# Patient Record
Sex: Male | Born: 1955
Health system: Southern US, Community
[De-identification: ages and names within clinical notes are randomized; demographics above are authoritative.]

## PROBLEM LIST (undated history)

## (undated) DIAGNOSIS — R0989 Other specified symptoms and signs involving the circulatory and respiratory systems: Secondary | ICD-10-CM

## (undated) DIAGNOSIS — I6529 Occlusion and stenosis of unspecified carotid artery: Secondary | ICD-10-CM

## (undated) DIAGNOSIS — E785 Hyperlipidemia, unspecified: Secondary | ICD-10-CM

## (undated) DIAGNOSIS — I10 Essential (primary) hypertension: Secondary | ICD-10-CM

## (undated) DIAGNOSIS — E119 Type 2 diabetes mellitus without complications: Secondary | ICD-10-CM

## (undated) DIAGNOSIS — E669 Obesity, unspecified: Secondary | ICD-10-CM

## (undated) HISTORY — DX: Occlusion and stenosis of unspecified carotid artery: I65.29

## (undated) HISTORY — DX: Hyperlipidemia, unspecified: E78.5

## (undated) HISTORY — DX: Obesity, unspecified: E66.9

## (undated) HISTORY — DX: Essential (primary) hypertension: I10

## (undated) HISTORY — DX: Other specified symptoms and signs involving the circulatory and respiratory systems: R09.89

## (undated) HISTORY — DX: Type 2 diabetes mellitus without complications: E11.9

---

## 1987-03-09 HISTORY — PX: FRACTURE SURGERY: SHX138

## 1987-03-09 HISTORY — PX: OTHER SURGICAL HISTORY: SHX169

## 1987-03-09 HISTORY — PX: URINARY SURGERY: SHX2626

## 2000-12-13 ENCOUNTER — Other Ambulatory Visit: Admission: RE | Admit: 2000-12-13 | Discharge: 2000-12-13 | Payer: Self-pay | Admitting: Otolaryngology

## 2012-06-01 ENCOUNTER — Encounter: Payer: Self-pay | Admitting: Pharmacist

## 2012-06-01 ENCOUNTER — Ambulatory Visit (INDEPENDENT_AMBULATORY_CARE_PROVIDER_SITE_OTHER): Payer: BC Managed Care – PPO | Admitting: Pharmacist

## 2012-06-01 VITALS — BP 158/72 | HR 75 | Ht 73.0 in | Wt 307.0 lb

## 2012-06-01 DIAGNOSIS — E119 Type 2 diabetes mellitus without complications: Secondary | ICD-10-CM

## 2012-06-01 DIAGNOSIS — E785 Hyperlipidemia, unspecified: Secondary | ICD-10-CM

## 2012-06-01 NOTE — Progress Notes (Signed)
Diabetes Flow Sheet:  Visit 1  Chief Complaint:   Chief Complaint  Patient presents with  . Diabetes     Exam Polyuria:  neg Polydipsia:  neg Polyphagia:  Neg    BMI:  Body mass index is 40.51 kg/(m^2).   General Appearance:  obese Mood/Affect:  normal  Low fat/carbohydrate diet?  No Nicotine Abuse?  No Medication Compliance?  No Exercise?  No Alcohol Abuse?  No  Patient check BG at home twice a day  - didn't bring glucometer but reports BG is 170, 112, 203 recently   Last A1C 7.1% (03/22/12) Last Lipid Panel (03/22/12)       LDL - 111  HDL-48   TG-176   Medication Checklist: ACE Inhibitor/ARB?  Yes Lipid Lowering Agent?  No Aspirin?  Yes Oral Hypoglycemic Agent(s)?  Yes  Assessment: 1.  type 2 Diabetes.   2.  Blood Pressure Control.  Uncontrolled - questionable white coat HTN 3.  Lipid Control.  uncontrolled  Recommendations: 1.  Carbohydrate counting diet.  Patient is counseled extensively on carbohydrate counting, serving sizes, saturated fat intake and meal planning.  Patient is instructed to eat 3 meals a day and 3 small snacks.  Patient will supplement snacks based on physical activity. 2.  30 minutes of physical activity.  Patient is counseled to always carry glucose tablets, lifesavers, hard candies, etc., while exercising in case of hypoglycemic event. 3.  Patient is counseled on pathophysiology of diabetes and the risk of long-term complications.  Fasting blood glucose goals are 80-120mg /dL.  Post-prandial goals are < 180.  A1C goals < 7.0%. 4.  LDL goal of < 100, HDL > 40 and TG < 150 5.  Patient is counseled on proper use of glucometer and lancing device.  Patient is informed to check BG twice daily and how to respond to unsuitable results. 6.  Medication recommendations at this time are as follows:  Restart crestor 10mg  qd (gave samples for 6 weeks) 7.  Discusses both Bydureon and Invokana - will try diet changes for 4 weeks, if BG still elelvated then will add  one of these agents. RTC - 4 weeks - LABS DUE

## 2012-06-29 ENCOUNTER — Ambulatory Visit (INDEPENDENT_AMBULATORY_CARE_PROVIDER_SITE_OTHER): Payer: BC Managed Care – PPO | Admitting: Pharmacist

## 2012-06-29 VITALS — BP 152/80 | HR 75 | Ht 73.0 in | Wt 310.0 lb

## 2012-06-29 DIAGNOSIS — R7989 Other specified abnormal findings of blood chemistry: Secondary | ICD-10-CM

## 2012-06-29 DIAGNOSIS — E291 Testicular hypofunction: Secondary | ICD-10-CM

## 2012-06-29 DIAGNOSIS — E1169 Type 2 diabetes mellitus with other specified complication: Secondary | ICD-10-CM

## 2012-06-29 DIAGNOSIS — E119 Type 2 diabetes mellitus without complications: Secondary | ICD-10-CM

## 2012-06-29 DIAGNOSIS — E785 Hyperlipidemia, unspecified: Secondary | ICD-10-CM

## 2012-06-29 DIAGNOSIS — I10 Essential (primary) hypertension: Secondary | ICD-10-CM

## 2012-06-29 LAB — POCT GLYCOSYLATED HEMOGLOBIN (HGB A1C): Hemoglobin A1C: 7.6

## 2012-06-29 LAB — POCT UA - MICROALBUMIN: Microalbumin Ur, POC: NEGATIVE mg/dL

## 2012-06-29 MED ORDER — CANAGLIFLOZIN 100 MG PO TABS: 100.0000 mg | ORAL_TABLET | Freq: Every day | ORAL | Status: DC

## 2012-06-29 NOTE — Progress Notes (Signed)
Diabetes Follow-Up Visit Chief Complaint:   Chief Complaint  Patient presents with  . Diabetes     Filed Vitals:   06/29/12 0815  BP: 152/80  Pulse: 75    Per patient home BP readings are 130's/80's   HPI: Patient was last seen 06/01/2012 for uncontrolled type 2 DM.  We discussed diet changes.   We also discussed Invokana and Bydureon as possible add on medications if diet changes did not bring BG to goals.   Current Diabetes Medications:  glipizide (generic) 5mg  qam and Janumet 50/1000mg  1 tablet twice a day with meals  Exam Edema:  neg  Polyuria:  neg  Polydipsia:  neg Polyphagia:  neg  BMI:  Body mass index is 40.91 kg/(m^2).   Weight changes:  Increased by 3# General Appearance:  alert, oriented, no acute distress and obese Mood/Affect:  normal   Low fat/carbohydrate diet?  Yes - switched to sweet potatoes, increase in salads - uses thousand island dressing, limiting sweets and sugar intake.  Snacks on apples and peanuts. Nicotine Abuse?  No Medication Compliance?  Yes Exercise?  No Alcohol Abuse?  No  Home BG Monitoring:  Checking 1-2 times a day. Average:  160's High: 203  Low:  112    Lab Results  Component Value Date   HGBA1C 7.6 06/29/2012    No results found for this basename: MICROALBUR,  MALB24HUR    No results found for this basename: CHOL,  HDL,  LDLCALC,  LDLDIRECT,  TRIG,  CHOLHDL      Assessment: 1.  Diabetes.  Uncontrolled - better but still not at BG goals 2.  Blood Pressure.  Elevated in office but normal at home 3.  Lipids.  Lipid panel pending   Recommendations: 1.  Medication recommendations at this time are as follows:  Add invokana 100mg  1 tablet each morning. (recheck BMET in 1 month - if Scr normal will consider increase Invokana to 300mg  daily)   Continue JanuMet 50/1000mg  bid and glipizide 5mg  qam (will try to discontinue in future) 2.  Reviewed HBG goals:  Fasting 80-130 and 1-2 hour post prandial <180.  Patient is instructed to  check BG 2 times per day.    3.  BP goal < 140/80. 4.  LDL goal of < 100, HDL > 40 and TG < 150. 5.  Eye Exam yearly and Dental Exam every 6 months. 6.  Dietary recommendations:  Discussed choosing low fat dressing (vinegarettes) and other low fat foods.  Increase non starchy vegetables 7.  Physical Activity recommendations:  Try to increase - goal 30 -45 minutes daily   8.  Return to clinic in 4-6 wks   Time spent counseling patient:  30 minutes   Henrene Pastor, PharmD, CPP

## 2012-06-30 LAB — NMR LIPOPROFILE WITH LIPIDS
Cholesterol, Total: 113 mg/dL (ref <200)
HDL Particle Number: 29.2 umol/L — ABNORMAL LOW (ref >=30.5)
LDL (calc): 41 mg/dL (ref <100)
LP-IR Score: 49 — ABNORMAL HIGH (ref <=45)
Small LDL Particle Number: 296 nmol/L (ref <=527)

## 2012-06-30 LAB — BASIC METABOLIC PANEL WITH GFR
CO2: 29 mEq/L (ref 19–32)
Calcium: 9.5 mg/dL (ref 8.4–10.5)
Creat: 0.81 mg/dL (ref 0.50–1.35)
GFR, Est African American: >89 mL/min
Sodium: 137 mEq/L (ref 135–145)

## 2012-07-04 ENCOUNTER — Telehealth: Payer: Self-pay | Admitting: Pharmacist

## 2012-07-04 NOTE — Telephone Encounter (Signed)
Tried to call patient with lab result - LM for him to call office for results

## 2012-07-05 NOTE — Telephone Encounter (Signed)
Laboratory results given to patient

## 2012-07-28 ENCOUNTER — Other Ambulatory Visit (INDEPENDENT_AMBULATORY_CARE_PROVIDER_SITE_OTHER): Payer: BC Managed Care – PPO

## 2012-07-28 DIAGNOSIS — E119 Type 2 diabetes mellitus without complications: Secondary | ICD-10-CM

## 2012-07-28 NOTE — Progress Notes (Signed)
Patient in today for labs only. °

## 2012-08-08 ENCOUNTER — Telehealth: Payer: Self-pay | Admitting: Pharmacist

## 2012-08-08 ENCOUNTER — Other Ambulatory Visit (INDEPENDENT_AMBULATORY_CARE_PROVIDER_SITE_OTHER): Payer: BC Managed Care – PPO

## 2012-08-08 DIAGNOSIS — R7989 Other specified abnormal findings of blood chemistry: Secondary | ICD-10-CM

## 2012-08-08 NOTE — Progress Notes (Signed)
Our records show that patient had labs drawn 1 week ago but our laboratory can find nothing resulted.  [facility] is to call patient to have him RTC to have redrawn.

## 2012-08-08 NOTE — Progress Notes (Signed)
Patient came in for labs only.

## 2012-08-09 ENCOUNTER — Telehealth: Payer: Self-pay | Admitting: Pharmacist

## 2012-08-09 DIAGNOSIS — E119 Type 2 diabetes mellitus without complications: Secondary | ICD-10-CM

## 2012-08-09 LAB — BASIC METABOLIC PANEL WITH GFR
BUN: 17 mg/dL (ref 6–23)
Chloride: 96 mEq/L (ref 96–112)
GFR, Est Non African American: 71 mL/min
Potassium: 3.8 mEq/L (ref 3.5–5.3)
Sodium: 139 mEq/L (ref 135–145)

## 2012-08-09 MED ORDER — CANAGLIFLOZIN 100 MG PO TABS: 100.0000 mg | ORAL_TABLET | Freq: Every day | ORAL | Status: DC

## 2012-08-09 NOTE — Telephone Encounter (Signed)
Results of BMP discussed with patient.  He reports that BG has improved since starting Invokana 100mg  daily.  Will continue with Invokana 100mg  daily for now - in future may increase to 300mg  daily.

## 2012-08-09 NOTE — Telephone Encounter (Signed)
Patient called regarding laboratory results.

## 2012-09-03 ENCOUNTER — Other Ambulatory Visit: Payer: Self-pay | Admitting: Family Medicine

## 2012-09-19 ENCOUNTER — Other Ambulatory Visit: Payer: Self-pay | Admitting: *Deleted

## 2012-09-19 MED ORDER — LOSARTAN POTASSIUM-HCTZ 100-25 MG PO TABS: 1.0000 | ORAL_TABLET | Freq: Every day | ORAL | Status: DC

## 2012-09-19 NOTE — Telephone Encounter (Signed)
LAST OV 4/14 WITH PHARMACIST.

## 2012-10-04 ENCOUNTER — Other Ambulatory Visit: Payer: Self-pay | Admitting: Family Medicine

## 2012-10-05 ENCOUNTER — Ambulatory Visit (INDEPENDENT_AMBULATORY_CARE_PROVIDER_SITE_OTHER): Payer: BC Managed Care – PPO | Admitting: Pharmacist

## 2012-10-05 VITALS — BP 160/82 | HR 76 | Ht 73.0 in | Wt 308.0 lb

## 2012-10-05 DIAGNOSIS — E119 Type 2 diabetes mellitus without complications: Secondary | ICD-10-CM

## 2012-10-05 DIAGNOSIS — E785 Hyperlipidemia, unspecified: Secondary | ICD-10-CM

## 2012-10-05 DIAGNOSIS — E1169 Type 2 diabetes mellitus with other specified complication: Secondary | ICD-10-CM

## 2012-10-05 DIAGNOSIS — I10 Essential (primary) hypertension: Secondary | ICD-10-CM

## 2012-10-05 LAB — COMPLETE METABOLIC PANEL WITH GFR
ALT: 42 U/L (ref 0–53)
AST: 25 U/L (ref 0–37)
Alkaline Phosphatase: 64 U/L (ref 39–117)
Calcium: 9 mg/dL (ref 8.4–10.5)
Chloride: 98 mEq/L (ref 96–112)
Creat: 1.11 mg/dL (ref 0.50–1.35)
Total Bilirubin: 0.9 mg/dL (ref 0.3–1.2)

## 2012-10-05 LAB — POCT GLYCOSYLATED HEMOGLOBIN (HGB A1C): Hemoglobin A1C: 6.6

## 2012-10-05 NOTE — Progress Notes (Signed)
Diabetes Follow-Up Visit Chief Complaint:   Chief Complaint  Patient presents with  . Diabetes     Filed Vitals:   10/05/12 0912  BP: 160/82  Pulse: 76    Per patient home BP readings are 130's/ 70 - 80's   HPI: Patient was last seen April 2014 for uncontrolled type 2 DM.  We discussed diet changes and started Invokana 100mg  daily.   Current Diabetes Medications:  glipizide (generic) 5mg  qam, Invokana 100mg  daily and Janumet 50/1000mg  1 tablet twice a day with meals  Exam Edema:  neg  Polyuria:  neg  Polydipsia:  neg Polyphagia:  neg  BMI:  Body mass index is 40.64 kg/(m^2).   Weight changes:  Decreased by 2# General Appearance:  alert, oriented, no acute distress and obese Mood/Affect:  normal   Low fat/carbohydrate diet?  Yes - switched to sweet potatoes, increase in salads and vegetables,  limiting sweets and sugar intake.  Snacks on apples, banana and peanuts. Nicotine Abuse?  No Medication Compliance?  Yes Exercise?  No Alcohol Abuse?  No  Home BG Monitoring:  Checking 1-2 times a day. Average:  130's High: 180  Low:  85 Denies any episodes of hypoglycemia    Lab Results  Component Value Date   HGBA1C 6.6 10/05/2012    Last urine microabumin was normal on 06/29/12  Checking Lipomed today - pending   Assessment: 1.  Diabetes.  A1c at goal but am BG still a little elevated 2.  Blood Pressure.  Elevated in office but normal at home 3.  Lipids.  Lipid panel pending   Recommendations: 1.  Medication recommendations at this time are as follows:  Increase invokana to 150mg  (take 1/2 tablet of 300mg  daily) Continue JanuMet 50/1000mg  bid and glipizide 5mg  qam (will try to discontinue in future) 2.  Reviewed HBG goals:  Fasting 80-130 and 1-2 hour post prandial <180.  Patient is instructed to check BG 1 time per day.    3.  BP goal < 140/80.  Patient to bring in his BP monitor for use to compare with our BP monitor.  If his monitor is found to be reporting correct  readings will do 24 hours Ambulatory BP monitoring.  4.  LDL goal of < 100, HDL > 40 and TG < 150. 5.  Eye Exam yearly and Dental Exam every 6 months. 6.  Dietary recommendations: Continue to limit CHO intake 7.  Physical Activity recommendations:  Try to increase - Discussed getting a bike.   goal 30 -45 minutes daily   8.  Return to clinic in 3 months to see PCP  RTC in 1 month to recheck BP  Orders Placed This Encounter  Procedures  . NMR Lipoprofile with Lipids  . COMPLETE METABOLIC PANEL WITH GFR  . POCT glycosylated hemoglobin (Hb A1C)    Time spent counseling patient:  40 minutes   Henrene Pastor, PharmD, CPP

## 2012-10-06 ENCOUNTER — Other Ambulatory Visit: Payer: Self-pay | Admitting: *Deleted

## 2012-10-06 LAB — NMR LIPOPROFILE WITH LIPIDS
Cholesterol, Total: 95 mg/dL (ref <200)
HDL Size: 9.4 nm (ref >=9.2)
LDL (calc): 30 mg/dL (ref <100)
LDL Particle Number: 582 nmol/L (ref <1000)
LP-IR Score: 55 — ABNORMAL HIGH (ref <=45)
Small LDL Particle Number: 432 nmol/L (ref <=527)
VLDL Size: 49.3 nm — ABNORMAL HIGH (ref <=46.6)

## 2012-10-06 MED ORDER — GLIPIZIDE 5 MG PO TABS: 5.0000 mg | ORAL_TABLET | Freq: Every morning | ORAL | Status: DC

## 2012-10-12 ENCOUNTER — Encounter: Payer: Self-pay | Admitting: Pharmacist

## 2012-11-16 ENCOUNTER — Telehealth: Payer: Self-pay | Admitting: Pharmacist

## 2012-11-16 MED ORDER — CANAGLIFLOZIN 300 MG PO TABS: 1.0000 | ORAL_TABLET | Freq: Every day | ORAL | Status: DC

## 2012-11-16 NOTE — Telephone Encounter (Signed)
Results were release by My Chart to patient

## 2012-12-06 ENCOUNTER — Encounter: Payer: Self-pay | Admitting: Family Medicine

## 2012-12-06 ENCOUNTER — Ambulatory Visit (INDEPENDENT_AMBULATORY_CARE_PROVIDER_SITE_OTHER): Payer: BC Managed Care – PPO | Admitting: Family Medicine

## 2012-12-06 ENCOUNTER — Ambulatory Visit (HOSPITAL_COMMUNITY)
Admission: RE | Admit: 2012-12-06 | Discharge: 2012-12-06 | Disposition: A | Discharge status: Home / Self Care | Payer: BC Managed Care – PPO | Source: Ambulatory Visit | Attending: Family Medicine | Admitting: Family Medicine

## 2012-12-06 ENCOUNTER — Other Ambulatory Visit: Payer: Self-pay

## 2012-12-06 VITALS — BP 144/79 | HR 78 | Temp 99.4°F | Ht 73.0 in | Wt 305.0 lb

## 2012-12-06 DIAGNOSIS — I10 Essential (primary) hypertension: Secondary | ICD-10-CM | POA: Insufficient documentation

## 2012-12-06 DIAGNOSIS — R0989 Other specified symptoms and signs involving the circulatory and respiratory systems: Secondary | ICD-10-CM

## 2012-12-06 DIAGNOSIS — I6529 Occlusion and stenosis of unspecified carotid artery: Secondary | ICD-10-CM | POA: Insufficient documentation

## 2012-12-06 DIAGNOSIS — Z87891 Personal history of nicotine dependence: Secondary | ICD-10-CM | POA: Insufficient documentation

## 2012-12-06 DIAGNOSIS — I658 Occlusion and stenosis of other precerebral arteries: Secondary | ICD-10-CM | POA: Insufficient documentation

## 2012-12-06 DIAGNOSIS — E785 Hyperlipidemia, unspecified: Secondary | ICD-10-CM | POA: Insufficient documentation

## 2012-12-06 DIAGNOSIS — E119 Type 2 diabetes mellitus without complications: Secondary | ICD-10-CM | POA: Insufficient documentation

## 2012-12-06 NOTE — Progress Notes (Signed)
  Subjective:    Patient ID: Steva Ready, male    DOB: Jul 25, 1955, 57 y.o.   MRN: [id number]  HPI  This 57 y.o. 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 He was advised to f/u with his pcp and have w/u performed.  Review of Systems No chest pain, SOB, HA, dizziness, vision change, N/V, diarrhea, constipation, dysuria, urinary urgency or frequency, myalgias, arthralgias or rash.     Objective:   Physical Exam  Vital signs noted  Well developed well nourished male.  HEENT - Head atraumatic Normocephalic                Eyes - PERRLA, Conjuctiva - clear Sclera- Clear EOMI                Ears - EAC's Wnl TM's Wnl Gross Hearing WNL                Nose - Nares patent                 Throat - oropharanx wnl Respiratory - Lungs CTA bilateral Cardiac - RRR S1 and S2 without murmur GI - Abdomen soft Nontender and bowel sounds active x 4 Extremities - No edema. Neuro - Grossly intact.      Assessment & Plan:  Carotid bruit - Plan: US Carotid Duplex Bilateral, CANCELED: US Carotid Duplex Bilateral Explained to patient that bruit was not found on today exam but this doesn't mean he doesn't have one and  since it was heard by another provider we will order the US of the carotids and go from there .  Deatra Canter FNP Will get

## 2012-12-06 NOTE — Patient Instructions (Signed)
Calorie Counting Diet A calorie counting diet requires you to eat the number of calories that are right for you in a day. Calories are the measurement of how much energy you get from the food you eat. Eating the right amount of calories is important for staying at a healthy weight. If you eat too many calories, your body will store them as fat and you may gain weight. If you eat too few calories, you may lose weight. Counting the number of calories you eat during a day will help you know if you are eating the right amount. A Registered Dietitian can determine how many calories you need in a day. The amount of calories needed varies from person to person. If your goal is to lose weight, you will need to eat fewer calories. Losing weight can benefit you if you are overweight or have health problems such as heart disease, high blood pressure, or diabetes. If your goal is to gain weight, you will need to eat more calories. Gaining weight may be necessary if you have a certain health problem that causes your body to need more energy. TIPS Whether you are increasing or decreasing the number of calories you eat during a day, it may be hard to get used to changes in what you eat and drink. The following are tips to help you keep track of the number of calories you eat.  Measure foods at home with measuring cups. This helps you know the amount of food and number of calories you are eating.  Restaurants often serve food in amounts that are larger than 1 serving. While eating out, estimate how many servings of a food you are given. For example, a serving of cooked rice is  cup or about the size of half of a fist. Knowing serving sizes will help you be aware of how much food you are eating at restaurants.  Ask for smaller portion sizes or child-size portions at restaurants.  Plan to eat half of a meal at a restaurant. Take the rest home or share the other half with a friend.  Read the Nutrition Facts panel on  food labels for calorie content and serving size. You can find out how many servings are in a package, the size of a serving, and the number of calories each serving has.  For example, a package might contain 3 cookies. The Nutrition Facts panel on that package says that 1 serving is 1 cookie. Below that, it will say there are 3 servings in the container. The calories section of the Nutrition Facts label says there are 90 calories. This means there are 90 calories in 1 cookie (1 serving). If you eat 1 cookie you have eaten 90 calories. If you eat all 3 cookies, you have eaten 270 calories (3 servings x 90 calories = 270 calories). The list below tells you how big or small some common portion sizes are.  1 oz.........4 stacked dice.  3 oz.........Deck of cards.  1 tsp........Tip of little finger.  1 tbs........Thumb.  2 tbs........Golf ball.   cup.......Half of a fist.  1 cup........A fist. KEEP A FOOD LOG Write down every food item you eat, the amount you eat, and the number of calories in each food you eat during the day. At the end of the day, you can add up the total number of calories you have eaten. It may help to keep a list like the one below. Find out the calorie information by reading the   Nutrition Facts panel on food labels. Breakfast  Bran cereal (1 cup, 110 calories).  Fat-free milk ( cup, 45 calories). Snack  Apple (1 medium, 80 calories). Lunch  Spinach (1 cup, 20 calories).  Tomato ( medium, 20 calories).  Chicken breast strips (3 oz, 165 calories).  Shredded cheddar cheese ( cup, 110 calories).  Light Italian dressing (2 tbs, 60 calories).  Whole-wheat bread (1 slice, 80 calories).  Tub margarine (1 tsp, 35 calories).  Vegetable soup (1 cup, 160 calories). Dinner  Pork chop (3 oz, 190 calories).  Brown rice (1 cup, 215 calories).  Steamed broccoli ( cup, 20 calories).  Strawberries (1  cup, 65 calories).  Whipped cream (1 tbs, 50  calories). Daily Calorie Total: 1425 Document Released: 02/22/2005 Document Revised: 05/17/2011 Document Reviewed: 08/19/2006 [employer] Patient Information 2014 [employer].  

## 2012-12-07 ENCOUNTER — Other Ambulatory Visit: Payer: Self-pay | Admitting: Family Medicine

## 2012-12-07 DIAGNOSIS — I6529 Occlusion and stenosis of unspecified carotid artery: Secondary | ICD-10-CM

## 2012-12-08 ENCOUNTER — Telehealth: Payer: Self-pay | Admitting: Family Medicine

## 2012-12-08 ENCOUNTER — Ambulatory Visit: Payer: Self-pay | Admitting: Family Medicine

## 2012-12-08 NOTE — Telephone Encounter (Signed)
Pt notified of results Verbalizes understanding 

## 2012-12-08 NOTE — Telephone Encounter (Signed)
Message copied by Bearl Mulberry on Fri Dec 08, 2012  4:39 PM ------      Message from: Deatra Canter      Created: Thu Dec 07, 2012  8:34 AM       Carotid artery US shows less than 50% stenosis bilateral at bifurcations bilateral internal carotids so will need to see vascular surgeon. Will send in order for Vascular for ASAP. ------

## 2012-12-11 ENCOUNTER — Encounter: Payer: Self-pay | Admitting: Vascular Surgery

## 2012-12-12 ENCOUNTER — Ambulatory Visit (INDEPENDENT_AMBULATORY_CARE_PROVIDER_SITE_OTHER): Payer: BC Managed Care – PPO | Admitting: Vascular Surgery

## 2012-12-12 ENCOUNTER — Encounter: Payer: Self-pay | Admitting: Vascular Surgery

## 2012-12-12 ENCOUNTER — Other Ambulatory Visit: Payer: Self-pay

## 2012-12-12 VITALS — BP 146/83 | HR 84 | Resp 18 | Ht 73.0 in | Wt 304.9 lb

## 2012-12-12 DIAGNOSIS — R0989 Other specified symptoms and signs involving the circulatory and respiratory systems: Secondary | ICD-10-CM

## 2012-12-12 MED ORDER — LOSARTAN POTASSIUM-HCTZ 100-25 MG PO TABS: 1.0000 | ORAL_TABLET | Freq: Every day | ORAL | Status: DC

## 2012-12-12 NOTE — Progress Notes (Signed)
Subjective:     Patient ID: Eugene Bautista, male   DOB: 08/05/1955, 57 y.o.   MRN: [id number]  HPI this 57 year old male is referred by Dr. Rudi Heap for possible carotid occlusive disease. Patient was found to have a carotid bruit recently a carotid duplex exam was performed. This revealed mild bilateral carotid stenosis less than 50% bilaterally performed on 12/06/2012. Patient denies any neurologic symptoms such as previous stroke, lateralizing weakness, aphasia, amaurosis fugax, diplopia, blurred vision, or syncope. He does take one aspirin per day. He smoked one pack of cigarettes per day for 20+ years but has not smoked in the past 14 years.  Past Medical History  Diagnosis Date  . Hyperlipidemia   . Hypertension   . Left carotid bruit   . Diabetes mellitus without complication     History  Substance Use Topics  . Smoking status: Former Smoker    Types: Cigarettes    Quit date: 03/08/2001  . Smokeless tobacco: Never Used  . Alcohol Use: 3.6 oz/week    6 Cans of beer per week    Family History  Problem Relation Age of Onset  . Dementia Mother   . Heart attack Father   . Kidney disease Father   . Cancer Brother     Allergies  Allergen Reactions  . Atorvastatin     myalgias    Current outpatient prescriptions:amLODipine (NORVASC) 10 MG tablet, Take 10 mg by mouth every morning., Disp: , Rfl: ;  aspirin 81 MG tablet, Take 81 mg by mouth daily., Disp: , Rfl: ;  Canagliflozin (INVOKANA) 300 MG TABS, Take 1 tablet (300 mg total) by mouth daily., Disp: 30 tablet, Rfl: 1;  cholecalciferol (VITAMIN D) 1000 UNITS tablet, Take 2,000 Units by mouth daily., Disp: , Rfl:  fish oil-omega-3 fatty acids 1000 MG capsule, Take 1 g by mouth 2 (two) times daily., Disp: , Rfl: ;  glipiZIDE (GLUCOTROL) 5 MG tablet, Take 1 tablet (5 mg total) by mouth every morning., Disp: 30 tablet, Rfl: 2;  JANUMET 50-1000 MG per tablet, TAKE 1 TAB TWICE DAILY, Disp: 60 tablet, Rfl: 2;   losartan-hydrochlorothiazide (HYZAAR) 100-25 MG per tablet, Take 1 tablet by mouth daily., Disp: 30 tablet, Rfl: 4 metoprolol succinate (TOPROL-XL) 25 MG 24 hr tablet, TAKE 1 TABLET BY MOUTH EVERY DAY, Disp: 30 tablet, Rfl: 3;  rosuvastatin (CRESTOR) 10 MG tablet, Take 10 mg by mouth at bedtime., Disp: , Rfl:   BP 146/83  Pulse 84  Resp 18  Ht 6\' 1"  (1.854 m)  Wt 304 lb 14.4 oz (138.302 kg)  BMI 40.24 kg/m2  Body mass index is 40.24 kg/(m^2).         Review of Systems denies chest pain, dyspnea on exertion, PND, orthopnea, hemoptysis, claudication. All other systems negative and complete review of systems     Objective:   Physical Exam BP 146/83  Pulse 84  Resp 18  Ht 6\' 1"  (1.854 m)  Wt 304 lb 14.4 oz (138.302 kg)  BMI 40.24 kg/m2  Gen.-alert and oriented x3 in no apparent distress HEENT normal for age Lungs no rhonchi or wheezing Cardiovascular regular rhythm no murmurs carotid pulses 3+ palpable no bruits audible Abdomen soft nontender no palpable masses Musculoskeletal free of  major deformities Skin clear -no rashes Neurologic normal Lower extremities 3+ femoral and dorsalis pedis pulses palpable bilaterally with no edema  Today I reviewed a carotid duplex exam results which were performed 12/06/2012 and I agree that this reveals that the  patient has bilateral mild flow reduction less than 50%     Assessment:     Bilateral mild carotid occlusive disease-less than 50% in severity-asymptomatic 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

## 2012-12-13 NOTE — Addendum Note (Signed)
Addended by: Adria Dill L on: 12/13/2012 03:14 PM   Modules accepted: Orders

## 2013-01-02 ENCOUNTER — Other Ambulatory Visit: Payer: Self-pay | Admitting: Family Medicine

## 2013-01-03 NOTE — Telephone Encounter (Signed)
LAST AIC 7.6 ON 06/29/12. NTBS

## 2013-01-05 ENCOUNTER — Ambulatory Visit: Payer: Self-pay | Admitting: Family Medicine

## 2013-01-05 ENCOUNTER — Other Ambulatory Visit: Payer: Self-pay | Admitting: *Deleted

## 2013-01-05 ENCOUNTER — Other Ambulatory Visit (INDEPENDENT_AMBULATORY_CARE_PROVIDER_SITE_OTHER): Payer: BC Managed Care – PPO

## 2013-01-05 DIAGNOSIS — E559 Vitamin D deficiency, unspecified: Secondary | ICD-10-CM

## 2013-01-05 DIAGNOSIS — E785 Hyperlipidemia, unspecified: Secondary | ICD-10-CM

## 2013-01-05 DIAGNOSIS — Z79899 Other long term (current) drug therapy: Secondary | ICD-10-CM

## 2013-01-05 DIAGNOSIS — IMO0001 Reserved for inherently not codable concepts without codable children: Secondary | ICD-10-CM

## 2013-01-05 DIAGNOSIS — I10 Essential (primary) hypertension: Secondary | ICD-10-CM

## 2013-01-05 LAB — POCT UA - MICROALBUMIN: Microalbumin Ur, POC: NEGATIVE mg/L

## 2013-01-05 LAB — POCT GLYCOSYLATED HEMOGLOBIN (HGB A1C): Hemoglobin A1C: 6.4

## 2013-01-05 MED ORDER — ROSUVASTATIN CALCIUM 10 MG PO TABS: 10.0000 mg | ORAL_TABLET | Freq: Every day | ORAL | Status: DC

## 2013-01-05 MED ORDER — AMLODIPINE BESYLATE 10 MG PO TABS: 10.0000 mg | ORAL_TABLET | Freq: Every morning | ORAL | Status: DC

## 2013-01-05 MED ORDER — SITAGLIPTIN PHOS-METFORMIN HCL 50-1000 MG PO TABS: 1.0000 | ORAL_TABLET | Freq: Two times a day (BID) | ORAL | Status: DC

## 2013-01-05 MED ORDER — CANAGLIFLOZIN 300 MG PO TABS: 1.0000 | ORAL_TABLET | Freq: Every day | ORAL | Status: DC

## 2013-01-05 MED ORDER — GLIPIZIDE 5 MG PO TABS: 5.0000 mg | ORAL_TABLET | Freq: Every morning | ORAL | Status: DC

## 2013-01-05 MED ORDER — LOSARTAN POTASSIUM-HCTZ 100-25 MG PO TABS: 1.0000 | ORAL_TABLET | Freq: Every day | ORAL | Status: DC

## 2013-01-05 MED ORDER — METOPROLOL SUCCINATE ER 25 MG PO TB24: 25.0000 mg | ORAL_TABLET | Freq: Every day | ORAL | Status: DC

## 2013-01-05 NOTE — Progress Notes (Signed)
Labs only

## 2013-01-05 NOTE — Progress Notes (Signed)
Patient will schedule a follow-up appt at the end of November when he is off of work again.

## 2013-01-07 ENCOUNTER — Other Ambulatory Visit: Payer: Self-pay | Admitting: Family Medicine

## 2013-01-07 LAB — BMP8+EGFR
BUN/Creatinine Ratio: 16 (ref 9–20)
BUN: 15 mg/dL (ref 6–24)
CO2: 24 mmol/L (ref 18–29)
Calcium: 9.8 mg/dL (ref 8.7–10.2)
Chloride: 95 mmol/L — ABNORMAL LOW (ref 97–108)
Creatinine, Ser: 0.92 mg/dL (ref 0.76–1.27)
GFR calc Af Amer: 108 mL/min/{1.73_m2} (ref >59)
GFR calc non Af Amer: 93 mL/min/{1.73_m2} (ref >59)
Glucose: 130 mg/dL — ABNORMAL HIGH (ref 65–99)
Potassium: 4 mmol/L (ref 3.5–5.2)
Sodium: 138 mmol/L (ref 134–144)

## 2013-01-07 LAB — NMR, LIPOPROFILE
Cholesterol: 101 mg/dL (ref <200)
HDL Cholesterol by NMR: 36 mg/dL — ABNORMAL LOW (ref >=40)
HDL Particle Number: 30.1 umol/L — ABNORMAL LOW (ref >=30.5)
LDL Particle Number: <300 nmol/L (ref <1000)
LDL Size: 19.7 nm — ABNORMAL LOW (ref >20.5)
LDLC SERPL CALC-MCNC: 25 mg/dL (ref <100)
LP-IR Score: 53 — ABNORMAL HIGH (ref <=45)
Small LDL Particle Number: 196 nmol/L (ref <=527)
Triglycerides by NMR: 202 mg/dL — ABNORMAL HIGH (ref <150)

## 2013-01-07 LAB — HEPATIC FUNCTION PANEL
ALT: 32 IU/L (ref 0–44)
AST: 22 IU/L (ref 0–40)
Albumin: 4.5 g/dL (ref 3.5–5.5)
Alkaline Phosphatase: 75 IU/L (ref 39–117)
Bilirubin, Direct: 0.23 mg/dL (ref 0.00–0.40)
Total Bilirubin: 0.7 mg/dL (ref 0.0–1.2)
Total Protein: 6.8 g/dL (ref 6.0–8.5)

## 2013-01-07 LAB — VITAMIN D 25 HYDROXY (VIT D DEFICIENCY, FRACTURES): Vit D, 25-Hydroxy: 40.3 ng/mL (ref 30.0–100.0)

## 2013-01-11 ENCOUNTER — Other Ambulatory Visit: Payer: Self-pay

## 2013-01-19 ENCOUNTER — Other Ambulatory Visit: Payer: Self-pay | Admitting: Family Medicine

## 2013-01-29 ENCOUNTER — Ambulatory Visit (INDEPENDENT_AMBULATORY_CARE_PROVIDER_SITE_OTHER): Payer: BC Managed Care – PPO | Admitting: Family Medicine

## 2013-01-29 ENCOUNTER — Encounter: Payer: Self-pay | Admitting: Family Medicine

## 2013-01-29 DIAGNOSIS — I6529 Occlusion and stenosis of unspecified carotid artery: Secondary | ICD-10-CM

## 2013-01-29 NOTE — Patient Instructions (Signed)
Atherosclerosis  Atherosclerosis, or hardening of the arteries, is the buildup of plaque within the major arteries in the body. Plaque is made up of fats (lipids), cholesterol, calcium, and fibrous tissue. Plaque can narrow or block blood flow within an artery. Plaque can break off and cause damage to the affected organ. Plaque can also "rupture." When plaque ruptures within an artery, a clot can form, causing a sudden (acute) blockage of the artery. Untreated atherosclerosis can cause serious health problems or death.   COMMON ATHEROSCLEROSIS RISK FACTORS  · High cholesterol levels.  · Smoking.  · Obesity.  · Lack of activity or exercise.  · Eating a diet high in saturated fat.  · Family history.  · Diabetes.  SYMPTOMS   Symptoms of atherosclerosis can occur when blood flow to an artery is slowed or blocked. Severity and onset of symptoms depends on how extensive the narrowing or blockage is. A sudden plaque rupture can bring immediate, life-threatening symptoms. Atherosclerosis can affect different arteries in the body, for example:  · Coronary arteries. The coronary arteries supply the heart with blood. When the coronary arteries are narrowed or blocked from atherosclerosis, this is known as coronary artery disease (CAD). CAD can cause a heart attack. Common heart attack symptoms include:  · Chest pain or pain that radiates to the neck, arm, jaw, or in the upper, middle back (mid-scapular pain).  · Shortness of breath without cause.  · Profuse sweating while at rest.  · Irregular heartbeats.  · Nausea or gastrointestinal upset.  · Carotid arteries. The carotid arteries supply the brain with blood. They are located on each side of your neck. When blood flow to these arteries is slowed or blocked, a transiant ischemic attack (TIA) or stroke can occur. A TIA is considered a "mini-stroke" or "warning stroke." TIA symptoms are the same as stroke symptoms, but they are temporary and last less than 24 hours. A stroke  can cause permanent damage or death. Common TIA and stroke symptoms include:  · Sudden numbness or weakness to one side of your body, such as the face, arm, or leg.  · Sudden confusion or trouble speaking or understanding.  · Sudden trouble seeing out of one or both eyes.  · Sudden trouble walking, loss of balance, or dizziness.  · Sudden, severe headache with no known cause.  · Arteries in the legs. When arteries in the lower legs become narrowed or blocked, this is known as peripheral vascular disease(PVD). PVD can cause a symptom called claudication. Claudication is pain or a burning feeling in your legs when walking or exercising and usually goes away with rest. Very severe PVD can cause pain in your legs while at rest.  · Renal arteries. The renal arteries supply the kidneys with blood. Blockage of the renal arteries can cause a decline in kidney function or high blood pressure (hypertension).  · Gastrointestinal arteries (mesenteric circulation). Abdominal pain may occur after eating.  DIAGNOSIS   Your caregiver may perform the following tests to diagnose atherosclerosis:  · Blood tests.  · Stress Test.  · Echocardiogram.  · Nuclear scan.  · Ankle/brachial index.  · Ultrasonography.  · Computed tomography (CT) scan.  · Angiography.  TREATMENT   Atherosclerosis treatment includes the following:  · Lifestyle changes such as:  · Quitting smoking. Your caregiver can help you with smoking cessation.  · Eat a diet low in saturated fat. A registered dietician can educate you on healthy food options such as helping you   understand the difference between good fat and bad fat.  · Following an exercise program approved by your caregiver.  · Maintaining a healthy weight. Lose weight as approved by your caregiver.  · Have your cholesterol levels checked as directed by your caregiver.  · Medicines. Cholesterol medicines can help slow or stop the progression of atherosclerosis.  · Different procedural or surgical  interventions to treat atherosclerosis include:  · Balloon angioplasty. The technical name for balloon angioplasty is called percutaneous transluminal angioplasty(PTA). In this procedure, a catheter with a small balloon at the tip is inserted through the blocked or narrowed artery. The balloon is then inflated. When the balloon is inflated, the fatty plaque is compressed against the artery wall, allowing better blood flow within the artery.  · Balloon angioplasty and stenting. In this procedure, balloon angioplasty is combined with a stenting procedure. A stent is a small, metal mesh tube that keeps the artery open. After the artery is opened up by the balloon technique, the stent is then deployed. The stent is permanent.  · Open heart surgery or bypass surgery. To perform this type of surgery, a healthy vessel is first "harvested" from either the leg or arm. The harvested vessel is then used to "bypass" the blocked atherosclerotic vessel so new blood flow can be established.  · Atherectomy. Atherectomy is a procedure that uses a catheter with a sharp blade to remove plaque from an artery. A chamber in the catheter collects the plaque.  · Endarterectomy. An endarterectomy is a surgical procedure where a surgeon removes plaque from an artery.  · Amputation. When blockages in the lower legs are very severe and circulation cannot be restored, amputation may be required.  SEEK IMMEDIATE MEDICAL CARE IF:  · You are having heart attack symptoms, such as:  · Chest pain or pain that radiates to the neck, arm, jaw, or in the upper, middle back (mid-scapular pain).  · Shortness of breath without cause.  · Profuse sweating while at rest.  · Irregular heartbeats.  · Nausea or gastrointestinal upset.  · You are having stroke symptoms, such as sudden:  · Numbness or weakness to one side of your body, such as the face, arm, or leg.  · Confusion or trouble speaking or understanding.  · Trouble seeing out of one or both  eyes.  · Trouble walking, loss of balance, or dizziness.  · Severe headache with no known cause.  · Your hands or feet are bluish, cold, or you have pain in them.  · You have bad abdominal pain after eating.  Seek help immediately if you have heart attack or stroke symptoms. Do not drive yourself to the hospital. Call your local emergency service immediately! Do not wait to see if these symptoms go away:  Document Released: 05/15/2003 Document Revised: 08/24/2011 Document Reviewed: 04/27/2011  ExitCare® Patient Information ©2014 [employer].

## 2013-01-29 NOTE — Progress Notes (Signed)
  Subjective:    Patient ID: Eugene Bautista, male    DOB: August 28, 1955, 57 y.o.   MRN: [id number]  HPI  This 57 y.o. male presents for evaluation of hypertension, diabetes, and carotid artery stenosis. He has recently seen vascular for carotid artery stenosis and was recommended to have Surveillance..  Review of Systems    No chest pain, SOB, HA, dizziness, vision change, N/V, diarrhea, constipation, dysuria, urinary urgency or frequency, myalgias, arthralgias or rash.  Objective:   Physical Exam  Vital signs noted  Well developed well nourished male.  HEENT - Head atraumatic Normocephalic                Eyes - PERRLA, Conjuctiva - clear Sclera- Clear EOMI                Ears - EAC's Wnl TM's Wnl Gross Hearing WNL                Throat - oropharanx wnl Respiratory - Lungs CTA bilateral Cardiac - RRR S1 and S2 without murmur GI - Abdomen soft Nontender and bowel sounds active x 4 Extremities - No edema. Neuro - Grossly intact.      Assessment & Plan:  Carotid artery stenosis - Continue surveillance with vascular.  Discussed getting stress test  Or cardiac consult since he has family hx of cad and now has established ASCVD and needs to Be taking ASA 81 mg po qd which he is.  Will discuss getting stress test at next visit and he wants  To wait now.  He does not want colonoscopy or flu shot.  Deatra Canter FNP

## 2013-03-23 ENCOUNTER — Other Ambulatory Visit: Payer: Self-pay | Admitting: Family Medicine

## 2013-04-05 ENCOUNTER — Other Ambulatory Visit: Payer: Self-pay | Admitting: Family Medicine

## 2013-04-15 ENCOUNTER — Other Ambulatory Visit: Payer: Self-pay | Admitting: Family Medicine

## 2013-05-01 ENCOUNTER — Ambulatory Visit: Payer: BC Managed Care – PPO | Admitting: Family Medicine

## 2013-05-04 ENCOUNTER — Ambulatory Visit: Payer: BC Managed Care – PPO | Admitting: Family Medicine

## 2013-05-11 ENCOUNTER — Ambulatory Visit: Payer: BC Managed Care – PPO | Admitting: Family Medicine

## 2013-05-15 ENCOUNTER — Other Ambulatory Visit: Payer: Self-pay | Admitting: Family Medicine

## 2013-05-25 ENCOUNTER — Encounter: Payer: Self-pay | Admitting: Family Medicine

## 2013-05-25 ENCOUNTER — Ambulatory Visit (INDEPENDENT_AMBULATORY_CARE_PROVIDER_SITE_OTHER): Payer: BC Managed Care – PPO | Admitting: Family Medicine

## 2013-05-25 VITALS — BP 131/70 | HR 79 | Temp 98.7°F | Ht 73.0 in | Wt 303.4 lb

## 2013-05-25 DIAGNOSIS — E119 Type 2 diabetes mellitus without complications: Secondary | ICD-10-CM

## 2013-05-25 DIAGNOSIS — R5381 Other malaise: Secondary | ICD-10-CM

## 2013-05-25 DIAGNOSIS — E785 Hyperlipidemia, unspecified: Secondary | ICD-10-CM

## 2013-05-25 DIAGNOSIS — I10 Essential (primary) hypertension: Secondary | ICD-10-CM

## 2013-05-25 DIAGNOSIS — R5383 Other fatigue: Secondary | ICD-10-CM

## 2013-05-25 LAB — POCT CBC
Granulocyte percent: 76.4 %G (ref 37–80)
HCT, POC: 52.6 % (ref 43.5–53.7)
Hemoglobin: 17.1 g/dL (ref 14.1–18.1)
Lymph, poc: 1.5 (ref 0.6–3.4)
MCH, POC: 29.4 pg (ref 27–31.2)
MCHC: 32.4 g/dL (ref 31.8–35.4)
MCV: 90.8 fL (ref 80–97)
MPV: 8 fL (ref 0–99.8)
POC Granulocyte: 6.1 (ref 2–6.9)
POC LYMPH PERCENT: 19 %L (ref 10–50)
Platelet Count, POC: 209 10*3/uL (ref 142–424)
RBC: 5.8 M/uL (ref 4.69–6.13)
RDW, POC: 14 %
WBC: 8 10*3/uL (ref 4.6–10.2)

## 2013-05-25 LAB — POCT GLYCOSYLATED HEMOGLOBIN (HGB A1C): Hemoglobin A1C: 6.4

## 2013-05-25 MED ORDER — GLIPIZIDE 5 MG PO TABS: 5.0000 mg | ORAL_TABLET | Freq: Every morning | ORAL | Status: DC

## 2013-05-25 MED ORDER — CANAGLIFLOZIN 300 MG PO TABS: 1.0000 | ORAL_TABLET | Freq: Once | ORAL | Status: DC

## 2013-05-25 MED ORDER — SITAGLIPTIN PHOS-METFORMIN HCL 50-1000 MG PO TABS: 1.0000 | ORAL_TABLET | ORAL | Status: DC

## 2013-05-25 MED ORDER — AMLODIPINE BESYLATE 10 MG PO TABS: 10.0000 mg | ORAL_TABLET | Freq: Every day | ORAL | Status: DC

## 2013-05-25 MED ORDER — LOSARTAN POTASSIUM-HCTZ 100-25 MG PO TABS: 1.0000 | ORAL_TABLET | Freq: Every day | ORAL | Status: DC

## 2013-05-25 MED ORDER — ROSUVASTATIN CALCIUM 10 MG PO TABS: 10.0000 mg | ORAL_TABLET | Freq: Every day | ORAL | Status: DC

## 2013-05-25 MED ORDER — METOPROLOL SUCCINATE ER 25 MG PO TB24: 25.0000 mg | ORAL_TABLET | Freq: Every day | ORAL | Status: DC

## 2013-05-25 NOTE — Progress Notes (Signed)
   Subjective:    Patient ID: [name], male    DOB: [birth year], 57 y.o.   MRN: [id number]  HPI  This 58 y.o. male presents for evaluation of diabetes, hypertension, and hyperlipidemia. He has been having fsbs fasting running 68-160.  He needs an eye exam.  He has no c/o problems with feet..  Review of Systems No chest pain, SOB, HA, dizziness, vision change, N/V, diarrhea, constipation, dysuria, urinary urgency or frequency, myalgias, arthralgias or rash.     Objective:   Physical Exam Vital signs noted  Well developed well nourished male.  HEENT - Head atraumatic Normocephalic                Eyes - PERRLA, Conjuctiva - clear Sclera- Clear EOMI                Ears - EAC's Wnl TM's Wnl Gross Hearing WNL                Nose - Nares patent                 Throat - oropharanx wnl Respiratory - Lungs CTA bilateral Cardiac - RRR S1 and S2 without murmur GI - Abdomen soft Nontender and bowel sounds active x 4 Extremities - No edema. Neuro - Grossly intact. Foot exam - Foot exam normal bilateral  Results for orders placed in visit on 05/25/13  POCT CBC      Result Value Ref Range   WBC 8.0  4.6 - 10.2 K/uL   Lymph, poc 1.5  0.6 - 3.4   POC LYMPH PERCENT 19.0  10 - 50 %L   MID (cbc)    0 - 0.9   POC MID %    0 - 12 %M   POC Granulocyte 6.1  2 - 6.9   Granulocyte percent 76.4  37 - 80 %G   RBC 5.8  4.69 - 6.13 M/uL   Hemoglobin 17.1  14.1 - 18.1 g/dL   HCT, POC 52.6  43.5 - 53.7 %   MCV 90.8  80 - 97 fL   MCH, POC 29.4  27 - 31.2 pg   MCHC 32.4  31.8 - 35.4 g/dL   RDW, POC 14.0     Platelet Count, POC 209.0  142 - 424 K/uL   MPV 8.0  0 - 99.8 fL  POCT GLYCOSYLATED HEMOGLOBIN (HGB A1C)      Result Value Ref Range   Hemoglobin A1C 6.4        Assessment & Plan:  Hypertension - Plan: POCT CBC  Hyperlipemia - Plan: POCT CBC, CMP14+EGFR, Lipid panel  Diabetes mellitus, type 2 - Plan: POCT glycosylated hemoglobin (Hb A1C), CMP14+EGFR Diabetes is better and  continue current.    Fatigue - Plan: POCT CBC, Thyroid Panel With TSH  Lysbeth Penner FNP

## 2013-05-26 LAB — THYROID PANEL WITH TSH
Free Thyroxine Index: 2.3 (ref 1.2–4.9)
T3 Uptake Ratio: 31 % (ref 24–39)
T4, Total: 7.4 ug/dL (ref 4.5–12.0)
TSH: 1.14 u[IU]/mL (ref 0.450–4.500)

## 2013-05-26 LAB — CMP14+EGFR
ALT: 32 IU/L (ref 0–44)
AST: 23 IU/L (ref 0–40)
Albumin/Globulin Ratio: 1.8 (ref 1.1–2.5)
Albumin: 4.4 g/dL (ref 3.5–5.5)
Alkaline Phosphatase: 70 IU/L (ref 39–117)
BUN/Creatinine Ratio: 14 (ref 9–20)
BUN: 15 mg/dL (ref 6–24)
CO2: 24 mmol/L (ref 18–29)
Calcium: 9.4 mg/dL (ref 8.7–10.2)
Chloride: 96 mmol/L — ABNORMAL LOW (ref 97–108)
Creatinine, Ser: 1.04 mg/dL (ref 0.76–1.27)
GFR calc Af Amer: 92 mL/min/{1.73_m2} (ref >59)
GFR calc non Af Amer: 80 mL/min/{1.73_m2} (ref >59)
Globulin, Total: 2.5 g/dL (ref 1.5–4.5)
Glucose: 143 mg/dL — ABNORMAL HIGH (ref 65–99)
Potassium: 4 mmol/L (ref 3.5–5.2)
Sodium: 140 mmol/L (ref 134–144)
Total Bilirubin: 0.8 mg/dL (ref 0.0–1.2)
Total Protein: 6.9 g/dL (ref 6.0–8.5)

## 2013-05-26 LAB — LIPID PANEL
Chol/HDL Ratio: 2.8 ratio units (ref 0.0–5.0)
Cholesterol, Total: 116 mg/dL (ref 100–199)
HDL: 41 mg/dL (ref >39)
LDL Calculated: 49 mg/dL (ref 0–99)
Triglycerides: 128 mg/dL (ref 0–149)
VLDL Cholesterol Cal: 26 mg/dL (ref 5–40)

## 2013-07-15 ENCOUNTER — Other Ambulatory Visit: Payer: Self-pay | Admitting: Family Medicine

## 2013-07-23 ENCOUNTER — Telehealth: Payer: Self-pay | Admitting: *Deleted

## 2013-07-23 NOTE — Telephone Encounter (Signed)
Patient is suppose to be on janumet bid and rx was written for qd. Please advise

## 2013-07-24 NOTE — Telephone Encounter (Signed)
Spoke with pt and [place] Pt is to take RX bottle back to [facility] and they will correct Pt will NOT have to pay any additional copay or money

## 2013-08-06 ENCOUNTER — Ambulatory Visit (INDEPENDENT_AMBULATORY_CARE_PROVIDER_SITE_OTHER): Payer: BC Managed Care – PPO | Admitting: Family Medicine

## 2013-08-06 VITALS — BP 137/78 | HR 78 | Temp 98.5°F | Ht 73.0 in | Wt 301.8 lb

## 2013-08-06 DIAGNOSIS — E785 Hyperlipidemia, unspecified: Secondary | ICD-10-CM

## 2013-08-06 DIAGNOSIS — R5381 Other malaise: Secondary | ICD-10-CM

## 2013-08-06 DIAGNOSIS — R5383 Other fatigue: Secondary | ICD-10-CM

## 2013-08-06 DIAGNOSIS — R0789 Other chest pain: Secondary | ICD-10-CM

## 2013-08-06 DIAGNOSIS — E119 Type 2 diabetes mellitus without complications: Secondary | ICD-10-CM

## 2013-08-06 DIAGNOSIS — I10 Essential (primary) hypertension: Secondary | ICD-10-CM

## 2013-08-06 LAB — POCT CBC
Granulocyte percent: 79 %G (ref 37–80)
HCT, POC: 54.2 % — AB (ref 43.5–53.7)
Hemoglobin: 17.4 g/dL (ref 14.1–18.1)
Lymph, poc: 1.6 (ref 0.6–3.4)
MCH, POC: 28.8 pg (ref 27–31.2)
MCHC: 32.1 g/dL (ref 31.8–35.4)
MCV: 89.8 fL (ref 80–97)
MPV: 8 fL (ref 0–99.8)
POC Granulocyte: 6.9 (ref 2–6.9)
POC LYMPH PERCENT: 18.6 %L (ref 10–50)
Platelet Count, POC: 195 10*3/uL (ref 142–424)
RBC: 6 M/uL (ref 4.69–6.13)
RDW, POC: 13.9 %
WBC: 8.7 10*3/uL (ref 4.6–10.2)

## 2013-08-06 LAB — POCT GLYCOSYLATED HEMOGLOBIN (HGB A1C): Hemoglobin A1C: 6.7

## 2013-08-06 NOTE — Addendum Note (Signed)
Addended by: Orma Render F on: 08/06/2013 02:12 PM   Modules accepted: Orders

## 2013-08-06 NOTE — Progress Notes (Signed)
Subjective:    Patient ID: Eugene Bautista, male    DOB: 04/13/1955, 57 y.o.   MRN: [id number]  HPI This 57 y.o. male presents for evaluation of stomach twinges that radiate up form upper abdomen to his left neck.  He states he gets these at rest and not with activity.  He denies any chest pressure or pain.  He characterizes the discomfort or occurences as twinges that have been happening for 2 weeks.  He states he has had difficulty sleeping due to these episodes.  He has hx of DM.   Review of Systems C/o stomach twinges   No chest pain, SOB, HA, dizziness, vision change, N/V, diarrhea, constipation, dysuria, urinary urgency or frequency, myalgias, arthralgias or rash.  Objective:   Physical Exam  Vital signs noted  Well developed well nourished male.  HEENT - Head atraumatic Normocephalic                Eyes - PERRLA, Conjuctiva - clear Sclera- Clear EOMI                Ears - EAC's Wnl TM's Wnl Gross Hearing WNL                Nose - Nares patent                 Throat - oropharanx wnl Respiratory - Lungs CTA bilateral Cardiac - RRR S1 and S2 without murmur GI - Abdomen soft Nontender and bowel sounds active x 4 Extremities - No edema. Neuro - Grossly intact.  EKG - NSR without acute st-t changes    Assessment & Plan:  Atypical chest pain - Plan: Ambulatory referral to Cardiology, EKG 12-Lead  Diabetes - Plan: POCT glycosylated hemoglobin (Hb A1C), PSA, total and free  HTN (hypertension) - Plan: POCT CBC, CMP14+EGFR  Other and unspecified hyperlipidemia - Plan: Lipid panel  Other malaise and fatigue - Plan: TSH  Follow up in 4 months  Admir J Oxford FNP 

## 2013-08-06 NOTE — Addendum Note (Signed)
Addended by: Deatra Canter on: 08/06/2013 02:09 PM   Modules accepted: Orders

## 2013-08-07 LAB — CMP14+EGFR
ALT: 31 IU/L (ref 0–44)
AST: 19 IU/L (ref 0–40)
Albumin/Globulin Ratio: 1.8 (ref 1.1–2.5)
Albumin: 4.5 g/dL (ref 3.5–5.5)
Alkaline Phosphatase: 81 IU/L (ref 39–117)
BUN/Creatinine Ratio: 21 — ABNORMAL HIGH (ref 9–20)
BUN: 23 mg/dL (ref 6–24)
CO2: 25 mmol/L (ref 18–29)
Calcium: 9.3 mg/dL (ref 8.7–10.2)
Chloride: 95 mmol/L — ABNORMAL LOW (ref 97–108)
Creatinine, Ser: 1.11 mg/dL (ref 0.76–1.27)
GFR calc Af Amer: 85 mL/min/{1.73_m2} (ref >59)
GFR calc non Af Amer: 74 mL/min/{1.73_m2} (ref >59)
Globulin, Total: 2.5 g/dL (ref 1.5–4.5)
Glucose: 141 mg/dL — ABNORMAL HIGH (ref 65–99)
Potassium: 4.1 mmol/L (ref 3.5–5.2)
Sodium: 137 mmol/L (ref 134–144)
Total Bilirubin: 0.8 mg/dL (ref 0.0–1.2)
Total Protein: 7 g/dL (ref 6.0–8.5)

## 2013-08-07 LAB — LIPID PANEL
Chol/HDL Ratio: 3.1 ratio units (ref 0.0–5.0)
Cholesterol, Total: 133 mg/dL (ref 100–199)
HDL: 43 mg/dL (ref >39)
LDL Calculated: 56 mg/dL (ref 0–99)
Triglycerides: 169 mg/dL — ABNORMAL HIGH (ref 0–149)
VLDL Cholesterol Cal: 34 mg/dL (ref 5–40)

## 2013-08-07 LAB — PSA, TOTAL AND FREE
PSA, Free Pct: 36 %
PSA, Free: 0.18 ng/mL
PSA: 0.5 ng/mL (ref 0.0–4.0)

## 2013-08-07 LAB — TSH: TSH: 1.43 u[IU]/mL (ref 0.450–4.500)

## 2013-08-14 ENCOUNTER — Other Ambulatory Visit: Payer: Self-pay | Admitting: Family Medicine

## 2013-08-21 ENCOUNTER — Other Ambulatory Visit: Payer: Self-pay | Admitting: Family Medicine

## 2013-08-31 ENCOUNTER — Other Ambulatory Visit: Payer: Self-pay | Admitting: Family Medicine

## 2013-09-19 ENCOUNTER — Telehealth: Payer: Self-pay | Admitting: Family Medicine

## 2013-09-20 NOTE — Telephone Encounter (Signed)
Pt instructed to call to reschedule/cancel for a time convenient for him. Given the number for  [facility]

## 2013-10-23 ENCOUNTER — Encounter: Payer: Self-pay | Admitting: *Deleted

## 2013-10-24 ENCOUNTER — Ambulatory Visit (INDEPENDENT_AMBULATORY_CARE_PROVIDER_SITE_OTHER): Payer: BC Managed Care – PPO | Admitting: Cardiology

## 2013-10-24 ENCOUNTER — Encounter: Payer: Self-pay | Admitting: Cardiology

## 2013-10-24 VITALS — BP 143/88 | HR 86 | Ht 73.0 in | Wt 287.0 lb

## 2013-10-24 DIAGNOSIS — R072 Precordial pain: Secondary | ICD-10-CM

## 2013-10-24 DIAGNOSIS — I10 Essential (primary) hypertension: Secondary | ICD-10-CM

## 2013-10-24 DIAGNOSIS — Z136 Encounter for screening for cardiovascular disorders: Secondary | ICD-10-CM

## 2013-10-24 DIAGNOSIS — E119 Type 2 diabetes mellitus without complications: Secondary | ICD-10-CM

## 2013-10-24 NOTE — Patient Instructions (Signed)
The current medical regimen is effective;  continue present plan and medications.  Your physician has requested that you have an exercise tolerance test. For further information please visit [email or web address]. Please also follow instruction sheet, as given.  Follow up as needed. 
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
## 2013-11-20 ENCOUNTER — Ambulatory Visit: Payer: BC Managed Care – PPO | Admitting: Family

## 2013-11-20 ENCOUNTER — Other Ambulatory Visit (HOSPITAL_COMMUNITY): Payer: BC Managed Care – PPO

## 2013-11-26 ENCOUNTER — Encounter: Payer: Self-pay | Admitting: Family

## 2013-11-27 ENCOUNTER — Encounter: Payer: Self-pay | Admitting: Family

## 2013-11-27 ENCOUNTER — Ambulatory Visit (HOSPITAL_COMMUNITY)
Admission: RE | Admit: 2013-11-27 | Discharge: 2013-11-27 | Disposition: A | Discharge status: Home / Self Care | Payer: BC Managed Care – PPO | Source: Ambulatory Visit | Attending: Family | Admitting: Family

## 2013-11-27 ENCOUNTER — Ambulatory Visit (INDEPENDENT_AMBULATORY_CARE_PROVIDER_SITE_OTHER): Payer: BC Managed Care – PPO | Admitting: Family

## 2013-11-27 ENCOUNTER — Encounter: Payer: Self-pay | Admitting: Nurse Practitioner

## 2013-11-27 ENCOUNTER — Ambulatory Visit (INDEPENDENT_AMBULATORY_CARE_PROVIDER_SITE_OTHER): Payer: BC Managed Care – PPO | Admitting: Nurse Practitioner

## 2013-11-27 VITALS — BP 140/81 | HR 92 | Resp 16 | Ht 73.0 in | Wt 302.0 lb

## 2013-11-27 VITALS — BP 150/73 | HR 99

## 2013-11-27 DIAGNOSIS — R0989 Other specified symptoms and signs involving the circulatory and respiratory systems: Secondary | ICD-10-CM

## 2013-11-27 DIAGNOSIS — Z136 Encounter for screening for cardiovascular disorders: Secondary | ICD-10-CM

## 2013-11-27 DIAGNOSIS — M7989 Other specified soft tissue disorders: Secondary | ICD-10-CM

## 2013-11-27 DIAGNOSIS — M79609 Pain in unspecified limb: Secondary | ICD-10-CM

## 2013-11-27 DIAGNOSIS — R072 Precordial pain: Secondary | ICD-10-CM

## 2013-11-27 DIAGNOSIS — E119 Type 2 diabetes mellitus without complications: Secondary | ICD-10-CM

## 2013-11-27 DIAGNOSIS — I10 Essential (primary) hypertension: Secondary | ICD-10-CM

## 2013-11-27 DIAGNOSIS — M79605 Pain in left leg: Secondary | ICD-10-CM | POA: Insufficient documentation

## 2013-11-27 NOTE — Progress Notes (Signed)
Exercise Treadmill Test  Pre-Exercise Testing Evaluation Rhythm: sinus tachycardia  Rate: 93 bpm     Test  Exercise Tolerance Test Ordering MD: Angelina Sheriff, MD  Interpreting MD: Norma Fredrickson, NP  Unique Test No: 1  Treadmill:  1  Indication for ETT: chest pain - rule out ischemia  Contraindication to ETT: No   Stress Modality: exercise - treadmill  Cardiac Imaging Performed: non   Protocol: standard Bruce - maximal  Max BP:  198/82  Max MPHR (bpm):  164 85% MPR (bpm):  139  MPHR obtained (bpm):  146 % MPHR obtained:  89%  Reached 85% MPHR (min:sec):  3:28 Total Exercise Time (min-sec):  5 minutes  Workload in METS:  7.0 Borg Scale: 15  Reason ETT Terminated:  patient's desire to stop    ST Segment Analysis At Rest: normal ST segments - no evidence of significant ST depression With Exercise: borderline ST changes  Other Information Arrhythmia:  No Angina during ETT:  absent (0) Quality of ETT:  diagnostic  ETT Interpretation:  borderline (indeterminate) with non-specific ST changes  Comments: Patient presents today for routine GXT. Has had atypical chest pain. Does not exercise. Has HTN, HLD, DM and obesity. Metoprolol held today. Tells me he has had a ruptured aorta - with surgery per Dr. Edwyna Shell in 1989 - has large left thoracotomy incision.   Today the patient exercised on the standard Bruce protocol for a total of 5 minutes.  Reduced exercise tolerance.  Adequate blood pressure response.  Clinically negative for chest pain. Test was stopped due to fatigue.  EKG with borderline ST depression. No significant arrhythmia noted.   Recommendations: Stress Myoview.   CV risk factor modification  Further disposition to follow.  Patient is agreeable to this plan and will call if any problems develop in the interim.   Rosalio Macadamia, RN, ANP-C [facility] [address] [place], [place]  [address] [phone]

## 2013-11-27 NOTE — Patient Instructions (Signed)
Stroke Prevention Some medical conditions and behaviors are associated with an increased chance of having a stroke. You may prevent a stroke by making healthy choices and managing medical conditions. HOW CAN I REDUCE MY RISK OF HAVING A STROKE?   Stay physically active. Get at least 30 minutes of activity on most or all days.  Do not smoke. It may also be helpful to avoid exposure to secondhand smoke.  Limit alcohol use. Moderate alcohol use is considered to be:  No more than 2 drinks per day for men.  No more than 1 drink per day for nonpregnant women.  Eat healthy foods. This involves:  Eating 5 or more servings of fruits and vegetables a day.  Making dietary changes that address high blood pressure (hypertension), high cholesterol, diabetes, or obesity.  Manage your cholesterol levels.  Making food choices that are high in fiber and low in saturated fat, trans fat, and cholesterol may control cholesterol levels.  Take any prescribed medicines to control cholesterol as directed by your health care provider.  Manage your diabetes.  Controlling your carbohydrate and sugar intake is recommended to manage diabetes.  Take any prescribed medicines to control diabetes as directed by your health care provider.  Control your hypertension.  Making food choices that are low in salt (sodium), saturated fat, trans fat, and cholesterol is recommended to manage hypertension.  Take any prescribed medicines to control hypertension as directed by your health care provider.  Maintain a healthy weight.  Reducing calorie intake and making food choices that are low in sodium, saturated fat, trans fat, and cholesterol are recommended to manage weight.  Stop drug abuse.  Avoid taking birth control pills.  Talk to your health care provider about the risks of taking birth control pills if you are over 35 years old, smoke, get migraines, or have ever had a blood clot.  Get evaluated for sleep  disorders (sleep apnea).  Talk to your health care provider about getting a sleep evaluation if you snore a lot or have excessive sleepiness.  Take medicines only as directed by your health care provider.  For some people, aspirin or blood thinners (anticoagulants) are helpful in reducing the risk of forming abnormal blood clots that can lead to stroke. If you have the irregular heart rhythm of atrial fibrillation, you should be on a blood thinner unless there is a good reason you cannot take them.  Understand all your medicine instructions.  Make sure that other conditions (such as anemia or atherosclerosis) are addressed. SEEK IMMEDIATE MEDICAL CARE IF:   You have sudden weakness or numbness of the face, arm, or leg, especially on one side of the body.  Your face or eyelid droops to one side.  You have sudden confusion.  You have trouble speaking (aphasia) or understanding.  You have sudden trouble seeing in one or both eyes.  You have sudden trouble walking.  You have dizziness.  You have a loss of balance or coordination.  You have a sudden, severe headache with no known cause.  You have new chest pain or an irregular heartbeat. Any of these symptoms may represent a serious problem that is an emergency. Do not wait to see if the symptoms will go away. Get medical help at once. Call your local emergency services (911 in [place] Do not drive yourself to the hospital. Document Released: 04/01/2004 Document Revised: 07/09/2013 Document Reviewed: 08/25/2012 ExitCare Patient Information 2015 [employer]. This information is not intended to replace advice given   to you by your health care provider. Make sure you discuss any questions you have with your health care provider.  
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
## 2013-11-27 NOTE — Addendum Note (Signed)
Addended by: Sharee Pimple on: 11/27/2013 04:42 PM   Modules accepted: Orders

## 2013-12-04 ENCOUNTER — Ambulatory Visit (INDEPENDENT_AMBULATORY_CARE_PROVIDER_SITE_OTHER): Payer: BC Managed Care – PPO | Admitting: Family

## 2013-12-04 ENCOUNTER — Ambulatory Visit: Payer: BC Managed Care – PPO | Admitting: Family

## 2013-12-04 ENCOUNTER — Encounter: Payer: Self-pay | Admitting: Family Medicine

## 2013-12-04 ENCOUNTER — Encounter: Payer: Self-pay | Admitting: Family

## 2013-12-04 VITALS — BP 127/75 | HR 80 | Temp 97.9°F | Ht 73.0 in | Wt 302.6 lb

## 2013-12-04 DIAGNOSIS — G514 Facial myokymia: Secondary | ICD-10-CM

## 2013-12-04 DIAGNOSIS — G518 Other disorders of facial nerve: Secondary | ICD-10-CM

## 2013-12-04 DIAGNOSIS — M5412 Radiculopathy, cervical region: Secondary | ICD-10-CM

## 2013-12-04 MED ORDER — CYCLOBENZAPRINE HCL 5 MG PO TABS: 5.0000 mg | ORAL_TABLET | Freq: Three times a day (TID) | ORAL | Status: DC | PRN

## 2013-12-04 MED ORDER — MELOXICAM 15 MG PO TABS: 15.0000 mg | ORAL_TABLET | Freq: Every day | ORAL | Status: DC

## 2013-12-04 NOTE — Progress Notes (Signed)
   Subjective:    Patient ID: Eugene Bautista, male    DOB: 05/02/1955, 57 y.o.   MRN: [id number]  HPI Pt presents to the office for left sided face "twitching" that started Sunday night. However, he states this is his third episode with this. Pt states when this has happened in the past he had a stress test and his carotid arteries checked.  Pt states this "twitching" keeps him up at night. Pt denies pain, double vision, neck pain, trauma to neck, or tenderness on scalp.    Review of Systems  Constitutional: Negative.   HENT: Negative.   Respiratory: Negative.   Cardiovascular: Negative.   Gastrointestinal: Negative.   Endocrine: Negative.   Genitourinary: Negative.   Musculoskeletal: Negative.   Neurological: Negative.   Hematological: Negative.   Psychiatric/Behavioral: Negative.   All other systems reviewed and are negative.      Objective:   Physical Exam  Vitals reviewed. Constitutional: He is oriented to person, place, and time. He appears well-developed and well-nourished. No distress.  HENT:  Head: Normocephalic.  Right Ear: External ear normal.  Left Ear: External ear normal.  Nose: Nose normal.  Mouth/Throat: Oropharynx is clear and moist.  Eyes: Pupils are equal, round, and reactive to light. Right eye exhibits no discharge. Left eye exhibits no discharge.  Neck: Normal range of motion. Neck supple. No thyromegaly present.  Cardiovascular: Normal rate, regular rhythm, normal heart sounds and intact distal pulses.   No murmur heard. Pulmonary/Chest: Effort normal and breath sounds normal. No respiratory distress. He has no wheezes.  Abdominal: Soft. Bowel sounds are normal. He exhibits no distension. There is no tenderness.  Musculoskeletal: Normal range of motion. He exhibits no edema and no tenderness.  Neurological: He is alert and oriented to person, place, and time. He has normal reflexes. No cranial nerve deficit.  Skin: Skin is warm and dry. No rash  noted. No erythema.  Psychiatric: He has a normal mood and affect. His behavior is normal. Judgment and thought content normal.    BP 127/75  Pulse 80  Temp(Src) 97.9 F (36.6 C) (Oral)  Ht 6' 1" (1.854 m)  Wt 302 lb 9.6 oz (137.258 kg)  BMI 39.93 kg/m2       Assessment & Plan:  1. Facial twitching - meloxicam (MOBIC) 15 MG tablet; Take 1 tablet (15 mg total) by mouth daily.  Dispense: 30 tablet; Refill: 0 - CMP14+EGFR - Sedimentation rate - cyclobenzaprine (FLEXERIL) 5 MG tablet; Take 1 tablet (5 mg total) by mouth 3 (three) times daily as needed for muscle spasms.  Dispense: 30 tablet; Refill: 1  2. Cervical radiculitis - meloxicam (MOBIC) 15 MG tablet; Take 1 tablet (15 mg total) by mouth daily.  Dispense: 30 tablet; Refill: 0 - CMP14+EGFR - Sedimentation rate  Stress management No other NSAID's Sedation precaution discussed RTO 1 week  Christy Hawks, FNP   

## 2013-12-04 NOTE — Patient Instructions (Signed)

## 2013-12-05 ENCOUNTER — Telehealth: Payer: Self-pay | Admitting: Family Medicine

## 2013-12-05 LAB — CMP14+EGFR
A/G RATIO: 2 (ref 1.1–2.5)
ALK PHOS: 73 IU/L (ref 39–117)
ALT: 34 IU/L (ref 0–44)
AST: 24 IU/L (ref 0–40)
Albumin: 4.4 g/dL (ref 3.5–5.5)
BUN / CREAT RATIO: 14 (ref 9–20)
BUN: 19 mg/dL (ref 6–24)
CO2: 23 mmol/L (ref 18–29)
CREATININE: 1.36 mg/dL — AB (ref 0.76–1.27)
Calcium: 9.3 mg/dL (ref 8.7–10.2)
Chloride: 93 mmol/L — ABNORMAL LOW (ref 97–108)
GFR calc Af Amer: 67 mL/min/{1.73_m2} (ref >59)
GFR calc non Af Amer: 58 mL/min/{1.73_m2} — ABNORMAL LOW (ref >59)
Globulin, Total: 2.2 g/dL (ref 1.5–4.5)
Glucose: 257 mg/dL — ABNORMAL HIGH (ref 65–99)
POTASSIUM: 4.5 mmol/L (ref 3.5–5.2)
SODIUM: 139 mmol/L (ref 134–144)
Total Bilirubin: 0.7 mg/dL (ref 0.0–1.2)
Total Protein: 6.6 g/dL (ref 6.0–8.5)

## 2013-12-05 LAB — SEDIMENTATION RATE: Sed Rate: 3 mm/hr (ref 0–30)

## 2013-12-05 NOTE — Telephone Encounter (Signed)
Message copied by Azalee CourseFULP, ASHLEY on Wed Dec 05, 2013  2:46 PM ------      Message from: [place], [place] A      Created: Wed Dec 05, 2013  1:14 PM       Blood glucose elevate- Pt needs to be on low carb diet and keep chronic follow-up      Creatinine function slightly elevated- Pt needs to have good control of blood pressure and will continue to monitor      Sed Rate WNL ------

## 2013-12-05 NOTE — Telephone Encounter (Signed)
Patient aware.

## 2013-12-07 ENCOUNTER — Other Ambulatory Visit: Payer: Self-pay | Admitting: Family Medicine

## 2013-12-08 ENCOUNTER — Encounter: Payer: Self-pay | Admitting: *Deleted

## 2013-12-10 ENCOUNTER — Encounter (INDEPENDENT_AMBULATORY_CARE_PROVIDER_SITE_OTHER): Payer: Self-pay

## 2013-12-10 ENCOUNTER — Encounter: Payer: Self-pay | Admitting: Family

## 2013-12-10 ENCOUNTER — Ambulatory Visit (INDEPENDENT_AMBULATORY_CARE_PROVIDER_SITE_OTHER): Payer: BC Managed Care – PPO | Admitting: Family

## 2013-12-10 VITALS — BP 145/80 | HR 82 | Temp 97.1°F | Ht 73.0 in | Wt 305.0 lb

## 2013-12-10 DIAGNOSIS — G514 Facial myokymia: Secondary | ICD-10-CM

## 2013-12-10 NOTE — Patient Instructions (Signed)
Myoclonus Myoclonus is a term that refers to brief, involuntary twitching or jerking of a muscle or a group of muscles. It describes a symptom, and generally, is not a diagnosis of a disease. The myoclonic twitches or jerks are usually caused by sudden muscle contractions. They also can result from brief lapses of contraction. Myoclonic twitches or jerks may occur:  Alone or in sequence.  In a pattern or without pattern.  Infrequently or many times each minute. Often times, myoclonus is one of several symptoms in a wide variety of nervous system disorders such as:  Multiple sclerosis.  Parkinson's disease.  Alzheimer's disease.  Creutzfeldt-Jakob disease. Familiar examples of normal myoclonus include:  Hiccups and jerks.  "Sleep starts" that some people have while drifting off to sleep. Severe cases can severely limit a person's ability to:  Eat.  Talk.  Walk. Myoclonic jerks commonly occur in individuals with epilepsy. The most common types of myoclonus include:  Action.  Cortical reflex.  Essential.  Palatal.  Progressive myoclonus epilepsy.  Reticular reflex.  Sleep.  Stimulus-sensitive. TREATMENT  Treatment for myoclonus consists of medicines that may help reduce symptoms. These drugs (many of which are also used to treat epilepsy) include:   Barbiturates.  Clonazepam.  Phenytoin.  Primidone.  Sodium valproate. The complex origins of myoclonus may require the use of multiple drugs. Document Released: 02/12/2002 Document Revised: 05/17/2011 Document Reviewed: 01/25/2013 Exit[employer] Patient Information 2015 [employer]. This information is not intended to replace advice given to you by your health care provider. Make sure you discuss any questions you have with your health care provider.  

## 2013-12-10 NOTE — Progress Notes (Signed)
   Subjective:    Patient ID: Eugene Bautista, male    DOB: 1955-04-05, 58 y.o.   MRN: [id number]  HPI Pt presents to the office for follow-up on facial twitching. Pt was seen in office was given RX for mobic and flexeril. Pt states he has not had any other episodes since being seen.    Review of Systems  Constitutional: Negative.   HENT: Negative.   Respiratory: Negative.   Cardiovascular: Negative.   Gastrointestinal: Negative.   Endocrine: Negative.   Genitourinary: Negative.   Musculoskeletal: Negative.   Neurological: Negative.   Hematological: Negative.   Psychiatric/Behavioral: Negative.   All other systems reviewed and are negative.      Objective:   Physical Exam  Vitals reviewed. Constitutional: He is oriented to person, place, and time. He appears well-developed and well-nourished. No distress.  HENT:  Head: Normocephalic.  Right Ear: External ear normal.  Left Ear: External ear normal.  Nose: Nose normal.  Mouth/Throat: Oropharynx is clear and moist.  Eyes: Pupils are equal, round, and reactive to light. Right eye exhibits no discharge. Left eye exhibits no discharge.  Neck: Normal range of motion. Neck supple. No thyromegaly present.  Cardiovascular: Normal rate, regular rhythm, normal heart sounds and intact distal pulses.   No murmur heard. Pulmonary/Chest: Effort normal and breath sounds normal. No respiratory distress. He has no wheezes.  Abdominal: Soft. Bowel sounds are normal. He exhibits no distension. There is no tenderness.  Musculoskeletal: Normal range of motion. He exhibits no edema and no tenderness.  Neurological: He is alert and oriented to person, place, and time. He has normal reflexes. No cranial nerve deficit.  Skin: Skin is warm and dry. No rash noted. No erythema.  Psychiatric: He has a normal mood and affect. His behavior is normal. Judgment and thought content normal.    BP 145/80  Pulse 82  Temp(Src) 97.1 F (36.2 C) (Oral)   Ht 6\' 1"  (1.854 m)  Wt 305 lb (138.347 kg)  BMI 40.25 kg/m2       Assessment & Plan:  1. Facial twitching -Continue medications as needed -RTO prn -Stress management  Jannifer Rodneyhristy Reham Slabaugh, FNP

## 2013-12-12 ENCOUNTER — Telehealth: Payer: Self-pay | Admitting: Cardiology

## 2013-12-12 NOTE — Telephone Encounter (Signed)
New problem   Pt's wife stated insurance won't pay for pt to have stress test and she want to speak to nurse. Please call wife.

## 2013-12-13 ENCOUNTER — Encounter (HOSPITAL_COMMUNITY): Payer: BC Managed Care – PPO

## 2013-12-13 NOTE — Telephone Encounter (Signed)
Called pt back ,no answer waiting for call back

## 2014-01-01 ENCOUNTER — Encounter: Payer: Self-pay | Admitting: Family Medicine

## 2014-01-01 ENCOUNTER — Ambulatory Visit (INDEPENDENT_AMBULATORY_CARE_PROVIDER_SITE_OTHER): Payer: BC Managed Care – PPO | Admitting: Family Medicine

## 2014-01-01 VITALS — BP 130/76 | HR 82 | Temp 97.0°F | Ht 73.0 in | Wt 303.2 lb

## 2014-01-01 DIAGNOSIS — I10 Essential (primary) hypertension: Secondary | ICD-10-CM

## 2014-01-01 DIAGNOSIS — E785 Hyperlipidemia, unspecified: Secondary | ICD-10-CM

## 2014-01-01 DIAGNOSIS — E119 Type 2 diabetes mellitus without complications: Secondary | ICD-10-CM

## 2014-01-01 LAB — POCT GLYCOSYLATED HEMOGLOBIN (HGB A1C): Hemoglobin A1C: 6.4

## 2014-01-01 MED ORDER — METOPROLOL SUCCINATE ER 25 MG PO TB24: 25.0000 mg | ORAL_TABLET | Freq: Every day | ORAL | Status: DC

## 2014-01-01 MED ORDER — SITAGLIPTIN PHOS-METFORMIN HCL 50-1000 MG PO TABS
1.0000 | ORAL_TABLET | Freq: Two times a day (BID) | ORAL | Status: DC
Start: 2014-01-01 — End: 2014-06-26

## 2014-01-01 MED ORDER — ROSUVASTATIN CALCIUM 10 MG PO TABS: 10.0000 mg | ORAL_TABLET | Freq: Every day | ORAL | Status: DC

## 2014-01-01 NOTE — Progress Notes (Signed)
   Subjective:    Patient ID: Eugene Bautista, male    DOB: [birth year], 58 y.o.   MRN: [id number]  HPI This 58 y.o. male presents for evaluation of diabetes, hyperlipidemia, and HTN.   Review of Systems    No chest pain, SOB, HA, dizziness, vision change, N/V, diarrhea, constipation, dysuria, urinary urgency or frequency, myalgias, arthralgias or rash.  Objective:   Physical Exam  Vital signs noted  Well developed well nourished male.  HEENT - Head atraumatic Normocephalic                Eyes - PERRLA, Conjuctiva - clear Sclera- Clear EOMI                Ears - EAC's Wnl TM's Wnl Gross Hearing WNL                Nose - Nares patent                 Throat - oropharanx wnl Respiratory - Lungs CTA bilateral Cardiac - RRR S1 and S2 without murmur GI - Abdomen soft Nontender and bowel sounds active x 4 Extremities - No edema. Neuro - Grossly intact.  Results for orders placed in visit on 01/01/14  POCT GLYCOSYLATED HEMOGLOBIN (HGB A1C)      Result Value Ref Range   Hemoglobin A1C 6.4        Assessment & Plan:  Diabetes mellitus type 2, controlled, without complications - Plan: OZW90+YBTV, POCT glycosylated hemoglobin (Hb A1C), sitaGLIPtin-metformin (JANUMET) 50-1000 MG per tablet, CBC With differential/Platelet  Hyperlipemia - Plan: CMP14+EGFR, Lipid panel, rosuvastatin (CRESTOR) 10 MG tablet, CBC With differential/Platelet  Essential hypertension, benign - Plan: metoprolol succinate (TOPROL-XL) 25 MG 24 hr tablet, CBC With differential/Platelet, CANCELED: POCT CBC  Lysbeth Penner FNP

## 2014-01-02 LAB — LIPID PANEL
Chol/HDL Ratio: 2.8 ratio units (ref 0.0–5.0)
Cholesterol, Total: 119 mg/dL (ref 100–199)
HDL: 42 mg/dL (ref >39)
LDL Calculated: 50 mg/dL (ref 0–99)
Triglycerides: 135 mg/dL (ref 0–149)
VLDL Cholesterol Cal: 27 mg/dL (ref 5–40)

## 2014-01-02 LAB — CBC WITH DIFFERENTIAL
Basophils Absolute: 0 10*3/uL (ref 0.0–0.2)
Basos: 0 %
Eos: 4 %
Eosinophils Absolute: 0.3 10*3/uL (ref 0.0–0.4)
HCT: 51.5 % — ABNORMAL HIGH (ref 37.5–51.0)
Hemoglobin: 17.8 g/dL — ABNORMAL HIGH (ref 12.6–17.7)
Immature Grans (Abs): 0 10*3/uL (ref 0.0–0.1)
Immature Granulocytes: 0 %
Lymphocytes Absolute: 2 10*3/uL (ref 0.7–3.1)
Lymphs: 24 %
MCH: 31 pg (ref 26.6–33.0)
MCHC: 34.6 g/dL (ref 31.5–35.7)
MCV: 90 fL (ref 79–97)
Monocytes Absolute: 0.9 10*3/uL (ref 0.1–0.9)
Monocytes: 11 %
Neutrophils Absolute: 4.9 10*3/uL (ref 1.4–7.0)
Neutrophils Relative %: 61 %
Platelets: 211 10*3/uL (ref 150–379)
RBC: 5.75 x10E6/uL (ref 4.14–5.80)
RDW: 13.9 % (ref 12.3–15.4)
WBC: 8.1 10*3/uL (ref 3.4–10.8)

## 2014-01-02 LAB — CMP14+EGFR
ALT: 40 IU/L (ref 0–44)
AST: 31 IU/L (ref 0–40)
Albumin/Globulin Ratio: 1.7 (ref 1.1–2.5)
Albumin: 4.5 g/dL (ref 3.5–5.5)
Alkaline Phosphatase: 73 IU/L (ref 39–117)
BUN/Creatinine Ratio: 16 (ref 9–20)
BUN: 17 mg/dL (ref 6–24)
CO2: 21 mmol/L (ref 18–29)
Calcium: 9.4 mg/dL (ref 8.7–10.2)
Chloride: 91 mmol/L — ABNORMAL LOW (ref 97–108)
Creatinine, Ser: 1.06 mg/dL (ref 0.76–1.27)
GFR calc Af Amer: 90 mL/min/{1.73_m2} (ref >59)
GFR calc non Af Amer: 78 mL/min/{1.73_m2} (ref >59)
Globulin, Total: 2.6 g/dL (ref 1.5–4.5)
Glucose: 120 mg/dL — ABNORMAL HIGH (ref 65–99)
Potassium: 4 mmol/L (ref 3.5–5.2)
Sodium: 138 mmol/L (ref 134–144)
Total Bilirubin: 0.7 mg/dL (ref 0.0–1.2)
Total Protein: 7.1 g/dL (ref 6.0–8.5)

## 2014-01-06 ENCOUNTER — Other Ambulatory Visit: Payer: Self-pay | Admitting: Family Medicine

## 2014-01-07 ENCOUNTER — Other Ambulatory Visit: Payer: Self-pay | Admitting: Family Medicine

## 2014-01-22 ENCOUNTER — Other Ambulatory Visit: Payer: Self-pay | Admitting: Family Medicine

## 2014-01-23 NOTE — Telephone Encounter (Signed)
Please review and advise.

## 2014-01-23 NOTE — Telephone Encounter (Signed)
Last seen 01/01/14 B [place]  This med not on EPIC list

## 2014-01-25 MED ORDER — CANAGLIFLOZIN 300 MG PO TABS: 300.0000 mg | ORAL_TABLET | Freq: Every day | ORAL | Status: DC

## 2014-01-25 NOTE — Addendum Note (Signed)
Addended by: Bearl MulberryUTHERFORD, Emeka Lindner K on: 01/25/2014 12:49 PM   Modules accepted: Orders

## 2014-04-08 ENCOUNTER — Telehealth: Payer: Self-pay | Admitting: *Deleted

## 2014-04-08 DIAGNOSIS — G514 Facial myokymia: Secondary | ICD-10-CM

## 2014-04-08 NOTE — Telephone Encounter (Signed)
Pt was seen by Eugene Bautista for facial twitching in October It

## 2014-04-12 ENCOUNTER — Other Ambulatory Visit: Payer: Self-pay | Admitting: Family Medicine

## 2014-05-31 ENCOUNTER — Other Ambulatory Visit: Payer: Self-pay | Admitting: Family Medicine

## 2014-06-14 ENCOUNTER — Other Ambulatory Visit: Payer: Self-pay | Admitting: Family Medicine

## 2014-06-26 ENCOUNTER — Ambulatory Visit (INDEPENDENT_AMBULATORY_CARE_PROVIDER_SITE_OTHER): Payer: BLUE CROSS/BLUE SHIELD

## 2014-06-26 ENCOUNTER — Ambulatory Visit (INDEPENDENT_AMBULATORY_CARE_PROVIDER_SITE_OTHER): Payer: BLUE CROSS/BLUE SHIELD | Admitting: Family Medicine

## 2014-06-26 ENCOUNTER — Encounter: Payer: Self-pay | Admitting: Family Medicine

## 2014-06-26 VITALS — BP 145/85 | HR 76 | Temp 98.2°F | Ht 73.0 in | Wt 290.0 lb

## 2014-06-26 DIAGNOSIS — M25562 Pain in left knee: Secondary | ICD-10-CM

## 2014-06-26 MED ORDER — VALSARTAN-HYDROCHLOROTHIAZIDE 320-25 MG PO TABS: 1.0000 | ORAL_TABLET | Freq: Every day | ORAL | Status: DC

## 2014-06-26 NOTE — Patient Instructions (Addendum)
Basic Carbohydrate Counting for Diabetes Mellitus Carbohydrate counting is a method for keeping track of the amount of carbohydrates you eat. Eating carbohydrates naturally increases the level of sugar (glucose) in your blood, so it is important for you to know the amount that is okay for you to have in every meal. Carbohydrate counting helps keep the level of glucose in your blood within normal limits. The amount of carbohydrates allowed is different for every person. A dietitian can help you calculate the amount that is right for you. Once you know the amount of carbohydrates you can have, you can count the carbohydrates in the foods you want to eat. Carbohydrates are found in the following foods:  Grains, such as breads and cereals.  Dried beans and soy products.  Starchy vegetables, such as potatoes, peas, and corn.  Fruit and fruit juices.  Milk and yogurt.  Sweets and snack foods, such as cake, cookies, candy, chips, soft drinks, and fruit drinks. CARBOHYDRATE COUNTING There are two ways to count the carbohydrates in your food. You can use either of the methods or a combination of both. Reading the "Nutrition Facts" on Packaged Food The "Nutrition Facts" is an area that is included on the labels of almost all packaged food and beverages in the [place]. It includes the serving size of that food or beverage and information about the nutrients in each serving of the food, including the grams (g) of carbohydrate per serving.  Decide the number of servings of this food or beverage that you will be able to eat or drink. Multiply that number of servings by the number of grams of carbohydrate that is listed on the label for that serving. The total will be the amount of carbohydrates you will be having when you eat or drink this food or beverage. Learning Standard Serving Sizes of Food When you eat food that is not packaged or does not include "Nutrition Facts" on the label, you need to  measure the servings in order to count the amount of carbohydrates.A serving of most carbohydrate-rich foods contains about 15 g of carbohydrates. The following list includes serving sizes of carbohydrate-rich foods that provide 15 g ofcarbohydrate per serving:   1 slice of bread (1 oz) or 1 six-inch tortilla.    of a hamburger bun or English muffin.  4-6 crackers.   cup unsweetened dry cereal.    cup hot cereal.   cup rice or pasta.    cup mashed potatoes or  of a large baked potato.  1 cup fresh fruit or one small piece of fruit.    cup canned or frozen fruit or fruit juice.  1 cup milk.   cup plain fat-free yogurt or yogurt sweetened with artificial sweeteners.   cup cooked dried beans or starchy vegetable, such as peas, corn, or potatoes.  Decide the number of standard-size servings that you will eat. Multiply that number of servings by 15 (the grams of carbohydrates in that serving). For example, if you eat 2 cups of strawberries, you will have eaten 2 servings and 30 g of carbohydrates (2 servings x 15 g = 30 g). For foods such as soups and casseroles, in which more than one food is mixed in, you will need to count the carbohydrates in each food that is included. EXAMPLE OF CARBOHYDRATE COUNTING Sample Dinner  3 oz chicken breast.   cup of brown rice.   cup of corn.  1 cup milk.   1 cup strawberries with   sugar-free whipped topping.  Carbohydrate Calculation Step 1: Identify the foods that contain carbohydrates:   Rice.   Corn.   Milk.   Strawberries. Step 2:Calculate the number of servings eaten of each:   2 servings of rice.   1 serving of corn.   1 serving of milk.   1 serving of strawberries. Step 3: Multiply each of those number of servings by 15 g:   2 servings of rice x 15 g = 30 g.   1 serving of corn x 15 g = 15 g.   1 serving of milk x 15 g = 15 g.   1 serving of strawberries x 15 g = 15 g. Step 4: Add  together all of the amounts to find the total grams of carbohydrates eaten: 30 g + 15 g + 15 g + 15 g = 75 g. Document Released: 02/22/2005 Document Revised: 07/09/2013 Document Reviewed: 01/19/2013 Exit[employer] Patient Information 2015 [employer]. This information is not intended to replace advice given to you by your health care provider. Make sure you discuss any questions you have with your health care provider. DASH Eating Plan DASH stands for "Dietary Approaches to Stop Hypertension." The DASH eating plan is a healthy eating plan that has been shown to reduce high blood pressure (hypertension). Additional health benefits may include reducing the risk of type 2 diabetes mellitus, heart disease, and stroke. The DASH eating plan may also help with weight loss. WHAT DO I NEED TO KNOW ABOUT THE DASH EATING PLAN? For the DASH eating plan, you will follow these general guidelines:  Choose foods with a percent daily value for sodium of less than 5% (as listed on the food label).  Use salt-free seasonings or herbs instead of table salt or sea salt.  Check with your health care provider or pharmacist before using salt substitutes.  Eat lower-sodium products, often labeled as "lower sodium" or "no salt added."  Eat fresh foods.  Eat more vegetables, fruits, and low-fat dairy products.  Choose whole grains. Look for the word "whole" as the first word in the ingredient list.  Choose fish and skinless chicken or turkey more often than red meat. Limit fish, poultry, and meat to 6 oz (170 g) each day.  Limit sweets, desserts, sugars, and sugary drinks.  Choose heart-healthy fats.  Limit cheese to 1 oz (28 g) per day.  Eat more home-cooked food and less restaurant, buffet, and fast food.  Limit fried foods.  Cook foods using methods other than frying.  Limit canned vegetables. If you do use them, rinse them well to decrease the sodium.  When eating at a restaurant, ask that your food be  prepared with less salt, or no salt if possible. WHAT FOODS CAN I EAT? Seek help from a dietitian for individual calorie needs. Grains Whole grain or whole wheat bread. Brown rice. Whole grain or whole wheat pasta. Quinoa, bulgur, and whole grain cereals. Low-sodium cereals. Corn or whole wheat flour tortillas. Whole grain cornbread. Whole grain crackers. Low-sodium crackers. Vegetables Fresh or frozen vegetables (raw, steamed, roasted, or grilled). Low-sodium or reduced-sodium tomato and vegetable juices. Low-sodium or reduced-sodium tomato sauce and paste. Low-sodium or reduced-sodium canned vegetables.  Fruits All fresh, canned (in natural juice), or frozen fruits. Meat and Other Protein Products Ground beef (85% or leaner), grass-fed beef, or beef trimmed of fat. Skinless chicken or turkey. Ground chicken or turkey. Pork trimmed of fat. All fish and seafood. Eggs. Dried beans, peas, or lentils.   Unsalted nuts and seeds. Unsalted canned beans. Dairy Low-fat dairy products, such as skim or 1% milk, 2% or reduced-fat cheeses, low-fat ricotta or cottage cheese, or plain low-fat yogurt. Low-sodium or reduced-sodium cheeses. Fats and Oils Tub margarines without trans fats. Light or reduced-fat mayonnaise and salad dressings (reduced sodium). Avocado. Safflower, olive, or canola oils. Natural peanut or almond butter. Other Unsalted popcorn and pretzels. The items listed above may not be a complete list of recommended foods or beverages. Contact your dietitian for more options. WHAT FOODS ARE NOT RECOMMENDED? Grains White bread. White pasta. White rice. Refined cornbread. Bagels and croissants. Crackers that contain trans fat. Vegetables Creamed or fried vegetables. Vegetables in a cheese sauce. Regular canned vegetables. Regular canned tomato sauce and paste. Regular tomato and vegetable juices. Fruits Dried fruits. Canned fruit in light or heavy syrup. Fruit juice. Meat and Other Protein  Products Fatty cuts of meat. Ribs, chicken wings, bacon, sausage, bologna, salami, chitterlings, fatback, hot dogs, bratwurst, and packaged luncheon meats. Salted nuts and seeds. Canned beans with salt. Dairy Whole or 2% milk, cream, half-and-half, and cream cheese. Whole-fat or sweetened yogurt. Full-fat cheeses or blue cheese. Nondairy creamers and whipped toppings. Processed cheese, cheese spreads, or cheese curds. Condiments Onion and garlic salt, seasoned salt, table salt, and sea salt. Canned and packaged gravies. Worcestershire sauce. Tartar sauce. Barbecue sauce. Teriyaki sauce. Soy sauce, including reduced sodium. Steak sauce. Fish sauce. Oyster sauce. Cocktail sauce. Horseradish. Ketchup and mustard. Meat flavorings and tenderizers. Bouillon cubes. Hot sauce. Tabasco sauce. Marinades. Taco seasonings. Relishes. Fats and Oils Butter, stick margarine, lard, shortening, ghee, and bacon fat. Coconut, palm kernel, or palm oils. Regular salad dressings. Other Pickles and olives. Salted popcorn and pretzels. The items listed above may not be a complete list of foods and beverages to avoid. Contact your dietitian for more information. WHERE CAN I FIND MORE INFORMATION? National Heart, Lung, and Blood Institute: CableProm[employer].itwww.nhlbi.nih.gov/health/health-topics/topics/dash/ Document Released: 02/11/2011 Document Revised: 07/09/2013 Document Reviewed: 12/27/2012 [facility] Patient Information 2015 [place], [place]. This information is not intended to replace advice given to you by your health care provider. Make sure you discuss any questions you have with your health care provider.    Use glucosamine and chondroitin for relief of arthritis joint pain particularly in the knees. One common brand that is well known is Osteo Bi-Flex.

## 2014-06-26 NOTE — Progress Notes (Signed)
Subjective:  Patient ID: Eugene Bautista, male    DOB: 05-25-1955  Age: 60 y.o. MRN: [id number]  CC: Medication Refill and Knee Pain   HPI Eugene Bautista presents for increase in right knee pain. Pain is chronic but had acute exacerbation last night. Not sure why.NKI. Pain level was 10/10 last night 6/10 this morning interfered with sleep. Patient concerned about the possibility of gout   follow-up of hypertension. Patient has no history of headache chest pain or shortness of breath or recent cough. Patient also denies symptoms of TIA such as numbness weakness lateralizing. Patient checks  blood pressure at home and has not had any elevated readings recently. Patient denies side effects from his medication. States taking it regularly. Follow-up of diabetes. Patient does not check blood sugar at home Patient denies symptoms such as polyuria, polydipsia, excessive hunger, nausea No significant hypoglycemic spells noted. Medications as noted below. Taking them regularly without complication/adverse reaction being reported today.  History Eugene Bautista has a past medical history of Hyperlipidemia; Hypertension; Left carotid bruit; Diabetes mellitus without complication; and Obesity.   Eugene Bautista has past surgical history that includes Ruptured Aorta Repair (1989); Urinary surgery (1989); and Fracture surgery (Left, 1989).   His family history includes Cancer in his brother; Dementia in his mother; Diabetes in his father; Heart attack (age of onset: 78) in his father; Heart disease in his father; Kidney disease in his father.Eugene Bautista reports that Eugene Bautista quit smoking about 13 years ago. His smoking use included Cigarettes. Eugene Bautista has never used smokeless tobacco. Eugene Bautista reports that Eugene Bautista drinks about 3.6 oz of alcohol per week. Eugene Bautista reports that Eugene Bautista does not use illicit drugs.  Current Outpatient Prescriptions on File Prior to Visit  Medication Sig Dispense Refill  . amLODipine (NORVASC) 10 MG tablet TAKE 1 TABLET (10 MG  TOTAL) BY MOUTH DAILY. 90 tablet 3  . aspirin 81 MG tablet Take 81 mg by mouth daily.    . canagliflozin (INVOKANA) 300 MG TABS tablet Take 300 mg by mouth daily before breakfast. (Patient taking differently: Take 300 mg by mouth daily before breakfast. 1/2 tablet daily) 30 tablet 2  . cholecalciferol (VITAMIN D) 1000 UNITS tablet Take 2,000 Units by mouth daily.    . cyclobenzaprine (FLEXERIL) 5 MG tablet Take 1 tablet (5 mg total) by mouth 3 (three) times daily as needed for muscle spasms. 30 tablet 1  . fish oil-omega-3 fatty acids 1000 MG capsule Take 1 g by mouth 2 (two) times daily.    Marland Kitchen glipiZIDE (GLUCOTROL) 5 MG tablet TAKE 1 TABLET (5 MG TOTAL) BY MOUTH EVERY MORNING. 30 tablet 3  . JANUMET 50-1000 MG per tablet TAKE 1 TABLET BY MOUTH TWICE A DAY 180 tablet 4  . metoprolol succinate (TOPROL-XL) 25 MG 24 hr tablet Take 1 tablet (25 mg total) by mouth daily. 30 tablet 11  . rosuvastatin (CRESTOR) 10 MG tablet Take 1 tablet (10 mg total) by mouth at bedtime. 30 tablet 11   No current facility-administered medications on file prior to visit.    ROS Review of Systems  Constitutional: Negative for fever, chills, diaphoresis and unexpected weight change.  HENT: Negative for congestion, hearing loss, rhinorrhea, sore throat and trouble swallowing.   Respiratory: Negative for cough, chest tightness, shortness of breath and wheezing.   Gastrointestinal: Negative for nausea, vomiting, abdominal pain, diarrhea, constipation and abdominal distention.  Endocrine: Negative for cold intolerance and heat intolerance.  Genitourinary: Negative for dysuria, hematuria and flank pain.  Musculoskeletal: Negative  for joint swelling and arthralgias.  Skin: Negative for rash.  Neurological: Negative for dizziness and headaches.  Psychiatric/Behavioral: Negative for dysphoric mood, decreased concentration and agitation. The patient is not nervous/anxious.     Objective:  BP 145/85 mmHg  Pulse 76   Temp(Src) 98.2 F (36.8 C) (Oral)  Ht 6\' 1"  (1.854 m)  Wt 290 lb (131.543 kg)  BMI 38.27 kg/m2  BP Readings from Last 3 Encounters:  06/26/14 145/85  01/01/14 130/76  12/10/13 145/80    Wt Readings from Last 3 Encounters:  06/26/14 290 lb (131.543 kg)  01/01/14 303 lb 3.2 oz (137.531 kg)  12/10/13 305 lb (138.347 kg)     Physical Exam  Constitutional: Eugene Bautista is oriented to person, place, and time. Eugene Bautista appears well-developed and well-nourished. No distress.  HENT:  Head: Normocephalic and atraumatic.  Right Ear: External ear normal.  Left Ear: External ear normal.  Nose: Nose normal.  Mouth/Throat: Oropharynx is clear and moist.  Eyes: Conjunctivae and EOM are normal. Pupils are equal, round, and reactive to light.  Neck: Normal range of motion. Neck supple. No thyromegaly present.  Cardiovascular: Normal rate, regular rhythm and normal heart sounds.   No murmur heard. Pulmonary/Chest: Effort normal and breath sounds normal. No respiratory distress. Eugene Bautista has no wheezes. Eugene Bautista has no rales.  Abdominal: Soft. Bowel sounds are normal. Eugene Bautista exhibits no distension. There is no tenderness.  Lymphadenopathy:    Eugene Bautista has no cervical adenopathy.  Neurological: Eugene Bautista is alert and oriented to person, place, and time. Eugene Bautista has normal reflexes.  Skin: Skin is warm and dry.  Psychiatric: Eugene Bautista has a normal mood and affect. His behavior is normal. Judgment and thought content normal.    Lab Results  Component Value Date   HGBA1C 6.4 01/01/2014   HGBA1C 6.7 08/06/2013   HGBA1C 6.4 05/25/2013    Lab Results  Component Value Date   WBC 8.1 01/01/2014   HGB 17.8* 01/01/2014   HCT 51.5* 01/01/2014   PLT 211 01/01/2014   GLUCOSE 120* 01/01/2014   CHOL 119 01/01/2014   TRIG 135 01/01/2014   HDL 42 01/01/2014   LDLCALC 50 01/01/2014   ALT 40 01/01/2014   AST 31 01/01/2014   NA 138 01/01/2014   K 4.0 01/01/2014   CL 91* 01/01/2014   CREATININE 1.06 01/01/2014   BUN 17 01/01/2014   CO2 21 01/01/2014    TSH 1.430 08/06/2013   PSA 0.5 08/06/2013   HGBA1C 6.4 01/01/2014    No results found.  Assessment & Plan:   Eugene Bautista was seen today for medication refill and knee pain.  Diagnoses and all orders for this visit:  Left knee pain Orders: -     DG Knee 1-2 Views Left; Future -     Uric acid; Future -     Testosterone,Free and Total; Future  Other orders -     valsartan-hydrochlorothiazide (DIOVAN-HCT) 320-25 MG per tablet; Take 1 tablet by mouth daily.   I have discontinued Eugene Bautista's meloxicam, sitaGLIPtin-metformin, INVOKANA, and losartan-hydrochlorothiazide. I am also having him start on valsartan-hydrochlorothiazide. Additionally, I am having him maintain his aspirin, fish oil-omega-3 fatty acids, cholecalciferol, glipiZIDE, cyclobenzaprine, metoprolol succinate, rosuvastatin, canagliflozin, JANUMET, and amLODipine.  Meds ordered this encounter  Medications  . valsartan-hydrochlorothiazide (DIOVAN-HCT) 320-25 MG per tablet    Sig: Take 1 tablet by mouth daily.    Dispense:  30 tablet    Refill:  5     Follow-up: No Follow-up on file.  Mechele ClaudeWarren Tavionna Grout,  M.D.  

## 2014-06-29 ENCOUNTER — Other Ambulatory Visit: Payer: BLUE CROSS/BLUE SHIELD

## 2014-06-29 DIAGNOSIS — M25562 Pain in left knee: Secondary | ICD-10-CM

## 2014-06-29 LAB — HM DIABETES EYE EXAM

## 2014-06-30 LAB — TESTOSTERONE,FREE AND TOTAL
TESTOSTERONE: 204 ng/dL — AB (ref 348–1197)
Testosterone, Free: 10 pg/mL (ref 7.2–24.0)

## 2014-06-30 LAB — URIC ACID: URIC ACID: 5.1 mg/dL (ref 3.7–8.6)

## 2014-07-01 ENCOUNTER — Telehealth: Payer: Self-pay | Admitting: Family Medicine

## 2014-07-01 ENCOUNTER — Telehealth: Payer: Self-pay | Admitting: Family

## 2014-07-01 NOTE — Progress Notes (Signed)
lmtcb

## 2014-07-01 NOTE — Telephone Encounter (Signed)
Stp and he couldn't remember if Dr.Stacks changed his BP medication or if he added more to the regimen. Advised pt according to the notes he should be on metoprolol, losartan/hctz, and amodipine. Pt voiced understanding.

## 2014-07-04 ENCOUNTER — Encounter: Payer: Self-pay | Admitting: *Deleted

## 2014-07-11 ENCOUNTER — Telehealth: Payer: Self-pay | Admitting: Family Medicine

## 2014-07-11 DIAGNOSIS — E785 Hyperlipidemia, unspecified: Secondary | ICD-10-CM

## 2014-07-11 NOTE — Telephone Encounter (Signed)
Pt is changing over to [facility] and needs prescriptions sent in. All have been set up for 90 day supplies with refills.

## 2014-07-11 NOTE — Telephone Encounter (Signed)
If necessary [facility] to [facility]. If he needs a temporary supply to tide him over until his mail order gets here please send in a limited supply to a local pharmacy.

## 2014-07-12 MED ORDER — AMLODIPINE BESYLATE 10 MG PO TABS: 10.0000 mg | ORAL_TABLET | Freq: Every day | ORAL | Status: DC

## 2014-07-12 MED ORDER — CANAGLIFLOZIN 300 MG PO TABS: 300.0000 mg | ORAL_TABLET | Freq: Every day | ORAL | Status: DC

## 2014-07-12 MED ORDER — SITAGLIPTIN PHOS-METFORMIN HCL 50-1000 MG PO TABS: 1.0000 | ORAL_TABLET | Freq: Two times a day (BID) | ORAL | Status: DC

## 2014-07-12 MED ORDER — GLIPIZIDE 5 MG PO TABS: ORAL_TABLET | ORAL | Status: DC

## 2014-07-12 MED ORDER — ROSUVASTATIN CALCIUM 10 MG PO TABS: 10.0000 mg | ORAL_TABLET | Freq: Every day | ORAL | Status: DC

## 2014-07-12 NOTE — Telephone Encounter (Signed)
Medications sent to [facility] 

## 2014-07-17 NOTE — Telephone Encounter (Signed)
This message was handled in another encounter.

## 2014-07-19 ENCOUNTER — Telehealth: Payer: Self-pay | Admitting: Family Medicine

## 2014-07-19 DIAGNOSIS — I10 Essential (primary) hypertension: Secondary | ICD-10-CM

## 2014-07-22 MED ORDER — METOPROLOL SUCCINATE ER 25 MG PO TB24: 25.0000 mg | ORAL_TABLET | Freq: Every day | ORAL | Status: DC

## 2014-07-22 MED ORDER — VALSARTAN-HYDROCHLOROTHIAZIDE 320-25 MG PO TABS: 1.0000 | ORAL_TABLET | Freq: Every day | ORAL | Status: DC

## 2014-07-22 NOTE — Telephone Encounter (Signed)
rx sent for valsartan can you please check on toprol prior authorization

## 2014-10-15 ENCOUNTER — Ambulatory Visit (INDEPENDENT_AMBULATORY_CARE_PROVIDER_SITE_OTHER): Payer: BLUE CROSS/BLUE SHIELD | Admitting: Family Medicine

## 2014-10-15 ENCOUNTER — Encounter: Payer: Self-pay | Admitting: Family Medicine

## 2014-10-15 VITALS — BP 137/79 | HR 69 | Temp 97.6°F | Ht 73.0 in | Wt 291.0 lb

## 2014-10-15 DIAGNOSIS — E785 Hyperlipidemia, unspecified: Secondary | ICD-10-CM | POA: Diagnosis not present

## 2014-10-15 DIAGNOSIS — N182 Chronic kidney disease, stage 2 (mild): Secondary | ICD-10-CM

## 2014-10-15 DIAGNOSIS — E1169 Type 2 diabetes mellitus with other specified complication: Secondary | ICD-10-CM

## 2014-10-15 DIAGNOSIS — E291 Testicular hypofunction: Secondary | ICD-10-CM

## 2014-10-15 DIAGNOSIS — Z139 Encounter for screening, unspecified: Secondary | ICD-10-CM

## 2014-10-15 DIAGNOSIS — I10 Essential (primary) hypertension: Secondary | ICD-10-CM | POA: Diagnosis not present

## 2014-10-15 DIAGNOSIS — E1121 Type 2 diabetes mellitus with diabetic nephropathy: Secondary | ICD-10-CM | POA: Diagnosis not present

## 2014-10-15 DIAGNOSIS — N189 Chronic kidney disease, unspecified: Secondary | ICD-10-CM | POA: Insufficient documentation

## 2014-10-15 DIAGNOSIS — N2889 Other specified disorders of kidney and ureter: Secondary | ICD-10-CM

## 2014-10-15 LAB — POCT GLYCOSYLATED HEMOGLOBIN (HGB A1C): Hemoglobin A1C: 6.5

## 2014-10-15 MED ORDER — TESTOSTERONE 20.25 MG/ACT (1.62%) TD GEL: 2.0000 "application " | Freq: Every day | TRANSDERMAL | Status: DC

## 2014-10-15 NOTE — Progress Notes (Signed)
Subjective:  Patient ID: Eugene Bautista, male    DOB: [birth year]  Age: 59 y.o. MRN: [id number]  CC: Diabetes; Hypertension; and Hyperlipidemia   HPI Eugene Bautista presents for  follow-up of hypertension. Patient has no history of headache chest pain or shortness of breath or recent cough. Patient also denies symptoms of TIA such as numbness weakness lateralizing. Patient checks  blood pressure at home and has not had any elevated readings recently. Patient denies side effects from his medication. States taking it regularly.  Patient also  in for follow-up of elevated cholesterol. Doing well without complaints on current medication. Denies side effects of statin including myalgia and arthralgia and nausea. Also in today for liver function testing. Currently no chest pain, shortness of breath or other cardiovascular related symptoms noted.  Follow-up of diabetes. Brings in lab from work showing recent A1c 7.4 and creatinine at 1.7.  Patient does check blood sugar at home. Readings run between 150 and 160 fasting usually. Patient denies symptoms such as polyuria, polydipsia, excessive hunger, nausea No significant hypoglycemic spells noted. Medications as noted below. Taking them regularly without complication/adverse reaction being reported today.    History Eugene Bautista has a past medical history of Hyperlipidemia; Hypertension; Left carotid bruit; Diabetes mellitus without complication; and Obesity.   He has past surgical history that includes Ruptured Aorta Repair (1989); Urinary surgery (1989); and Fracture surgery (Left, 1989).   His family history includes Cancer in his brother; Dementia in his mother; Diabetes in his father; Heart attack (age of onset: 92) in his father; Heart disease in his father; Kidney disease in his father.He reports that he quit smoking about 13 years ago. His smoking use included Cigarettes. He has never used smokeless tobacco. He reports that he drinks  about 3.6 oz of alcohol per week. He reports that he does not use illicit drugs.  Current Outpatient Prescriptions on File Prior to Visit  Medication Sig Dispense Refill  . amLODipine (NORVASC) 10 MG tablet Take 1 tablet (10 mg total) by mouth daily. 90 tablet 1  . aspirin 81 MG tablet Take 81 mg by mouth daily.    . canagliflozin (INVOKANA) 300 MG TABS tablet Take 300 mg by mouth daily before breakfast. 90 tablet 1  . cholecalciferol (VITAMIN D) 1000 UNITS tablet Take 2,000 Units by mouth daily.    . fish oil-omega-3 fatty acids 1000 MG capsule Take 1 g by mouth 2 (two) times daily.    Marland Kitchen glipiZIDE (GLUCOTROL) 5 MG tablet TAKE 1 TABLET (5 MG TOTAL) BY MOUTH EVERY MORNING. 90 tablet 1  . metoprolol succinate (TOPROL-XL) 25 MG 24 hr tablet Take 1 tablet (25 mg total) by mouth daily. 90 tablet 3  . rosuvastatin (CRESTOR) 10 MG tablet Take 1 tablet (10 mg total) by mouth at bedtime. 90 tablet 1  . sitaGLIPtin-metformin (JANUMET) 50-1000 MG per tablet Take 1 tablet by mouth 2 (two) times daily. 180 tablet 1  . valsartan-hydrochlorothiazide (DIOVAN-HCT) 320-25 MG per tablet Take 1 tablet by mouth daily. 90 tablet 1   No current facility-administered medications on file prior to visit.    ROS Review of Systems  Constitutional: Negative for fever, chills and diaphoresis.  HENT: Negative for congestion, rhinorrhea and sore throat.   Respiratory: Negative for cough, shortness of breath and wheezing.   Cardiovascular: Negative for chest pain.  Gastrointestinal: Negative for nausea, vomiting, abdominal pain, diarrhea, constipation and abdominal distention.  Genitourinary: Negative for dysuria and frequency.  Musculoskeletal: Negative for joint  swelling and arthralgias.  Skin: Negative for rash.  Neurological: Negative for headaches.    Objective:  BP 137/79 mmHg  Pulse 69  Temp(Src) 97.6 F (36.4 C) (Oral)  Ht 6' 1"  (1.854 m)  Wt 291 lb (131.997 kg)  BMI 38.40 kg/m2  BP Readings from Last  3 Encounters:  10/15/14 137/79  06/26/14 145/85  01/01/14 130/76    Wt Readings from Last 3 Encounters:  10/15/14 291 lb (131.997 kg)  06/26/14 290 lb (131.543 kg)  01/01/14 303 lb 3.2 oz (137.531 kg)     Physical Exam  Constitutional: He is oriented to person, place, and time. He appears well-developed and well-nourished. No distress.  HENT:  Head: Normocephalic and atraumatic.  Right Ear: External ear normal.  Left Ear: External ear normal.  Nose: Nose normal.  Mouth/Throat: Oropharynx is clear and moist.  Eyes: Conjunctivae and EOM are normal. Pupils are equal, round, and reactive to light.  Neck: Normal range of motion. Neck supple. No thyromegaly present.  Cardiovascular: Normal rate, regular rhythm and normal heart sounds.   No murmur heard. Pulmonary/Chest: Effort normal and breath sounds normal. No respiratory distress. He has no wheezes. He has no rales.  Abdominal: Soft. Bowel sounds are normal. He exhibits no distension. There is no tenderness.  Lymphadenopathy:    He has no cervical adenopathy.  Neurological: He is alert and oriented to person, place, and time. He has normal reflexes.  Skin: Skin is warm and dry.  Psychiatric: He has a normal mood and affect. His behavior is normal. Judgment and thought content normal.    Lab Results  Component Value Date   HGBA1C 6.5 10/15/2014   HGBA1C 6.4 01/01/2014   HGBA1C 6.7 08/06/2013    Lab Results  Component Value Date   WBC 8.1 01/01/2014   HGB 17.8* 01/01/2014   HCT 51.5* 01/01/2014   PLT 211 01/01/2014   GLUCOSE 120* 01/01/2014   CHOL 119 01/01/2014   TRIG 135 01/01/2014   HDL 42 01/01/2014   LDLCALC 50 01/01/2014   ALT 40 01/01/2014   AST 31 01/01/2014   NA 138 01/01/2014   K 4.0 01/01/2014   CL 91* 01/01/2014   CREATININE 1.06 01/01/2014   BUN 17 01/01/2014   CO2 21 01/01/2014   TSH 1.430 08/06/2013   PSA 0.5 08/06/2013   HGBA1C 6.5 10/15/2014    No results found.  Assessment & Plan:    Jafari was seen today for diabetes, hypertension and hyperlipidemia.  Diagnoses and all orders for this visit:  Dyslipidemia associated with type 2 diabetes mellitus Orders: -     CMP14+EGFR; Standing -     Lipid panel; Standing -     CMP14+EGFR -     Lipid panel -     CBC with Differential/Platelet  Type 2 diabetes mellitus with diabetic nephropathy Orders: -     POCT glycosylated hemoglobin (Hb A1C); Standing -     CMP14+EGFR; Standing -     POCT UA - Microalbumin -     POCT glycosylated hemoglobin (Hb A1C) -     CMP14+EGFR -     CBC with Differential/Platelet  Essential hypertension Orders: -     Cancel: POCT CBC; Standing -     CMP14+EGFR; Standing -     POCT UA - Microalbumin -     POCT CBC -     CMP14+EGFR -     CBC with Differential/Platelet  Screening Orders: -     PSA, total and  free -     Vit D  25 hydroxy (rtn osteoporosis monitoring) -     CBC with Differential/Platelet  Chronic renal insufficiency, stage 2 (mild) Orders: -     POCT UA - Microalbumin  Hypogonadism, testicular Orders: -     Testosterone (ANDROGEL PUMP) 20.25 MG/ACT (1.62%) GEL; Place 2 application onto the skin daily.   I have discontinued Eugene Bautista cyclobenzaprine. I am also having him start on Testosterone. Additionally, I am having him maintain his aspirin, fish oil-omega-3 fatty acids, cholecalciferol, amLODipine, sitaGLIPtin-metformin, canagliflozin, rosuvastatin, glipiZIDE, metoprolol succinate, and valsartan-hydrochlorothiazide.  Meds ordered this encounter  Medications  . Testosterone (ANDROGEL PUMP) 20.25 MG/ACT (1.62%) GEL    Sig: Place 2 application onto the skin daily.    Dispense:  225 g    Refill:  1     Follow-up: Return in about 3 months (around 01/15/2015) for diabetes & Testosterone.  Claretta Fraise, M.D.

## 2014-10-15 NOTE — Patient Instructions (Signed)

## 2014-10-16 LAB — CBC WITH DIFFERENTIAL/PLATELET
BASOS: 0 %
Basophils Absolute: 0 10*3/uL (ref 0.0–0.2)
EOS (ABSOLUTE): 0.2 10*3/uL (ref 0.0–0.4)
Eos: 3 %
HEMATOCRIT: 47.6 % (ref 37.5–51.0)
Hemoglobin: 16.7 g/dL (ref 12.6–17.7)
IMMATURE GRANS (ABS): 0 10*3/uL (ref 0.0–0.1)
Immature Granulocytes: 0 %
LYMPHS ABS: 1.6 10*3/uL (ref 0.7–3.1)
Lymphs: 18 %
MCH: 30.9 pg (ref 26.6–33.0)
MCHC: 35.1 g/dL (ref 31.5–35.7)
MCV: 88 fL (ref 79–97)
Monocytes Absolute: 1 10*3/uL — ABNORMAL HIGH (ref 0.1–0.9)
Monocytes: 12 %
Neutrophils Absolute: 5.8 10*3/uL (ref 1.4–7.0)
Neutrophils: 67 %
Platelets: 199 10*3/uL (ref 150–379)
RBC: 5.4 x10E6/uL (ref 4.14–5.80)
RDW: 14.3 % (ref 12.3–15.4)
WBC: 8.7 10*3/uL (ref 3.4–10.8)

## 2014-10-16 LAB — CMP14+EGFR
A/G RATIO: 1.7 (ref 1.1–2.5)
ALBUMIN: 4.5 g/dL (ref 3.5–5.5)
ALK PHOS: 74 IU/L (ref 39–117)
ALT: 25 IU/L (ref 0–44)
AST: 17 IU/L (ref 0–40)
BILIRUBIN TOTAL: 0.8 mg/dL (ref 0.0–1.2)
BUN/Creatinine Ratio: 22 — ABNORMAL HIGH (ref 9–20)
BUN: 27 mg/dL — AB (ref 6–24)
CHLORIDE: 95 mmol/L — AB (ref 97–108)
CO2: 25 mmol/L (ref 18–29)
Calcium: 9.5 mg/dL (ref 8.7–10.2)
Creatinine, Ser: 1.21 mg/dL (ref 0.76–1.27)
GFR calc Af Amer: 76 mL/min/{1.73_m2} (ref >59)
GFR calc non Af Amer: 66 mL/min/{1.73_m2} (ref >59)
Globulin, Total: 2.7 g/dL (ref 1.5–4.5)
Glucose: 112 mg/dL — ABNORMAL HIGH (ref 65–99)
Potassium: 4.9 mmol/L (ref 3.5–5.2)
Sodium: 137 mmol/L (ref 134–144)
Total Protein: 7.2 g/dL (ref 6.0–8.5)

## 2014-10-16 LAB — LIPID PANEL
Chol/HDL Ratio: 2.8 ratio units (ref 0.0–5.0)
Cholesterol, Total: 113 mg/dL (ref 100–199)
HDL: 40 mg/dL (ref >39)
LDL Calculated: 34 mg/dL (ref 0–99)
Triglycerides: 197 mg/dL — ABNORMAL HIGH (ref 0–149)
VLDL Cholesterol Cal: 39 mg/dL (ref 5–40)

## 2014-10-16 LAB — PSA, TOTAL AND FREE
PROSTATE SPECIFIC AG, SERUM: 0.5 ng/mL (ref 0.0–4.0)
PSA FREE PCT: 32 %
PSA, Free: 0.16 ng/mL

## 2014-10-16 LAB — VITAMIN D 25 HYDROXY (VIT D DEFICIENCY, FRACTURES): VIT D 25 HYDROXY: 42 ng/mL (ref 30.0–100.0)

## 2014-10-17 ENCOUNTER — Telehealth: Payer: Self-pay

## 2014-10-17 NOTE — Telephone Encounter (Signed)
Insurance prior authorized Androgel through 10/17/2015

## 2014-10-21 ENCOUNTER — Other Ambulatory Visit: Payer: Self-pay | Admitting: Family

## 2014-10-21 ENCOUNTER — Encounter: Payer: Self-pay | Admitting: Family

## 2014-10-21 DIAGNOSIS — R0989 Other specified symptoms and signs involving the circulatory and respiratory systems: Secondary | ICD-10-CM

## 2014-10-21 DIAGNOSIS — I6523 Occlusion and stenosis of bilateral carotid arteries: Secondary | ICD-10-CM

## 2014-10-22 ENCOUNTER — Ambulatory Visit: Payer: BC Managed Care – PPO | Admitting: Family

## 2014-10-22 ENCOUNTER — Other Ambulatory Visit (HOSPITAL_COMMUNITY): Payer: BC Managed Care – PPO

## 2014-10-22 ENCOUNTER — Encounter (HOSPITAL_COMMUNITY): Payer: Self-pay

## 2014-11-25 ENCOUNTER — Encounter: Payer: Self-pay | Admitting: Family

## 2014-11-27 ENCOUNTER — Ambulatory Visit (HOSPITAL_COMMUNITY)
Admission: RE | Admit: 2014-11-27 | Discharge: 2014-11-27 | Disposition: A | Discharge status: Home / Self Care | Payer: BLUE CROSS/BLUE SHIELD | Source: Ambulatory Visit | Attending: Family | Admitting: Family

## 2014-11-27 ENCOUNTER — Encounter: Payer: Self-pay | Admitting: Family

## 2014-11-27 ENCOUNTER — Other Ambulatory Visit: Payer: Self-pay | Admitting: *Deleted

## 2014-11-27 ENCOUNTER — Ambulatory Visit (INDEPENDENT_AMBULATORY_CARE_PROVIDER_SITE_OTHER): Payer: BLUE CROSS/BLUE SHIELD | Admitting: Family

## 2014-11-27 VITALS — BP 125/75 | HR 72 | Temp 98.2°F | Resp 16 | Ht 72.0 in | Wt 290.0 lb

## 2014-11-27 DIAGNOSIS — I6523 Occlusion and stenosis of bilateral carotid arteries: Secondary | ICD-10-CM | POA: Diagnosis not present

## 2014-11-27 DIAGNOSIS — E785 Hyperlipidemia, unspecified: Secondary | ICD-10-CM | POA: Diagnosis not present

## 2014-11-27 DIAGNOSIS — E119 Type 2 diabetes mellitus without complications: Secondary | ICD-10-CM | POA: Diagnosis not present

## 2014-11-27 DIAGNOSIS — Z87891 Personal history of nicotine dependence: Secondary | ICD-10-CM

## 2014-11-27 DIAGNOSIS — I10 Essential (primary) hypertension: Secondary | ICD-10-CM | POA: Insufficient documentation

## 2014-11-27 NOTE — Patient Instructions (Signed)
Stroke Prevention Some medical conditions and behaviors are associated with an increased chance of having a stroke. You may prevent a stroke by making healthy choices and managing medical conditions. HOW CAN I REDUCE MY RISK OF HAVING A STROKE?   Stay physically active. Get at least 30 minutes of activity on most or all days.  Do not smoke. It may also be helpful to avoid exposure to secondhand smoke.  Limit alcohol use. Moderate alcohol use is considered to be:  No more than 2 drinks per day for men.  No more than 1 drink per day for nonpregnant women.  Eat healthy foods. This involves:  Eating 5 or more servings of fruits and vegetables a day.  Making dietary changes that address high blood pressure (hypertension), high cholesterol, diabetes, or obesity.  Manage your cholesterol levels.  Making food choices that are high in fiber and low in saturated fat, trans fat, and cholesterol may control cholesterol levels.  Take any prescribed medicines to control cholesterol as directed by your health care provider.  Manage your diabetes.  Controlling your carbohydrate and sugar intake is recommended to manage diabetes.  Take any prescribed medicines to control diabetes as directed by your health care provider.  Control your hypertension.  Making food choices that are low in salt (sodium), saturated fat, trans fat, and cholesterol is recommended to manage hypertension.  Take any prescribed medicines to control hypertension as directed by your health care provider.  Maintain a healthy weight.  Reducing calorie intake and making food choices that are low in sodium, saturated fat, trans fat, and cholesterol are recommended to manage weight.  Stop drug abuse.  Avoid taking birth control pills.  Talk to your health care provider about the risks of taking birth control pills if you are over 35 years old, smoke, get migraines, or have ever had a blood clot.  Get evaluated for sleep  disorders (sleep apnea).  Talk to your health care provider about getting a sleep evaluation if you snore a lot or have excessive sleepiness.  Take medicines only as directed by your health care provider.  For some people, aspirin or blood thinners (anticoagulants) are helpful in reducing the risk of forming abnormal blood clots that can lead to stroke. If you have the irregular heart rhythm of atrial fibrillation, you should be on a blood thinner unless there is a good reason you cannot take them.  Understand all your medicine instructions.  Make sure that other conditions (such as anemia or atherosclerosis) are addressed. SEEK IMMEDIATE MEDICAL CARE IF:   You have sudden weakness or numbness of the face, arm, or leg, especially on one side of the body.  Your face or eyelid droops to one side.  You have sudden confusion.  You have trouble speaking (aphasia) or understanding.  You have sudden trouble seeing in one or both eyes.  You have sudden trouble walking.  You have dizziness.  You have a loss of balance or coordination.  You have a sudden, severe headache with no known cause.  You have new chest pain or an irregular heartbeat. Any of these symptoms may represent a serious problem that is an emergency. Do not wait to see if the symptoms will go away. Get medical help at once. Call your local emergency services (911 in [place] Do not drive yourself to the hospital. Document Released: 04/01/2004 Document Revised: 07/09/2013 Document Reviewed: 08/25/2012 ExitCare Patient Information 2015 [employer]. This information is not intended to replace advice given   to you by your health care provider. Make sure you discuss any questions you have with your health care provider.  
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
## 2015-01-07 ENCOUNTER — Other Ambulatory Visit: Payer: Self-pay | Admitting: Family Medicine

## 2015-01-23 ENCOUNTER — Other Ambulatory Visit: Payer: Self-pay | Admitting: Family Medicine

## 2015-01-26 ENCOUNTER — Other Ambulatory Visit: Payer: Self-pay | Admitting: Family Medicine

## 2015-02-14 ENCOUNTER — Ambulatory Visit (INDEPENDENT_AMBULATORY_CARE_PROVIDER_SITE_OTHER): Payer: BLUE CROSS/BLUE SHIELD | Admitting: Family Medicine

## 2015-02-14 ENCOUNTER — Encounter: Payer: Self-pay | Admitting: Family Medicine

## 2015-02-14 VITALS — BP 112/60 | HR 74 | Temp 97.7°F | Ht 72.0 in | Wt 293.4 lb

## 2015-02-14 DIAGNOSIS — E1121 Type 2 diabetes mellitus with diabetic nephropathy: Secondary | ICD-10-CM

## 2015-02-14 DIAGNOSIS — E291 Testicular hypofunction: Secondary | ICD-10-CM

## 2015-02-14 DIAGNOSIS — E1169 Type 2 diabetes mellitus with other specified complication: Secondary | ICD-10-CM | POA: Diagnosis not present

## 2015-02-14 DIAGNOSIS — I10 Essential (primary) hypertension: Secondary | ICD-10-CM | POA: Diagnosis not present

## 2015-02-14 DIAGNOSIS — E785 Hyperlipidemia, unspecified: Secondary | ICD-10-CM

## 2015-02-14 DIAGNOSIS — E1142 Type 2 diabetes mellitus with diabetic polyneuropathy: Secondary | ICD-10-CM

## 2015-02-14 LAB — POCT GLYCOSYLATED HEMOGLOBIN (HGB A1C): HEMOGLOBIN A1C: 6.3

## 2015-02-14 MED ORDER — TESTOSTERONE 20.25 MG/ACT (1.62%) TD GEL: 2.0000 "application " | Freq: Every day | TRANSDERMAL | Status: DC

## 2015-02-14 NOTE — Patient Instructions (Signed)
Diabetes and Foot Care Diabetes may cause you to have problems because of poor blood supply (circulation) to your feet and legs. This may cause the skin on your feet to become thinner, break easier, and heal more slowly. Your skin may become dry, and the skin may peel and crack. You may also have nerve damage in your legs and feet causing decreased feeling in them. You may not notice minor injuries to your feet that could lead to infections or more serious problems. Taking care of your feet is one of the most important things you can do for yourself.  HOME CARE INSTRUCTIONS  Wear shoes at all times, even in the house. Do not go barefoot. Bare feet are easily injured.  Check your feet daily for blisters, cuts, and redness. If you cannot see the bottom of your feet, use a mirror or ask someone for help.  Wash your feet with warm water (do not use hot water) and mild soap. Then pat your feet and the areas between your toes until they are completely dry. Do not soak your feet as this can dry your skin.  Apply a moisturizing lotion or petroleum jelly (that does not contain alcohol and is unscented) to the skin on your feet and to dry, brittle toenails. Do not apply lotion between your toes.  Trim your toenails straight across. Do not dig under them or around the cuticle. File the edges of your nails with an emery board or nail file.  Do not cut corns or calluses or try to remove them with medicine.  Wear clean socks or stockings every day. Make sure they are not too tight. Do not wear knee-high stockings since they may decrease blood flow to your legs.  Wear shoes that fit properly and have enough cushioning. To break in new shoes, wear them for just a few hours a day. This prevents you from injuring your feet. Always look in your shoes before you put them on to be sure there are no objects inside.  Do not cross your legs. This may decrease the blood flow to your feet.  If you find a minor scrape,  cut, or break in the skin on your feet, keep it and the skin around it clean and dry. These areas may be cleansed with mild soap and water. Do not cleanse the area with peroxide, alcohol, or iodine.  When you remove an adhesive bandage, be sure not to damage the skin around it.  If you have a wound, look at it several times a day to make sure it is healing.  Do not use heating pads or hot water bottles. They may burn your skin. If you have lost feeling in your feet or legs, you may not know it is happening until it is too late.  Make sure your health care provider performs a complete foot exam at least annually or more often if you have foot problems. Report any cuts, sores, or bruises to your health care provider immediately. SEEK MEDICAL CARE IF:   You have an injury that is not healing.  You have cuts or breaks in the skin.  You have an ingrown nail.  You notice redness on your legs or feet.  You feel burning or tingling in your legs or feet.  You have pain or cramps in your legs and feet.  Your legs or feet are numb.  Your feet always feel cold. SEEK IMMEDIATE MEDICAL CARE IF:   There is increasing redness,   swelling, or pain in or around a wound.  There is a red line that goes up your leg.  Pus is coming from a wound.  You develop a fever or as directed by your health care provider.  You notice a bad smell coming from an ulcer or wound.   This information is not intended to replace advice given to you by your health care provider. Make sure you discuss any questions you have with your health care provider.   Document Released: 02/20/2000 Document Revised: 10/25/2012 Document Reviewed: 08/01/2012 Elsevier Interactive Patient Education 2016 [employer] Basic Carbohydrate Counting for Diabetes Mellitus Carbohydrate counting is a method for keeping track of the amount of carbohydrates you eat. Eating carbohydrates naturally increases the level of sugar (glucose) in your  blood, so it is important for you to know the amount that is okay for you to have in every meal. Carbohydrate counting helps keep the level of glucose in your blood within normal limits. The amount of carbohydrates allowed is different for every person. A dietitian can help you calculate the amount that is right for you. Once you know the amount of carbohydrates you can have, you can count the carbohydrates in the foods you want to eat. Carbohydrates are found in the following foods:  Grains, such as breads and cereals.  Dried beans and soy products.  Starchy vegetables, such as potatoes, peas, and corn.  Fruit and fruit juices.  Milk and yogurt.  Sweets and snack foods, such as cake, cookies, candy, chips, soft drinks, and fruit drinks. CARBOHYDRATE COUNTING There are two ways to count the carbohydrates in your food. You can use either of the methods or a combination of both. Reading the "Nutrition Facts" on Packaged Food The "Nutrition Facts" is an area that is included on the labels of almost all packaged food and beverages in the [place]. It includes the serving size of that food or beverage and information about the nutrients in each serving of the food, including the grams (g) of carbohydrate per serving.  Decide the number of servings of this food or beverage that you will be able to eat or drink. Multiply that number of servings by the number of grams of carbohydrate that is listed on the label for that serving. The total will be the amount of carbohydrates you will be having when you eat or drink this food or beverage. Learning Standard Serving Sizes of Food When you eat food that is not packaged or does not include "Nutrition Facts" on the label, you need to measure the servings in order to count the amount of carbohydrates.A serving of most carbohydrate-rich foods contains about 15 g of carbohydrates. The following list includes serving sizes of carbohydrate-rich foods that  provide 15 g ofcarbohydrate per serving:   1 slice of bread (1 oz) or 1 six-inch tortilla.    of a hamburger bun or English muffin.  4-6 crackers.   cup unsweetened dry cereal.    cup hot cereal.   cup rice or pasta.    cup mashed potatoes or  of a large baked potato.  1 cup fresh fruit or one small piece of fruit.    cup canned or frozen fruit or fruit juice.  1 cup milk.   cup plain fat-free yogurt or yogurt sweetened with artificial sweeteners.   cup cooked dried beans or starchy vegetable, such as peas, corn, or potatoes.  Decide the number of standard-size servings that you will eat. Multiply that   number of servings by 15 (the grams of carbohydrates in that serving). For example, if you eat 2 cups of strawberries, you will have eaten 2 servings and 30 g of carbohydrates (2 servings x 15 g = 30 g). For foods such as soups and casseroles, in which more than one food is mixed in, you will need to count the carbohydrates in each food that is included. EXAMPLE OF CARBOHYDRATE COUNTING Sample Dinner  3 oz chicken breast.   cup of brown rice.   cup of corn.  1 cup milk.   1 cup strawberries with sugar-free whipped topping.  Carbohydrate Calculation Step 1: Identify the foods that contain carbohydrates:   Rice.   Corn.   Milk.   Strawberries. Step 2:Calculate the number of servings eaten of each:   2 servings of rice.   1 serving of corn.   1 serving of milk.   1 serving of strawberries. Step 3: Multiply each of those number of servings by 15 g:   2 servings of rice x 15 g = 30 g.   1 serving of corn x 15 g = 15 g.   1 serving of milk x 15 g = 15 g.   1 serving of strawberries x 15 g = 15 g. Step 4: Add together all of the amounts to find the total grams of carbohydrates eaten: 30 g + 15 g + 15 g + 15 g = 75 g.   This information is not intended to replace advice given to you by your health care provider. Make sure you  discuss any questions you have with your health care provider.   Document Released: 02/22/2005 Document Revised: 03/15/2014 Document Reviewed: 01/19/2013 Elsevier Interactive Patient Education 2016 [employer]vier Inc[employer]  

## 2015-02-14 NOTE — Progress Notes (Signed)
Subjective:  Patient ID: Eugene Bautista, male    DOB: Aug 24, 1955  Age: 59 y.o. MRN: [id number]  CC: No chief complaint on file.   HPI Eugene Bautista presents for  follow-up of hypertension. Patient has no history of headache chest pain or shortness of breath or recent cough. Patient also denies symptoms of TIA such as numbness weakness lateralizing. Patient checks  blood pressure at home and has not had any elevated readings recently. Patient denies side effects from his medication. States taking it regularly.  Patient also  in for follow-up of elevated cholesterol. Doing well without complaints on current medication. Denies side effects of statin including myalgia and arthralgia and nausea. Also in today for liver function testing. Currently no chest pain, shortness of breath or other cardiovascular related symptoms noted.  Follow-up of diabetes. Patient does check blood sugar at home. Readings run okay with certain foods, but around 200 with others. No log. Patient denies symptoms such as polyuria, polydipsia, excessive hunger, nausea No significant hypoglycemic spells noted. Medications as noted below. Taking them regularly without complication/adverse reaction being reported today.   Patient also in for recheck of his hypogonadism. Symptoms had been lassitude and muscle weakness. Currently she is symptom-free. Medication agrees with him without irritating the skin. Using it daily as directed.   History Eugene Bautista has a past medical history of Hyperlipidemia; Hypertension; Left carotid bruit; Diabetes mellitus without complication ([name]); Obesity; and Carotid artery occlusion.   He has past surgical history that includes Ruptured Aorta Repair (1989); Urinary surgery (1989); and Fracture surgery (Left, 1989).   His family history includes Cancer in his brother; Dementia in his mother; Diabetes in his father; Heart attack (age of onset: 46) in his father; Heart disease in his  father; Kidney disease in his father.He reports that he quit smoking about 13 years ago. His smoking use included Cigarettes. He has never used smokeless tobacco. He reports that he drinks about 3.6 oz of alcohol per week. He reports that he does not use illicit drugs.  Current Outpatient Prescriptions on File Prior to Visit  Medication Sig Dispense Refill  . amLODipine (NORVASC) 10 MG tablet TAKE 1 TABLET DAILY 90 tablet 0  . aspirin 81 MG tablet Take 81 mg by mouth daily.    . canagliflozin (INVOKANA) 300 MG TABS tablet Take 300 mg by mouth daily before breakfast. (Patient taking differently: Take 150 mg by mouth daily before breakfast. ) 90 tablet 1  . cholecalciferol (VITAMIN D) 1000 UNITS tablet Take 2,000 Units by mouth daily.    . fish oil-omega-3 fatty acids 1000 MG capsule Take 1 g by mouth 2 (two) times daily.    Marland Kitchen glipiZIDE (GLUCOTROL) 5 MG tablet TAKE 1 TABLET EVERY MORNING 90 tablet 0  . JANUMET 50-1000 MG tablet TAKE 1 TABLET TWICE A DAY 180 tablet 0  . metoprolol succinate (TOPROL-XL) 25 MG 24 hr tablet Take 1 tablet (25 mg total) by mouth daily. 90 tablet 3  . rosuvastatin (CRESTOR) 10 MG tablet TAKE 1 TABLET AT BEDTIME 90 tablet 0  . valsartan-hydrochlorothiazide (DIOVAN-HCT) 320-25 MG per tablet Take 1 tablet by mouth daily. 90 tablet 1   No current facility-administered medications on file prior to visit.    ROS Review of Systems  Constitutional: Negative for fever, chills, diaphoresis and unexpected weight change.  HENT: Negative for congestion, hearing loss, rhinorrhea and sore throat.   Eyes: Negative for visual disturbance.  Respiratory: Negative for cough and shortness of breath.  Cardiovascular: Negative for chest pain.  Gastrointestinal: Negative for abdominal pain, diarrhea and constipation.  Genitourinary: Negative for dysuria and flank pain.  Musculoskeletal: Negative for joint swelling and arthralgias.  Skin: Negative for rash.  Neurological: Negative for  dizziness and headaches.  Psychiatric/Behavioral: Negative for sleep disturbance and dysphoric mood.    Objective:  BP 112/60 mmHg  Pulse 74  Temp(Src) 97.7 F (36.5 C) (Oral)  Ht 6' (1.829 m)  Wt 293 lb 6.4 oz (133.085 kg)  BMI 39.78 kg/m2  SpO2 96%  BP Readings from Last 3 Encounters:  02/14/15 112/60  11/27/14 125/75  10/15/14 137/79    Wt Readings from Last 3 Encounters:  02/14/15 293 lb 6.4 oz (133.085 kg)  11/27/14 290 lb (131.543 kg)  10/15/14 291 lb (131.997 kg)     Physical Exam  Constitutional: He is oriented to person, place, and time. He appears well-developed and well-nourished. No distress.  HENT:  Head: Normocephalic and atraumatic.  Right Ear: External ear normal.  Left Ear: External ear normal.  Nose: Nose normal.  Mouth/Throat: Oropharynx is clear and moist.  Eyes: Conjunctivae and EOM are normal. Pupils are equal, round, and reactive to light.  Neck: Normal range of motion. Neck supple. No thyromegaly present.  Cardiovascular: Normal rate, regular rhythm and normal heart sounds.   No murmur heard. Pulmonary/Chest: Effort normal and breath sounds normal. No respiratory distress. He has no wheezes. He has no rales.  Abdominal: Soft. Bowel sounds are normal. He exhibits no distension. There is no tenderness.  Lymphadenopathy:    He has no cervical adenopathy.  Neurological: He is alert and oriented to person, place, and time. He has normal reflexes.  Skin: Skin is warm and dry.  Psychiatric: He has a normal mood and affect. His behavior is normal. Judgment and thought content normal.   Diabetic Foot Exam - Simple   Simple Foot Form  Diabetic Foot exam was performed with the following findings:  Yes 02/14/2015  9:08 AM  Visual Inspection  No deformities, no ulcerations, no other skin breakdown bilaterally:  Yes  See comments:  Yes  Sensation Testing  Intact to touch and monofilament testing bilaterally:  Yes  Pulse Check  Posterior Tibialis and  Dorsalis pulse intact bilaterally:  Yes  Comments      Lab Results  Component Value Date   HGBA1C 6.5 10/15/2014   HGBA1C 6.4 01/01/2014   HGBA1C 6.7 08/06/2013    Lab Results  Component Value Date   WBC 8.7 10/15/2014   HGB 17.8* 01/01/2014   HCT 47.6 10/15/2014   PLT 211 01/01/2014   GLUCOSE 112* 10/15/2014   CHOL 113 10/15/2014   TRIG 197* 10/15/2014   HDL 40 10/15/2014   LDLCALC 34 10/15/2014   ALT 25 10/15/2014   AST 17 10/15/2014   NA 137 10/15/2014   K 4.9 10/15/2014   CL 95* 10/15/2014   CREATININE 1.21 10/15/2014   BUN 27* 10/15/2014   CO2 25 10/15/2014   TSH 1.430 08/06/2013   PSA 0.5 10/15/2014   HGBA1C 6.5 10/15/2014    No results found.  Assessment & Plan:   Diagnoses and all orders for this visit:  Diabetic polyneuropathy associated with type 2 diabetes mellitus ([place])  Type 2 diabetes mellitus with diabetic nephropathy (HCC) -     POCT glycosylated hemoglobin (Hb A1C) -     CMP14+EGFR -     Microalbumin / creatinine urine ratio  Dyslipidemia associated with type 2 diabetes mellitus ([place]) -  CMP14+EGFR -     Lipid panel  Essential hypertension -     CMP14+EGFR -     CBC with Differential/Platelet  Hypogonadism, testicular -     Testosterone (ANDROGEL PUMP) 20.25 MG/ACT (1.62%) GEL; Place 2 application onto the skin daily.   I am having Mr. Colee maintain his aspirin, fish oil-omega-3 fatty acids, cholecalciferol, canagliflozin, metoprolol succinate, valsartan-hydrochlorothiazide, glipiZIDE, amLODipine, rosuvastatin, JANUMET, and Testosterone.  Meds ordered this encounter  Medications  . Testosterone (ANDROGEL PUMP) 20.25 MG/ACT (1.62%) GEL    Sig: Place 2 application onto the skin daily.    Dispense:  225 g    Refill:  1     Follow-up: Return in about 3 months (around 05/15/2015) for diabetes, hypertension.  Claretta Fraise, M.D.

## 2015-02-15 LAB — CBC WITH DIFFERENTIAL/PLATELET
BASOS: 0 %
Basophils Absolute: 0 10*3/uL (ref 0.0–0.2)
EOS (ABSOLUTE): 0.3 10*3/uL (ref 0.0–0.4)
EOS: 3 %
HEMATOCRIT: 48.6 % (ref 37.5–51.0)
Hemoglobin: 17.2 g/dL (ref 12.6–17.7)
IMMATURE GRANS (ABS): 0 10*3/uL (ref 0.0–0.1)
IMMATURE GRANULOCYTES: 0 %
LYMPHS: 23 %
Lymphocytes Absolute: 2.3 10*3/uL (ref 0.7–3.1)
MCH: 31.5 pg (ref 26.6–33.0)
MCHC: 35.4 g/dL (ref 31.5–35.7)
MCV: 89 fL (ref 79–97)
MONOS ABS: 1.2 10*3/uL — AB (ref 0.1–0.9)
Monocytes: 12 %
NEUTROS ABS: 6 10*3/uL (ref 1.4–7.0)
NEUTROS PCT: 62 %
Platelets: 205 10*3/uL (ref 150–379)
RBC: 5.46 x10E6/uL (ref 4.14–5.80)
RDW: 13.7 % (ref 12.3–15.4)
WBC: 9.8 10*3/uL (ref 3.4–10.8)

## 2015-02-15 LAB — CMP14+EGFR
A/G RATIO: 1.8 (ref 1.1–2.5)
ALBUMIN: 4.6 g/dL (ref 3.5–5.5)
ALK PHOS: 67 IU/L (ref 39–117)
ALT: 26 IU/L (ref 0–44)
AST: 18 IU/L (ref 0–40)
BILIRUBIN TOTAL: 0.8 mg/dL (ref 0.0–1.2)
BUN / CREAT RATIO: 18 (ref 9–20)
BUN: 22 mg/dL (ref 6–24)
CHLORIDE: 97 mmol/L (ref 97–106)
CO2: 26 mmol/L (ref 18–29)
Calcium: 9.6 mg/dL (ref 8.7–10.2)
Creatinine, Ser: 1.24 mg/dL (ref 0.76–1.27)
GFR calc Af Amer: 74 mL/min/{1.73_m2} (ref >59)
GFR calc non Af Amer: 64 mL/min/{1.73_m2} (ref >59)
GLOBULIN, TOTAL: 2.5 g/dL (ref 1.5–4.5)
Glucose: 108 mg/dL — ABNORMAL HIGH (ref 65–99)
Potassium: 4.3 mmol/L (ref 3.5–5.2)
Sodium: 138 mmol/L (ref 136–144)
Total Protein: 7.1 g/dL (ref 6.0–8.5)

## 2015-02-15 LAB — LIPID PANEL
CHOLESTEROL TOTAL: 100 mg/dL (ref 100–199)
Chol/HDL Ratio: 2.5 ratio units (ref 0.0–5.0)
HDL: 40 mg/dL (ref >39)
LDL Calculated: 30 mg/dL (ref 0–99)
TRIGLYCERIDES: 149 mg/dL (ref 0–149)
VLDL CHOLESTEROL CAL: 30 mg/dL (ref 5–40)

## 2015-02-15 LAB — MICROALBUMIN / CREATININE URINE RATIO
Creatinine, Urine: 88.7 mg/dL
MICROALB/CREAT RATIO: 6.1 mg/g{creat} (ref 0.0–30.0)
Microalbumin, Urine: 5.4 ug/mL

## 2015-02-23 ENCOUNTER — Other Ambulatory Visit: Payer: Self-pay | Admitting: Family Medicine

## 2015-04-07 ENCOUNTER — Other Ambulatory Visit: Payer: Self-pay | Admitting: Family Medicine

## 2015-04-07 DIAGNOSIS — I10 Essential (primary) hypertension: Secondary | ICD-10-CM

## 2015-04-08 ENCOUNTER — Encounter: Payer: Self-pay | Admitting: *Deleted

## 2015-04-08 MED ORDER — VALSARTAN-HYDROCHLOROTHIAZIDE 320-25 MG PO TABS: 1.0000 | ORAL_TABLET | Freq: Every day | ORAL | Status: DC

## 2015-04-08 MED ORDER — AMLODIPINE BESYLATE 10 MG PO TABS: 10.0000 mg | ORAL_TABLET | Freq: Every day | ORAL | Status: DC

## 2015-04-08 MED ORDER — SITAGLIPTIN PHOS-METFORMIN HCL 50-1000 MG PO TABS: 1.0000 | ORAL_TABLET | Freq: Two times a day (BID) | ORAL | Status: DC

## 2015-04-08 MED ORDER — ROSUVASTATIN CALCIUM 10 MG PO TABS
10.0000 mg | ORAL_TABLET | Freq: Every day | ORAL | Status: DC
Start: 2015-04-08 — End: 2015-08-29

## 2015-04-08 MED ORDER — GLIPIZIDE 5 MG PO TABS: 5.0000 mg | ORAL_TABLET | Freq: Every morning | ORAL | Status: DC

## 2015-04-08 MED ORDER — CANAGLIFLOZIN 300 MG PO TABS: 300.0000 mg | ORAL_TABLET | Freq: Every day | ORAL | Status: DC

## 2015-04-08 MED ORDER — METOPROLOL SUCCINATE ER 25 MG PO TB24: 25.0000 mg | ORAL_TABLET | Freq: Every day | ORAL | Status: DC

## 2015-04-08 NOTE — Telephone Encounter (Signed)
done

## 2015-04-16 ENCOUNTER — Telehealth: Payer: Self-pay

## 2015-04-16 NOTE — Telephone Encounter (Signed)
Insurance prior authorized Invokana 300 mg through 04/08/16

## 2015-05-16 ENCOUNTER — Ambulatory Visit: Payer: BLUE CROSS/BLUE SHIELD | Admitting: Family Medicine

## 2015-05-20 ENCOUNTER — Ambulatory Visit: Payer: BLUE CROSS/BLUE SHIELD | Admitting: Family Medicine

## 2015-05-23 ENCOUNTER — Ambulatory Visit: Payer: BLUE CROSS/BLUE SHIELD | Admitting: Family Medicine

## 2015-06-12 ENCOUNTER — Other Ambulatory Visit: Payer: BLUE CROSS/BLUE SHIELD

## 2015-06-12 DIAGNOSIS — I10 Essential (primary) hypertension: Secondary | ICD-10-CM | POA: Diagnosis not present

## 2015-06-12 DIAGNOSIS — E1121 Type 2 diabetes mellitus with diabetic nephropathy: Secondary | ICD-10-CM | POA: Diagnosis not present

## 2015-06-12 DIAGNOSIS — E1169 Type 2 diabetes mellitus with other specified complication: Secondary | ICD-10-CM

## 2015-06-12 DIAGNOSIS — E785 Hyperlipidemia, unspecified: Principal | ICD-10-CM

## 2015-06-12 LAB — BAYER DCA HB A1C WAIVED: HB A1C: 6.6 % (ref <7.0)

## 2015-06-13 LAB — LIPID PANEL
CHOL/HDL RATIO: 2.7 ratio (ref 0.0–5.0)
CHOLESTEROL TOTAL: 119 mg/dL (ref 100–199)
HDL: 44 mg/dL (ref >39)
LDL CALC: 43 mg/dL (ref 0–99)
TRIGLYCERIDES: 159 mg/dL — AB (ref 0–149)
VLDL CHOLESTEROL CAL: 32 mg/dL (ref 5–40)

## 2015-06-13 LAB — CMP14+EGFR
ALBUMIN: 4.4 g/dL (ref 3.5–5.5)
ALT: 23 IU/L (ref 0–44)
AST: 16 IU/L (ref 0–40)
Albumin/Globulin Ratio: 1.7 (ref 1.2–2.2)
Alkaline Phosphatase: 70 IU/L (ref 39–117)
BUN / CREAT RATIO: 20 (ref 9–20)
BUN: 23 mg/dL (ref 6–24)
Bilirubin Total: 0.7 mg/dL (ref 0.0–1.2)
CALCIUM: 9.4 mg/dL (ref 8.7–10.2)
CO2: 23 mmol/L (ref 18–29)
CREATININE: 1.17 mg/dL (ref 0.76–1.27)
Chloride: 99 mmol/L (ref 96–106)
GFR calc Af Amer: 79 mL/min/{1.73_m2} (ref >59)
GFR, EST NON AFRICAN AMERICAN: 68 mL/min/{1.73_m2} (ref >59)
GLUCOSE: 150 mg/dL — AB (ref 65–99)
Globulin, Total: 2.6 g/dL (ref 1.5–4.5)
Potassium: 4.5 mmol/L (ref 3.5–5.2)
Sodium: 141 mmol/L (ref 134–144)
TOTAL PROTEIN: 7 g/dL (ref 6.0–8.5)

## 2015-06-17 ENCOUNTER — Encounter (INDEPENDENT_AMBULATORY_CARE_PROVIDER_SITE_OTHER): Payer: Self-pay

## 2015-06-17 ENCOUNTER — Encounter: Payer: Self-pay | Admitting: *Deleted

## 2015-06-17 ENCOUNTER — Encounter: Payer: Self-pay | Admitting: Family Medicine

## 2015-06-17 ENCOUNTER — Ambulatory Visit (INDEPENDENT_AMBULATORY_CARE_PROVIDER_SITE_OTHER): Payer: BLUE CROSS/BLUE SHIELD

## 2015-06-17 ENCOUNTER — Ambulatory Visit (INDEPENDENT_AMBULATORY_CARE_PROVIDER_SITE_OTHER): Payer: BLUE CROSS/BLUE SHIELD | Admitting: Family Medicine

## 2015-06-17 VITALS — BP 127/71 | HR 76 | Temp 97.0°F | Ht 72.0 in | Wt 291.0 lb

## 2015-06-17 DIAGNOSIS — R253 Fasciculation: Secondary | ICD-10-CM

## 2015-06-17 DIAGNOSIS — R258 Other abnormal involuntary movements: Secondary | ICD-10-CM | POA: Diagnosis not present

## 2015-06-17 DIAGNOSIS — M25511 Pain in right shoulder: Secondary | ICD-10-CM | POA: Diagnosis not present

## 2015-06-17 DIAGNOSIS — E1121 Type 2 diabetes mellitus with diabetic nephropathy: Secondary | ICD-10-CM | POA: Diagnosis not present

## 2015-06-17 DIAGNOSIS — M25519 Pain in unspecified shoulder: Secondary | ICD-10-CM | POA: Insufficient documentation

## 2015-06-17 DIAGNOSIS — I10 Essential (primary) hypertension: Secondary | ICD-10-CM | POA: Diagnosis not present

## 2015-06-17 DIAGNOSIS — E785 Hyperlipidemia, unspecified: Secondary | ICD-10-CM

## 2015-06-17 DIAGNOSIS — E291 Testicular hypofunction: Secondary | ICD-10-CM | POA: Diagnosis not present

## 2015-06-17 MED ORDER — CYCLOBENZAPRINE HCL 10 MG PO TABS: 10.0000 mg | ORAL_TABLET | Freq: Three times a day (TID) | ORAL | Status: DC | PRN

## 2015-06-17 MED ORDER — TESTOSTERONE 4 MG/24HR TD PT24: 1.0000 | MEDICATED_PATCH | Freq: Every day | TRANSDERMAL | Status: DC

## 2015-06-17 MED ORDER — PREDNISONE 10 MG PO TABS: ORAL_TABLET | ORAL | Status: DC

## 2015-06-17 NOTE — Progress Notes (Signed)
Subjective:  Patient ID: Eugene Bautista, male    DOB: February 16, 1956  Age: 60 y.o. MRN: [id number]  CC: Diabetes; Hypertension; and Hyperlipidemia   HPI Eugene Bautista presents for  follow-up of hypertension. Patient has no history of headache chest pain or shortness of breath or recent cough. Patient also denies symptoms of TIA such as numbness weakness lateralizing. Patient checks  blood pressure at home and has not had any elevated readings recently. Patient denies side effects from his medication. States taking it regularly.  Patient also  in for follow-up of elevated cholesterol. Doing well without complaints on current medication. Denies side effects of statin including myalgia and arthralgia and nausea. Also in today for liver function testing. Currently no chest pain, shortness of breath or other cardiovascular related symptoms noted.  Follow-up of diabetes. Patient does not check blood sugar at home. Patient denies symptoms such as polyuria, polydipsia, excessive hunger, nausea. No significant hypoglycemic spells noted. Medications as noted below. Taking them regularly without complication/adverse reaction being reported today.   Follow-up of hypogonadism. Patient states that he did not like using the AndroGel. It was too messy. He is concerned about getting it on his grandchildren. It did not seem to be effective while he used it several months ago. He is due to have that rechecked. Muscle twitches have been noted. He is concerned that testosterone deficiency may be causative.   History Eugene Bautista has a past medical history of Hyperlipidemia; Hypertension; Left carotid bruit; Diabetes mellitus without complication (HCC); Obesity; and Carotid artery occlusion.   He has past surgical history that includes Ruptured Aorta Repair (1989); Urinary surgery (1989); and Fracture surgery (Left, 1989).   His family history includes Cancer in his brother; Dementia in his mother; Diabetes in  his father; Heart attack (age of onset: 50) in his father; Heart disease in his father; Kidney disease in his father.He reports that he quit smoking about 14 years ago. His smoking use included Cigarettes. He has never used smokeless tobacco. He reports that he drinks about 3.6 oz of alcohol per week. He reports that he does not use illicit drugs.  Current Outpatient Prescriptions on File Prior to Visit  Medication Sig Dispense Refill  . amLODipine (NORVASC) 10 MG tablet Take 1 tablet (10 mg total) by mouth daily. 90 tablet 1  . aspirin 81 MG tablet Take 81 mg by mouth daily.    . canagliflozin (INVOKANA) 300 MG TABS tablet Take 1 tablet (300 mg total) by mouth daily before breakfast. (Patient taking differently: Take 150 mg by mouth daily before breakfast. ) 90 tablet 1  . cholecalciferol (VITAMIN D) 1000 UNITS tablet Take 2,000 Units by mouth daily.    . fish oil-omega-3 fatty acids 1000 MG capsule Take 1 g by mouth 2 (two) times daily.    Marland Kitchen glipiZIDE (GLUCOTROL) 5 MG tablet Take 1 tablet (5 mg total) by mouth every morning. 90 tablet 1  . metoprolol succinate (TOPROL-XL) 25 MG 24 hr tablet Take 1 tablet (25 mg total) by mouth daily. 90 tablet 1  . rosuvastatin (CRESTOR) 10 MG tablet Take 1 tablet (10 mg total) by mouth at bedtime. 90 tablet 1  . sitaGLIPtin-metformin (JANUMET) 50-1000 MG tablet Take 1 tablet by mouth 2 (two) times daily. 180 tablet 1  . valsartan-hydrochlorothiazide (DIOVAN-HCT) 320-25 MG tablet Take 1 tablet by mouth daily. 90 tablet 1   No current facility-administered medications on file prior to visit.    ROS Review of Systems  Constitutional:  Negative for fever, chills, diaphoresis and unexpected weight change.  HENT: Negative for congestion, hearing loss, rhinorrhea and sore throat.   Eyes: Negative for visual disturbance.  Respiratory: Negative for cough and shortness of breath.   Cardiovascular: Negative for chest pain.  Gastrointestinal: Negative for abdominal  pain, diarrhea and constipation.  Genitourinary: Negative for dysuria and flank pain.  Musculoskeletal: Positive for myalgias and arthralgias. Negative for joint swelling.  Skin: Negative for rash.  Neurological: Negative for dizziness and headaches.  Psychiatric/Behavioral: Negative for sleep disturbance and dysphoric mood.    Objective:  BP 127/71 mmHg  Pulse 76  Temp(Src) 97 F (36.1 C) (Oral)  Ht 6' (1.829 m)  Wt 291 lb (131.997 kg)  BMI 39.46 kg/m2  SpO2 94%  BP Readings from Last 3 Encounters:  06/17/15 127/71  02/14/15 112/60  11/27/14 125/75    Wt Readings from Last 3 Encounters:  06/17/15 291 lb (131.997 kg)  02/14/15 293 lb 6.4 oz (133.085 kg)  11/27/14 290 lb (131.543 kg)     Physical Exam  Constitutional: He is oriented to person, place, and time. He appears well-developed and well-nourished. No distress.  HENT:  Head: Normocephalic and atraumatic.  Right Ear: External ear normal.  Left Ear: External ear normal.  Nose: Nose normal.  Mouth/Throat: Oropharynx is clear and moist.  Eyes: Conjunctivae and EOM are normal. Pupils are equal, round, and reactive to light.  Neck: Normal range of motion. Neck supple. No thyromegaly present.  Cardiovascular: Normal rate, regular rhythm and normal heart sounds.   No murmur heard. Pulmonary/Chest: Effort normal and breath sounds normal. No respiratory distress. He has no wheezes. He has no rales.  Abdominal: Soft. Bowel sounds are normal. He exhibits no distension. There is no tenderness.  Musculoskeletal: He exhibits tenderness. He exhibits no edema.  Patient states that he has to use the right arm and shoulder a lot in his work. 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 This is with the shoulder outstretched. The pain involves the shoulder at the  glenohumeral to before meals region including the deltoid laterally. He also feels pain in the arm at the region of the bicep belly connecting to its tendon about the elbow. Range of motion for abduction is 60. Rotation is 45 for the shoulder. Elbow flexion and extension have full range of motion. There is tenderness at the distal biceps muscle belly. There is some limitation for supination had about 90-135.  Lymphadenopathy:    He has no cervical adenopathy.  Neurological: He is alert and oriented to person, place, and time. He has normal reflexes.  Patient reports that he will have times where his rest muscles will twitch uncontrollably. This seems to be more at night but can happen at any time. It has been known to last up to several hours but usually just for a few minutes. This is been ongoing for several months. He's also noted some facial twitches as well.  Skin: Skin is warm and dry.  Psychiatric: He has a normal mood and affect. His behavior is normal. Judgment and thought content normal.    Lab Results  Component Value Date   HGBA1C 6.3 02/14/2015   HGBA1C 6.5 10/15/2014   HGBA1C 6.4 01/01/2014    Lab Results  Component Value Date   WBC  9.8 02/14/2015   HGB 17.8* 01/01/2014   HCT 48.6 02/14/2015   PLT 205 02/14/2015   GLUCOSE 150* 06/12/2015   CHOL 119 06/12/2015   TRIG 159* 06/12/2015   HDL 44 06/12/2015   LDLCALC 43 06/12/2015   ALT 23 06/12/2015   AST 16 06/12/2015   NA 141 06/12/2015   K 4.5 06/12/2015   CL 99 06/12/2015   CREATININE 1.17 06/12/2015   BUN 23 06/12/2015   CO2 23 06/12/2015   TSH 1.430 08/06/2013   PSA 0.5 08/06/2013   HGBA1C 6.3 02/14/2015   HbA1c=6.6 on 06/12/2015   Assessment & Plan:   Eugene Bautista was seen today for diabetes, hypertension and hyperlipidemia.  Diagnoses and all orders for this visit:  Pain in joint of right shoulder -     DG Shoulder Right; Future -     DG Humerus Right; Future -     Ambulatory referral to Physical  Therapy  Type 2 diabetes mellitus with diabetic nephropathy, without long-term current use of insulin (HCC)  Essential hypertension  Hyperlipemia  Muscle twitching  Hypogonadism, testicular -     Testosterone,Free and Total  Other orders -     Discontinue: testosterone (ANDRODERM) 4 MG/24HR PT24 patch; Place 1 patch onto the skin daily. -     testosterone (ANDRODERM) 4 MG/24HR PT24 patch; Place 1 patch onto the skin daily. -     predniSONE (DELTASONE) 10 MG tablet; Take 5 daily for 2 days followed by 4,3,2 and 1 for 2 days each. -     cyclobenzaprine (FLEXERIL) 10 MG tablet; Take 1 tablet (10 mg total) by mouth 3 (three) times daily as needed for muscle spasms.   I am having Eugene Bautista start on predniSONE and cyclobenzaprine. I am also having him maintain his aspirin, fish oil-omega-3 fatty acids, cholecalciferol, amLODipine, canagliflozin, glipiZIDE, sitaGLIPtin-metformin, metoprolol succinate, rosuvastatin, valsartan-hydrochlorothiazide, and testosterone.  Meds ordered this encounter  Medications  . DISCONTD: testosterone (ANDRODERM) 4 MG/24HR PT24 patch    Sig: Place 1 patch onto the skin daily.    Dispense:  30 patch    Refill:  5  . testosterone (ANDRODERM) 4 MG/24HR PT24 patch    Sig: Place 1 patch onto the skin daily.    Dispense:  90 patch    Refill:  1  . predniSONE (DELTASONE) 10 MG tablet    Sig: Take 5 daily for 2 days followed by 4,3,2 and 1 for 2 days each.    Dispense:  30 tablet    Refill:  0  . cyclobenzaprine (FLEXERIL) 10 MG tablet    Sig: Take 1 tablet (10 mg total) by mouth 3 (three) times daily as needed for muscle spasms.    Dispense:  90 tablet    Refill:  1  Patient advised to consume more water. Discontinue drinking soda. Resumed use of testosterone this time in the form of AndroGel each of these should help with the twitching etc.Regular exercise reviewed. We also discussed pros and cons of testosterone suplementation and possible relief of muscle  twitch plus abd circumference, muscle tone and bone health.   We discussed DM prognosis related to excelllent A1c vs continued weight gain with lack of exercise and diet. Pt counselled to begin exercise program starting with P.T. For shoulder plus walking. He is also to DC sugared drinks. We discussed limiting calories and conting carbs.  Encouraged pt. To monitor glucose fasting and 2 hr post prandial daily. Bring reaadings to office.  Follow-up: Return in about  6 weeks (around 07/29/2015).  Mechele ClaudeWarren Lakyn Mantione, M.D.

## 2015-06-19 DIAGNOSIS — I10 Essential (primary) hypertension: Secondary | ICD-10-CM | POA: Diagnosis not present

## 2015-06-19 DIAGNOSIS — E669 Obesity, unspecified: Secondary | ICD-10-CM | POA: Diagnosis not present

## 2015-06-19 DIAGNOSIS — E119 Type 2 diabetes mellitus without complications: Secondary | ICD-10-CM | POA: Diagnosis not present

## 2015-06-19 DIAGNOSIS — E785 Hyperlipidemia, unspecified: Secondary | ICD-10-CM | POA: Diagnosis not present

## 2015-06-23 ENCOUNTER — Ambulatory Visit: Payer: BLUE CROSS/BLUE SHIELD | Attending: Family Medicine | Admitting: Physical Therapy

## 2015-06-23 DIAGNOSIS — M25511 Pain in right shoulder: Secondary | ICD-10-CM | POA: Diagnosis not present

## 2015-06-23 DIAGNOSIS — M25611 Stiffness of right shoulder, not elsewhere classified: Secondary | ICD-10-CM | POA: Insufficient documentation
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
## 2015-06-25 ENCOUNTER — Telehealth: Payer: Self-pay

## 2015-06-25 NOTE — Telephone Encounter (Signed)
Pt. Did try androgel. It was ineffective, (testosterone didn't come up, sense of well being, strength and energy were not improved.) messy and Caused skin sensitivity rash

## 2015-06-25 NOTE — Telephone Encounter (Signed)
Insurance denied Androderm  Needs to try and fail Androgel 1.62%

## 2015-06-28 DIAGNOSIS — E119 Type 2 diabetes mellitus without complications: Secondary | ICD-10-CM | POA: Diagnosis not present

## 2015-06-28 DIAGNOSIS — H40033 Anatomical narrow angle, bilateral: Secondary | ICD-10-CM | POA: Diagnosis not present

## 2015-06-28 LAB — HM DIABETES EYE EXAM

## 2015-06-30 ENCOUNTER — Ambulatory Visit: Payer: BLUE CROSS/BLUE SHIELD | Admitting: Physical Therapy

## 2015-06-30 DIAGNOSIS — M25511 Pain in right shoulder: Secondary | ICD-10-CM

## 2015-06-30 DIAGNOSIS — M25611 Stiffness of right shoulder, not elsewhere classified: Secondary | ICD-10-CM | POA: Diagnosis not present
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
## 2015-07-03 ENCOUNTER — Telehealth: Payer: Self-pay

## 2015-07-03 NOTE — Telephone Encounter (Signed)
Insurance prior authorized Androderm through 06/30/16

## 2015-07-07 ENCOUNTER — Encounter: Payer: Self-pay | Admitting: Physical Therapy

## 2015-07-07 ENCOUNTER — Ambulatory Visit: Payer: BLUE CROSS/BLUE SHIELD | Attending: Family Medicine | Admitting: Physical Therapy

## 2015-07-07 DIAGNOSIS — M25611 Stiffness of right shoulder, not elsewhere classified: Secondary | ICD-10-CM | POA: Diagnosis not present

## 2015-07-07 DIAGNOSIS — M25511 Pain in right shoulder: Secondary | ICD-10-CM | POA: Insufficient documentation

## 2015-07-07 NOTE — Patient Instructions (Signed)
ROM: External / Internal Rotation - Wand   Holding wand with left hand palm up, push out from body with other hand, palm down. Keep both elbows bent. When stretch is felt, hold _5___ seconds. Repeat to other side, leading with same hand. Keep elbows bent. Repeat __10__ times per set. Do __2-3__ sets per session. Do __2__ sessions per day.  http://orth.exer.us/748   Copyright  VHI. All rights reserved.  ROM: Flexion - Wand (Supine)   Lie on back holding wand. Raise arms over head.  Repeat __10__ times per set. Do _2-3___ sets per session. Do _2___ sessions per day.  http://orth.exer.us/928   Copyright  VHI. All rights reserved.   
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
## 2015-07-10 ENCOUNTER — Ambulatory Visit: Payer: BLUE CROSS/BLUE SHIELD | Admitting: Physical Therapy

## 2015-07-10 ENCOUNTER — Encounter: Payer: Self-pay | Admitting: Physical Therapy

## 2015-07-10 ENCOUNTER — Telehealth: Payer: Self-pay | Admitting: Family Medicine

## 2015-07-10 DIAGNOSIS — M25511 Pain in right shoulder: Secondary | ICD-10-CM

## 2015-07-10 DIAGNOSIS — M25611 Stiffness of right shoulder, not elsewhere classified: Secondary | ICD-10-CM | POA: Diagnosis not present
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
## 2015-07-10 NOTE — Patient Instructions (Signed)
Rowing: Resisted (Sitting)    Long-sit with resistive band around feet, hands firmly holding ends. Pull elbows back. Repeat __10__ times per set. Do _2___ sets per session. Do __2-3__ sessions per day.  http://orth.exer.us/184   Copyright  VHI. All rights reserved.  Strengthening: Chest Pull - Resisted    With resistive band looped around each hand, and arms straight out in front, stretch band across chest. Repeat _10___ times per set. Do __2__ sets per session. Do _2-3___ sessions per day.  http://orth.exer.us/926   Copyright  VHI. All rights reserved.  Strengthening: Resisted Extension    Hold tubing in right hand, arm forward. Pull arm back, elbow straight. Repeat _10___ times per set. Do __2__ sets per session. Do _2-3___ sessions per day.  http://orth.exer.us/832   Copyright  VHI. All rights reserved.

## 2015-07-11 ENCOUNTER — Ambulatory Visit: Payer: BLUE CROSS/BLUE SHIELD | Admitting: Family Medicine

## 2015-07-11 NOTE — Telephone Encounter (Signed)
Please  write and I will sign. Thanks, WS 

## 2015-07-21 DIAGNOSIS — E291 Testicular hypofunction: Secondary | ICD-10-CM | POA: Diagnosis not present

## 2015-08-07 DIAGNOSIS — E291 Testicular hypofunction: Secondary | ICD-10-CM | POA: Diagnosis not present

## 2015-08-13 ENCOUNTER — Ambulatory Visit (INDEPENDENT_AMBULATORY_CARE_PROVIDER_SITE_OTHER): Payer: BLUE CROSS/BLUE SHIELD | Admitting: Neurology

## 2015-08-13 ENCOUNTER — Encounter: Payer: Self-pay | Admitting: Neurology

## 2015-08-13 VITALS — BP 120/70 | HR 85 | Ht 72.0 in | Wt 288.5 lb

## 2015-08-13 DIAGNOSIS — M62838 Other muscle spasm: Secondary | ICD-10-CM

## 2015-08-13 DIAGNOSIS — R253 Fasciculation: Secondary | ICD-10-CM | POA: Diagnosis not present

## 2015-08-13 DIAGNOSIS — IMO0001 Reserved for inherently not codable concepts without codable children: Secondary | ICD-10-CM

## 2015-08-13 DIAGNOSIS — R6889 Other general symptoms and signs: Secondary | ICD-10-CM
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
## 2015-08-13 NOTE — Patient Instructions (Addendum)
1.  Check vitamin B12, CK, aldolase, copper 2.  NCS/EMG of the left arm and leg with attention to paraspinal muscle 3.  Routine EEG to be performed when patient develops symptoms, if there is availability in the schedule 4.  Talk to you primary care provider about possible sleep apnea   Return to clinic in 3 months

## 2015-08-18 ENCOUNTER — Encounter: Payer: Self-pay | Admitting: *Deleted

## 2015-08-18 ENCOUNTER — Other Ambulatory Visit: Payer: Self-pay | Admitting: *Deleted

## 2015-08-18 ENCOUNTER — Ambulatory Visit (INDEPENDENT_AMBULATORY_CARE_PROVIDER_SITE_OTHER): Payer: BLUE CROSS/BLUE SHIELD | Admitting: Neurology

## 2015-08-18 ENCOUNTER — Telehealth: Payer: Self-pay | Admitting: Neurology

## 2015-08-18 ENCOUNTER — Telehealth: Payer: Self-pay | Admitting: Family Medicine

## 2015-08-18 DIAGNOSIS — R6889 Other general symptoms and signs: Secondary | ICD-10-CM

## 2015-08-18 DIAGNOSIS — IMO0001 Reserved for inherently not codable concepts without codable children: Secondary | ICD-10-CM

## 2015-08-18 DIAGNOSIS — R259 Unspecified abnormal involuntary movements: Secondary | ICD-10-CM

## 2015-08-18 DIAGNOSIS — M62838 Other muscle spasm: Secondary | ICD-10-CM

## 2015-08-18 DIAGNOSIS — R253 Fasciculation: Secondary | ICD-10-CM

## 2015-08-18 NOTE — Telephone Encounter (Signed)
EEG Negative for seizure activity. Testosterone was Low when checked recently. Recheck again in early July. WS

## 2015-08-18 NOTE — Telephone Encounter (Signed)
Patient coming in today at 1:00.  Darl PikesSusan is coming over to do the EEG.

## 2015-08-18 NOTE — Telephone Encounter (Signed)
Great, thanks

## 2015-08-18 NOTE — Telephone Encounter (Signed)
Eugene Bautista 03/12/2055. His wife called in saying that he was having another episode. He was having spasm that start at his abdomen and works its way up thru his chest and then he shakes. Dr. Allena KatzPatel had told him if it happened again to call in and get and EEG scheduled. He is scheduled in August for an EMG. His wife's name is Alona BeneJoyce [phone]. Thank you

## 2015-08-18 NOTE — Procedures (Signed)
TECHNICAL SUMMARY:  A multichannel referential and bipolar montage EEG using the standard international 10-20 system was performed on the patient described as awake and briefly drowsy.  The dominant background activity consists of 9 hertz activity seen most prominantly over the posterior head region.  The backgound activity is reactive to eye opening and closing procedures.  Low voltage fast (beta) activity is distributed symmetrically and maximally over the anterior head regions.  ACTIVATION:  Stepwise photic stimulation at 4-20 flashes per second was performed and did not elicit any abnormal waveforms.  Hyperventilation was performed for 3 minutes with fair patient effort and produced no changes in the background activity.  EPILEPTIFORM ACTIVITY:  There were no spikes, sharp waves or paroxysmal activity.  The patient did have multiple episodes of his typical "head movement" throughout the tracing and this was not associated with epileptiform activity, and only myogenic artifact was noted.  SLEEP:  Brief physiologic drowsiness, but no stage II sleep.  CARDIAC:  The EKG lead revealed a regular sinus rhythm.  IMPRESSION:  This is a normal EEG for the patients stated age.  There were no focal, hemispheric or lateralizing features.  No epileptiform activity was recorded.  A normal EEG does not exclude the diagnosis of a seizure disorder and if seizure remains high on the list of differential diagnosis, an ambulatory EEG may be of value. The patient did have multiple episodes of his typical "head movement" throughout the tracing and this was not associated with epileptiform activity, and only myogenic artifact was noted. Clinical correlation is required.

## 2015-08-18 NOTE — Telephone Encounter (Signed)
Patient aware that amlodipine was started 12/2012

## 2015-08-20 ENCOUNTER — Other Ambulatory Visit: Payer: Self-pay | Admitting: *Deleted

## 2015-08-20 DIAGNOSIS — IMO0001 Reserved for inherently not codable concepts without codable children: Secondary | ICD-10-CM

## 2015-08-20 DIAGNOSIS — R259 Unspecified abnormal involuntary movements: Secondary | ICD-10-CM

## 2015-08-20 DIAGNOSIS — R6889 Other general symptoms and signs: Secondary | ICD-10-CM

## 2015-08-20 DIAGNOSIS — R253 Fasciculation: Secondary | ICD-10-CM

## 2015-08-20 DIAGNOSIS — M62838 Other muscle spasm: Secondary | ICD-10-CM

## 2015-08-27 ENCOUNTER — Ambulatory Visit (HOSPITAL_COMMUNITY): Payer: BLUE CROSS/BLUE SHIELD

## 2015-08-29 ENCOUNTER — Other Ambulatory Visit: Payer: Self-pay | Admitting: Family Medicine

## 2015-09-02 ENCOUNTER — Encounter: Payer: BLUE CROSS/BLUE SHIELD | Admitting: Neurology

## 2015-09-04 ENCOUNTER — Ambulatory Visit
Admission: RE | Admit: 2015-09-04 | Discharge: 2015-09-04 | Disposition: A | Discharge status: Home / Self Care | Payer: BLUE CROSS/BLUE SHIELD | Source: Ambulatory Visit | Attending: Neurology | Admitting: Neurology

## 2015-09-04 ENCOUNTER — Encounter: Payer: Self-pay | Admitting: *Deleted

## 2015-09-04 DIAGNOSIS — R258 Other abnormal involuntary movements: Secondary | ICD-10-CM | POA: Diagnosis not present

## 2015-09-04 DIAGNOSIS — M62838 Other muscle spasm: Secondary | ICD-10-CM

## 2015-09-04 DIAGNOSIS — R6889 Other general symptoms and signs: Secondary | ICD-10-CM

## 2015-09-04 DIAGNOSIS — R259 Unspecified abnormal involuntary movements: Secondary | ICD-10-CM

## 2015-09-04 DIAGNOSIS — IMO0001 Reserved for inherently not codable concepts without codable children: Secondary | ICD-10-CM

## 2015-09-04 DIAGNOSIS — R253 Fasciculation: Secondary | ICD-10-CM

## 2015-09-04 MED ORDER — GADOBENATE DIMEGLUMINE 529 MG/ML IV SOLN
20.0000 mL | Freq: Once | INTRAVENOUS | Status: AC | PRN
  Administered 2015-09-04: 20 mL via INTRAVENOUS

## 2015-09-08 ENCOUNTER — Other Ambulatory Visit: Payer: Self-pay | Admitting: Family Medicine

## 2015-09-18 DIAGNOSIS — E669 Obesity, unspecified: Secondary | ICD-10-CM | POA: Diagnosis not present

## 2015-09-18 DIAGNOSIS — I10 Essential (primary) hypertension: Secondary | ICD-10-CM | POA: Diagnosis not present

## 2015-09-18 DIAGNOSIS — E119 Type 2 diabetes mellitus without complications: Secondary | ICD-10-CM | POA: Diagnosis not present

## 2015-09-18 DIAGNOSIS — E785 Hyperlipidemia, unspecified: Secondary | ICD-10-CM | POA: Diagnosis not present

## 2015-09-23 ENCOUNTER — Ambulatory Visit: Payer: BLUE CROSS/BLUE SHIELD | Admitting: Family Medicine

## 2015-10-08 ENCOUNTER — Ambulatory Visit (INDEPENDENT_AMBULATORY_CARE_PROVIDER_SITE_OTHER): Payer: BLUE CROSS/BLUE SHIELD | Admitting: Family Medicine

## 2015-10-08 ENCOUNTER — Encounter: Payer: Self-pay | Admitting: Family Medicine

## 2015-10-08 VITALS — BP 122/73 | HR 80 | Temp 97.5°F | Ht 72.0 in | Wt 298.4 lb

## 2015-10-08 DIAGNOSIS — I10 Essential (primary) hypertension: Secondary | ICD-10-CM

## 2015-10-08 DIAGNOSIS — E291 Testicular hypofunction: Secondary | ICD-10-CM | POA: Diagnosis not present

## 2015-10-08 DIAGNOSIS — E785 Hyperlipidemia, unspecified: Secondary | ICD-10-CM

## 2015-10-08 DIAGNOSIS — E1169 Type 2 diabetes mellitus with other specified complication: Secondary | ICD-10-CM

## 2015-10-08 DIAGNOSIS — E1121 Type 2 diabetes mellitus with diabetic nephropathy: Secondary | ICD-10-CM | POA: Diagnosis not present

## 2015-10-08 LAB — BAYER DCA HB A1C WAIVED: HB A1C: 6.6 % (ref <7.0)

## 2015-10-08 NOTE — Progress Notes (Signed)
Subjective:  Patient ID: Eugene Bautista, male    DOB: 02-04-1956  Age: 60 y.o. MRN: [id number]  CC: Hypertension (3 mth rck); Diabetes; and Testosterone deficiency   HPI [name] presents forFollow-up of diabetes. Patient checks blood sugar at home.   not checking fasting since he works nights and 200 postprandial Patient denies symptoms such as polyuria, polydipsia, excessive hunger, nausea No significant hypoglycemic spells noted. Medications as noted below. Taking them regularly without complication/adverse reaction being reported today. Checking feet daily. Last eye appt was April 2017 nml. Started testosterone about 40 days ago. No side effects, but hasn't noticed improvement either  History [name] has a past medical history of Carotid artery occlusion; Diabetes mellitus without complication ([place]); Hyperlipidemia; Hypertension; Left carotid bruit; and Obesity.   He has a past surgical history that includes Ruptured Aorta Repair (1989); Urinary surgery (1989); and Fracture surgery (Left, 1989).   His family history includes Aneurysm (age of onset: 58) in his sister; Cancer in his brother; Dementia in his mother; Diabetes in his father; Healthy in his daughter; Heart attack (age of onset: 57) in his father; Heart disease in his father; Kidney disease in his father.He reports that he quit smoking about 14 years ago. His smoking use included Cigarettes. He has a 43.75 pack-year smoking history. He has never used smokeless tobacco. He reports that he drinks about 3.6 oz of alcohol per week . He reports that he does not use drugs.  Current Outpatient Prescriptions on File Prior to Visit  Medication Sig Dispense Refill  . amLODipine (NORVASC) 10 MG tablet Take 1 tablet by mouth  daily 90 tablet 0  . aspirin 81 MG tablet Take 81 mg by mouth daily.    . canagliflozin (INVOKANA) 300 MG TABS tablet Take 1 tablet (300 mg total) by mouth daily before breakfast. (Patient taking  differently: Take 150 mg by mouth daily before breakfast. ) 90 tablet 1  . cholecalciferol (VITAMIN D) 1000 UNITS tablet Take 2,000 Units by mouth daily.    . fish oil-omega-3 fatty acids 1000 MG capsule Take 1 g by mouth 2 (two) times daily.    Marland Kitchen glipiZIDE (GLUCOTROL) 5 MG tablet Take 1 tablet by mouth  every morning 90 tablet 0  . JANUMET 50-1000 MG tablet Take 1 tablet by mouth  twice a day 180 tablet 0  . metoprolol succinate (TOPROL-XL) 25 MG 24 hr tablet Take 1 tablet by mouth  daily 90 tablet 0  . rosuvastatin (CRESTOR) 10 MG tablet Take 1 tablet by mouth at  bedtime 90 tablet 0  . testosterone (ANDRODERM) 4 MG/24HR PT24 patch Place 1 patch onto the skin daily. 90 patch 1  . valsartan-hydrochlorothiazide (DIOVAN-HCT) 320-25 MG tablet Take 1 tablet by mouth  daily 90 tablet 0   No current facility-administered medications on file prior to visit.     ROS Review of Systems  Constitutional: Negative for chills, diaphoresis, fever and unexpected weight change.  HENT: Negative for congestion, hearing loss, rhinorrhea and sore throat.   Eyes: Negative for visual disturbance.  Respiratory: Negative for cough and shortness of breath.   Cardiovascular: Negative for chest pain.  Gastrointestinal: Negative for abdominal pain, constipation and diarrhea.  Genitourinary: Negative for dysuria and flank pain.  Musculoskeletal: Negative for arthralgias and joint swelling.  Skin: Negative for rash.  Neurological: Negative for dizziness and headaches.  Psychiatric/Behavioral: Negative for dysphoric mood and sleep disturbance.    Objective:  BP 122/73 (BP Location: Left Arm, Patient  Position: Sitting, Cuff Size: Large)   Pulse 80   Temp 97.5 F (36.4 C) (Oral)   Ht 6' (1.829 m)   Wt 298 lb 6.4 oz (135.4 kg)   SpO2 97%   BMI 40.47 kg/m   BP Readings from Last 3 Encounters:  10/08/15 122/73  08/13/15 120/70  06/17/15 127/71    Wt Readings from Last 3 Encounters:  10/08/15 298 lb 6.4 oz  (135.4 kg)  08/13/15 288 lb 8 oz (130.9 kg)  06/17/15 291 lb (132 kg)     Physical Exam  Constitutional: He is oriented to person, place, and time. He appears well-developed and well-nourished. No distress.  HENT:  Head: Normocephalic and atraumatic.  Right Ear: External ear normal.  Left Ear: External ear normal.  Nose: Nose normal.  Mouth/Throat: Oropharynx is clear and moist.  Eyes: Conjunctivae and EOM are normal. Pupils are equal, round, and reactive to light.  Neck: Normal range of motion. Neck supple. No thyromegaly present.  Cardiovascular: Normal rate, regular rhythm and normal heart sounds.   No murmur heard. Pulmonary/Chest: Effort normal and breath sounds normal. No respiratory distress. He has no wheezes. He has no rales.  Abdominal: Soft. Bowel sounds are normal. He exhibits no distension. There is no tenderness.  Lymphadenopathy:    He has no cervical adenopathy.  Neurological: He is alert and oriented to person, place, and time. He has normal reflexes.  Skin: Skin is warm and dry.  Psychiatric: He has a normal mood and affect. His behavior is normal. Judgment and thought content normal.    Lab Results  Component Value Date   HGBA1C 6.3 02/14/2015   HGBA1C 6.5 10/15/2014   HGBA1C 6.4 01/01/2014    Lab Results  Component Value Date   WBC 9.8 02/14/2015   HGB 17.8 (H) 01/01/2014   HCT 48.6 02/14/2015   PLT 205 02/14/2015   GLUCOSE 131 (H) 10/08/2015   CHOL 105 10/08/2015   TRIG 139 10/08/2015   HDL 39 (L) 10/08/2015   LDLCALC 38 10/08/2015   ALT 32 10/08/2015   AST 22 10/08/2015   NA 137 10/08/2015   K 4.8 10/08/2015   CL 96 10/08/2015   CREATININE 1.33 (H) 10/08/2015   BUN 25 (H) 10/08/2015   CO2 25 10/08/2015   TSH 1.430 08/06/2013   PSA 0.5 08/06/2013   HGBA1C 6.3 02/14/2015     Assessment & Plan:   Strider was seen today for hypertension, diabetes and testosterone deficiency.  Diagnoses and all orders for this visit:  Hypogonadism,  testicular -     Testosterone,Free and Total; Future  Dyslipidemia associated with type 2 diabetes mellitus ([place]) -     Lipid panel  Essential hypertension -     CMP14+EGFR  Type 2 diabetes mellitus with diabetic nephropathy, without long-term current use of insulin (HCC) -     Cancel: Bayer DCA Hb A1c Waived -     CMP14+EGFR -     Bayer DCA Hb A1c Waived    Results for orders placed or performed in visit on 10/08/15  CMP14+EGFR  Result Value Ref Range   Glucose 131 (H) 65 - 99 mg/dL   BUN 25 (H) 6 - 24 mg/dL   Creatinine, Ser 1.33 (H) 0.76 - 1.27 mg/dL   GFR calc non Af Amer 59 (L) >59 mL/min/1.73   GFR calc Af Amer 68 >59 mL/min/1.73   BUN/Creatinine Ratio 19 9 - 20   Sodium 137 134 - 144 mmol/L   Potassium  4.8 3.5 - 5.2 mmol/L   Chloride 96 96 - 106 mmol/L   CO2 25 18 - 29 mmol/L   Calcium 9.5 8.7 - 10.2 mg/dL   Total Protein 7.4 6.0 - 8.5 g/dL   Albumin 4.5 3.5 - 5.5 g/dL   Globulin, Total 2.9 1.5 - 4.5 g/dL   Albumin/Globulin Ratio 1.6 1.2 - 2.2   Bilirubin Total 0.5 0.0 - 1.2 mg/dL   Alkaline Phosphatase 67 39 - 117 IU/L   AST 22 0 - 40 IU/L   ALT 32 0 - 44 IU/L  Lipid panel  Result Value Ref Range   Cholesterol, Total 105 100 - 199 mg/dL   Triglycerides 139 0 - 149 mg/dL   HDL 39 (L) >39 mg/dL   VLDL Cholesterol Cal 28 5 - 40 mg/dL   LDL Calculated 38 0 - 99 mg/dL   Chol/HDL Ratio 2.7 0.0 - 5.0 ratio units  Bayer DCA Hb A1c Waived  Result Value Ref Range   Bayer DCA Hb A1c Waived 6.6 <7.0 %     I am having Mr. Meader maintain his aspirin, fish oil-omega-3 fatty acids, cholecalciferol, canagliflozin, testosterone, glipiZIDE, JANUMET, amLODipine, rosuvastatin, metoprolol succinate, and valsartan-hydrochlorothiazide.  No orders of the defined types were placed in this encounter.    Follow-up: Return in about 3 months (around 01/08/2016).  Claretta Fraise, M.D.

## 2015-10-09 LAB — CMP14+EGFR
A/G RATIO: 1.6 (ref 1.2–2.2)
ALT: 32 IU/L (ref 0–44)
AST: 22 IU/L (ref 0–40)
Albumin: 4.5 g/dL (ref 3.5–5.5)
Alkaline Phosphatase: 67 IU/L (ref 39–117)
BUN/Creatinine Ratio: 19 (ref 9–20)
BUN: 25 mg/dL — ABNORMAL HIGH (ref 6–24)
Bilirubin Total: 0.5 mg/dL (ref 0.0–1.2)
CALCIUM: 9.5 mg/dL (ref 8.7–10.2)
CO2: 25 mmol/L (ref 18–29)
CREATININE: 1.33 mg/dL — AB (ref 0.76–1.27)
Chloride: 96 mmol/L (ref 96–106)
GFR calc Af Amer: 68 mL/min/{1.73_m2} (ref >59)
GFR, EST NON AFRICAN AMERICAN: 59 mL/min/{1.73_m2} — AB (ref >59)
Globulin, Total: 2.9 g/dL (ref 1.5–4.5)
Glucose: 131 mg/dL — ABNORMAL HIGH (ref 65–99)
POTASSIUM: 4.8 mmol/L (ref 3.5–5.2)
Sodium: 137 mmol/L (ref 134–144)
Total Protein: 7.4 g/dL (ref 6.0–8.5)

## 2015-10-09 LAB — LIPID PANEL
CHOL/HDL RATIO: 2.7 ratio (ref 0.0–5.0)
CHOLESTEROL TOTAL: 105 mg/dL (ref 100–199)
HDL: 39 mg/dL — AB (ref >39)
LDL Calculated: 38 mg/dL (ref 0–99)
TRIGLYCERIDES: 139 mg/dL (ref 0–149)
VLDL Cholesterol Cal: 28 mg/dL (ref 5–40)

## 2015-10-14 ENCOUNTER — Encounter: Payer: BLUE CROSS/BLUE SHIELD | Admitting: Neurology

## 2015-10-22 ENCOUNTER — Other Ambulatory Visit: Payer: BLUE CROSS/BLUE SHIELD

## 2015-10-22 DIAGNOSIS — E291 Testicular hypofunction: Secondary | ICD-10-CM

## 2015-10-23 LAB — TESTOSTERONE,FREE AND TOTAL
TESTOSTERONE FREE: 14.5 pg/mL (ref 7.2–24.0)
TESTOSTERONE: 301 ng/dL (ref 264–916)

## 2015-11-03 ENCOUNTER — Other Ambulatory Visit: Payer: Self-pay | Admitting: Family Medicine

## 2015-11-03 NOTE — Telephone Encounter (Signed)
Patient will call back Friday if he has not received his refill from mail order by Friday 11/07/2015

## 2015-11-21 ENCOUNTER — Other Ambulatory Visit: Payer: Self-pay | Admitting: Family Medicine

## 2015-11-25 ENCOUNTER — Encounter: Payer: Self-pay | Admitting: Family

## 2015-12-02 ENCOUNTER — Ambulatory Visit (HOSPITAL_COMMUNITY)
Admission: RE | Admit: 2015-12-02 | Discharge: 2015-12-02 | Disposition: A | Discharge status: Home / Self Care | Payer: BLUE CROSS/BLUE SHIELD | Source: Ambulatory Visit | Attending: Family | Admitting: Family

## 2015-12-02 ENCOUNTER — Ambulatory Visit (INDEPENDENT_AMBULATORY_CARE_PROVIDER_SITE_OTHER): Payer: BLUE CROSS/BLUE SHIELD | Admitting: Family

## 2015-12-02 ENCOUNTER — Encounter: Payer: Self-pay | Admitting: Family

## 2015-12-02 VITALS — BP 113/73 | HR 68 | Temp 99.4°F | Resp 16 | Ht 72.0 in | Wt 295.0 lb

## 2015-12-02 DIAGNOSIS — I6523 Occlusion and stenosis of bilateral carotid arteries: Secondary | ICD-10-CM

## 2015-12-02 DIAGNOSIS — Z87891 Personal history of nicotine dependence: Secondary | ICD-10-CM | POA: Diagnosis not present

## 2015-12-02 NOTE — Patient Instructions (Signed)
Stroke Prevention Some medical conditions and behaviors are associated with an increased chance of having a stroke. You may prevent a stroke by making healthy choices and managing medical conditions. HOW CAN I REDUCE MY RISK OF HAVING A STROKE?   Stay physically active. Get at least 30 minutes of activity on most or all days.  Do not smoke. It may also be helpful to avoid exposure to secondhand smoke.  Limit alcohol use. Moderate alcohol use is considered to be:  No more than 2 drinks per day for men.  No more than 1 drink per day for nonpregnant women.  Eat healthy foods. This involves:  Eating 5 or more servings of fruits and vegetables a day.  Making dietary changes that address high blood pressure (hypertension), high cholesterol, diabetes, or obesity.  Manage your cholesterol levels.  Making food choices that are high in fiber and low in saturated fat, trans fat, and cholesterol may control cholesterol levels.  Take any prescribed medicines to control cholesterol as directed by your health care provider.  Manage your diabetes.  Controlling your carbohydrate and sugar intake is recommended to manage diabetes.  Take any prescribed medicines to control diabetes as directed by your health care provider.  Control your hypertension.  Making food choices that are low in salt (sodium), saturated fat, trans fat, and cholesterol is recommended to manage hypertension.  Ask your health care provider if you need treatment to lower your blood pressure. Take any prescribed medicines to control hypertension as directed by your health care provider.  If you are 18-39 years of age, have your blood pressure checked every 3-5 years. If you are 40 years of age or older, have your blood pressure checked every year.  Maintain a healthy weight.  Reducing calorie intake and making food choices that are low in sodium, saturated fat, trans fat, and cholesterol are recommended to manage  weight.  Stop drug abuse.  Avoid taking birth control pills.  Talk to your health care provider about the risks of taking birth control pills if you are over 35 years old, smoke, get migraines, or have ever had a blood clot.  Get evaluated for sleep disorders (sleep apnea).  Talk to your health care provider about getting a sleep evaluation if you snore a lot or have excessive sleepiness.  Take medicines only as directed by your health care provider.  For some people, aspirin or blood thinners (anticoagulants) are helpful in reducing the risk of forming abnormal blood clots that can lead to stroke. If you have the irregular heart rhythm of atrial fibrillation, you should be on a blood thinner unless there is a good reason you cannot take them.  Understand all your medicine instructions.  Make sure that other conditions (such as anemia or atherosclerosis) are addressed. SEEK IMMEDIATE MEDICAL CARE IF:   You have sudden weakness or numbness of the face, arm, or leg, especially on one side of the body.  Your face or eyelid droops to one side.  You have sudden confusion.  You have trouble speaking (aphasia) or understanding.  You have sudden trouble seeing in one or both eyes.  You have sudden trouble walking.  You have dizziness.  You have a loss of balance or coordination.  You have a sudden, severe headache with no known cause.  You have new chest pain or an irregular heartbeat. Any of these symptoms may represent a serious problem that is an emergency. Do not wait to see if the symptoms will   go away. Get medical help at once. Call your local emergency services (911 in [place] Do not drive yourself to the hospital.   This information is not intended to replace advice given to you by your health care provider. Make sure you discuss any questions you have with your health care provider.   Document Released: 04/01/2004 Document Revised: 03/15/2014 Document Reviewed:  08/25/2012 Elsevier Interactive Patient Education 2016 [employer] Inc[employer]  
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
## 2015-12-02 NOTE — Progress Notes (Signed)
Tell pt. His carotid ultrasound is stable.

## 2015-12-11 DIAGNOSIS — E669 Obesity, unspecified: Secondary | ICD-10-CM | POA: Diagnosis not present

## 2015-12-11 DIAGNOSIS — I10 Essential (primary) hypertension: Secondary | ICD-10-CM | POA: Diagnosis not present

## 2015-12-11 DIAGNOSIS — E785 Hyperlipidemia, unspecified: Secondary | ICD-10-CM | POA: Diagnosis not present

## 2015-12-11 DIAGNOSIS — E119 Type 2 diabetes mellitus without complications: Secondary | ICD-10-CM | POA: Diagnosis not present

## 2015-12-20 ENCOUNTER — Other Ambulatory Visit: Payer: Self-pay | Admitting: Family Medicine

## 2015-12-24 DIAGNOSIS — E119 Type 2 diabetes mellitus without complications: Secondary | ICD-10-CM | POA: Diagnosis not present

## 2015-12-24 DIAGNOSIS — Z139 Encounter for screening, unspecified: Secondary | ICD-10-CM | POA: Diagnosis not present

## 2015-12-24 DIAGNOSIS — Z79899 Other long term (current) drug therapy: Secondary | ICD-10-CM | POA: Diagnosis not present

## 2015-12-24 DIAGNOSIS — E291 Testicular hypofunction: Secondary | ICD-10-CM | POA: Diagnosis not present

## 2015-12-25 ENCOUNTER — Other Ambulatory Visit: Payer: Self-pay | Admitting: Family Medicine

## 2016-01-15 DIAGNOSIS — E669 Obesity, unspecified: Secondary | ICD-10-CM | POA: Diagnosis not present

## 2016-01-15 DIAGNOSIS — Z6841 Body Mass Index (BMI) 40.0 and over, adult: Secondary | ICD-10-CM | POA: Diagnosis not present

## 2016-01-21 DIAGNOSIS — Z6841 Body Mass Index (BMI) 40.0 and over, adult: Secondary | ICD-10-CM | POA: Diagnosis not present

## 2016-01-21 DIAGNOSIS — E669 Obesity, unspecified: Secondary | ICD-10-CM | POA: Diagnosis not present

## 2016-02-04 DIAGNOSIS — Z6841 Body Mass Index (BMI) 40.0 and over, adult: Secondary | ICD-10-CM | POA: Diagnosis not present

## 2016-02-04 DIAGNOSIS — E669 Obesity, unspecified: Secondary | ICD-10-CM | POA: Diagnosis not present

## 2016-02-04 NOTE — Addendum Note (Signed)
Addended by: Burton ApleyPETTY, Carolynn Tuley A on: 02/04/2016 02:13 PM   Modules accepted: Orders

## 2016-02-09 DIAGNOSIS — Z6841 Body Mass Index (BMI) 40.0 and over, adult: Secondary | ICD-10-CM | POA: Diagnosis not present

## 2016-02-09 DIAGNOSIS — E669 Obesity, unspecified: Secondary | ICD-10-CM | POA: Diagnosis not present

## 2016-02-16 DIAGNOSIS — Z6841 Body Mass Index (BMI) 40.0 and over, adult: Secondary | ICD-10-CM | POA: Diagnosis not present

## 2016-02-16 DIAGNOSIS — E669 Obesity, unspecified: Secondary | ICD-10-CM | POA: Diagnosis not present

## 2016-03-01 ENCOUNTER — Other Ambulatory Visit: Payer: Self-pay | Admitting: Family Medicine

## 2016-03-02 ENCOUNTER — Other Ambulatory Visit: Payer: Self-pay | Admitting: Family Medicine

## 2016-03-04 DIAGNOSIS — L723 Sebaceous cyst: Secondary | ICD-10-CM | POA: Diagnosis not present

## 2016-03-04 DIAGNOSIS — D225 Melanocytic nevi of trunk: Secondary | ICD-10-CM | POA: Diagnosis not present

## 2016-03-22 DIAGNOSIS — E669 Obesity, unspecified: Secondary | ICD-10-CM | POA: Diagnosis not present

## 2016-03-22 DIAGNOSIS — Z6841 Body Mass Index (BMI) 40.0 and over, adult: Secondary | ICD-10-CM | POA: Diagnosis not present

## 2016-04-07 DIAGNOSIS — Z6841 Body Mass Index (BMI) 40.0 and over, adult: Secondary | ICD-10-CM | POA: Diagnosis not present

## 2016-04-07 DIAGNOSIS — E669 Obesity, unspecified: Secondary | ICD-10-CM | POA: Diagnosis not present

## 2016-05-03 ENCOUNTER — Other Ambulatory Visit: Payer: Self-pay | Admitting: Family Medicine

## 2016-05-03 ENCOUNTER — Telehealth: Payer: Self-pay | Admitting: Family Medicine

## 2016-05-03 DIAGNOSIS — E1169 Type 2 diabetes mellitus with other specified complication: Secondary | ICD-10-CM

## 2016-05-03 DIAGNOSIS — E291 Testicular hypofunction: Secondary | ICD-10-CM

## 2016-05-03 DIAGNOSIS — Z713 Dietary counseling and surveillance: Secondary | ICD-10-CM | POA: Diagnosis not present

## 2016-05-03 DIAGNOSIS — Z1389 Encounter for screening for other disorder: Secondary | ICD-10-CM | POA: Diagnosis not present

## 2016-05-03 DIAGNOSIS — E119 Type 2 diabetes mellitus without complications: Secondary | ICD-10-CM | POA: Diagnosis not present

## 2016-05-03 DIAGNOSIS — Z008 Encounter for other general examination: Secondary | ICD-10-CM | POA: Diagnosis not present

## 2016-05-03 DIAGNOSIS — I10 Essential (primary) hypertension: Secondary | ICD-10-CM | POA: Diagnosis not present

## 2016-05-03 DIAGNOSIS — E669 Obesity, unspecified: Secondary | ICD-10-CM | POA: Diagnosis not present

## 2016-05-03 DIAGNOSIS — E1121 Type 2 diabetes mellitus with diabetic nephropathy: Secondary | ICD-10-CM

## 2016-05-03 DIAGNOSIS — Z6841 Body Mass Index (BMI) 40.0 and over, adult: Secondary | ICD-10-CM | POA: Diagnosis not present

## 2016-05-03 DIAGNOSIS — E785 Hyperlipidemia, unspecified: Secondary | ICD-10-CM

## 2016-05-03 NOTE — Telephone Encounter (Signed)
Orders entered

## 2016-05-03 NOTE — Telephone Encounter (Signed)
He is wanting a lab order put in for him to get labs before his appointment with Dr. Darlyn ReadStacks on 03/018. Pt states it is a follow up appointment. Please advise.

## 2016-05-04 NOTE — Telephone Encounter (Signed)
Patient informed via voicemail that lab orders have been placed by Dr. Darlyn ReadStacks.

## 2016-05-07 IMAGING — CR DG SHOULDER 2+V*R*
3 series · 3 of 3 positions shown · non-contrast
Comparison: None in PACs

CLINICAL DATA: Right shoulder pain suspected due to over use

EXAM:
RIGHT SHOULDER - 2+ VIEW

[view not recorded (1 of 3)]
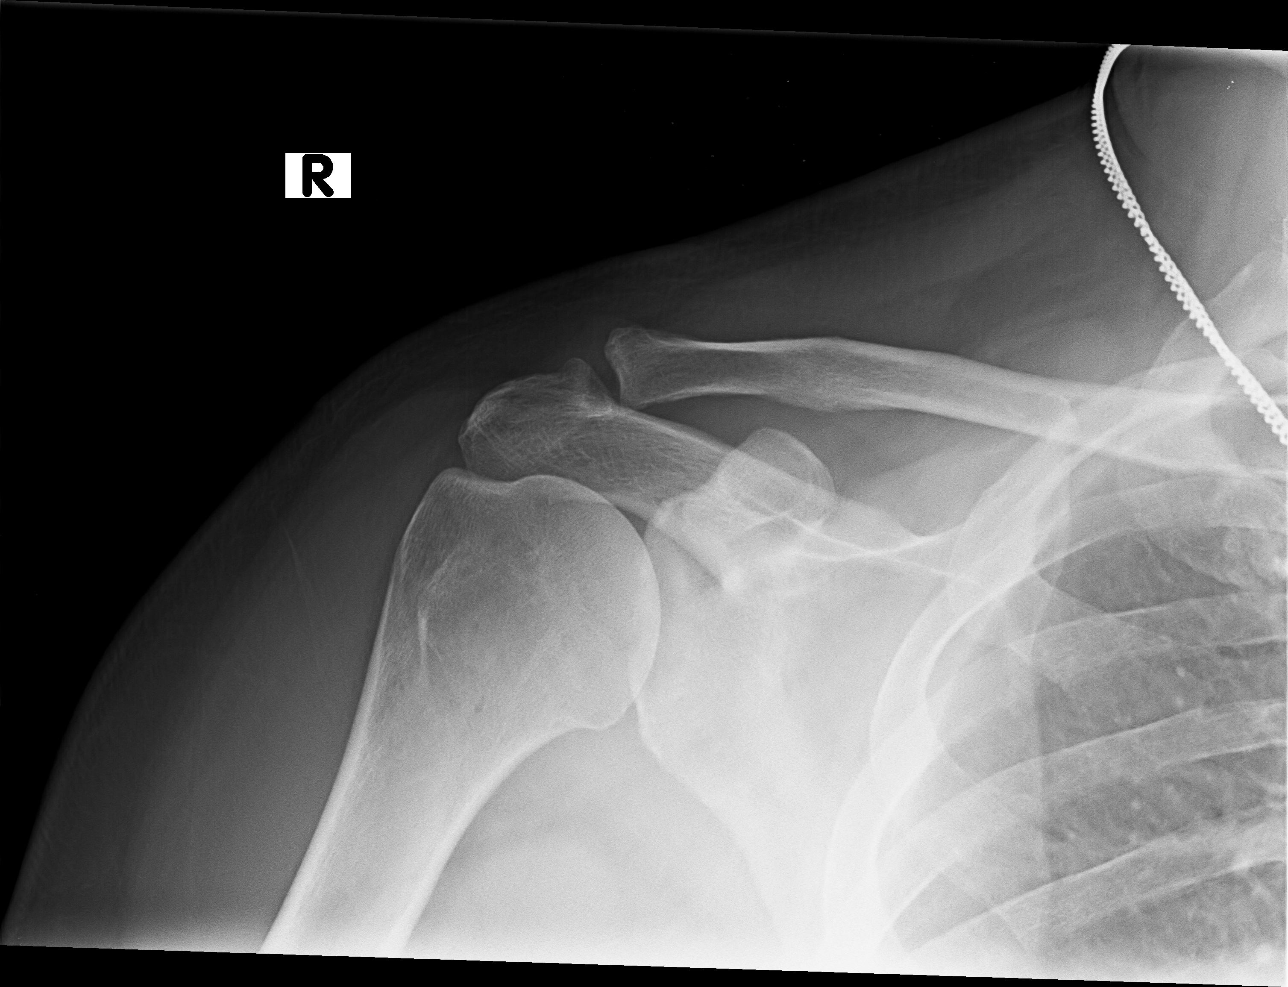

[view not recorded (2 of 3)]
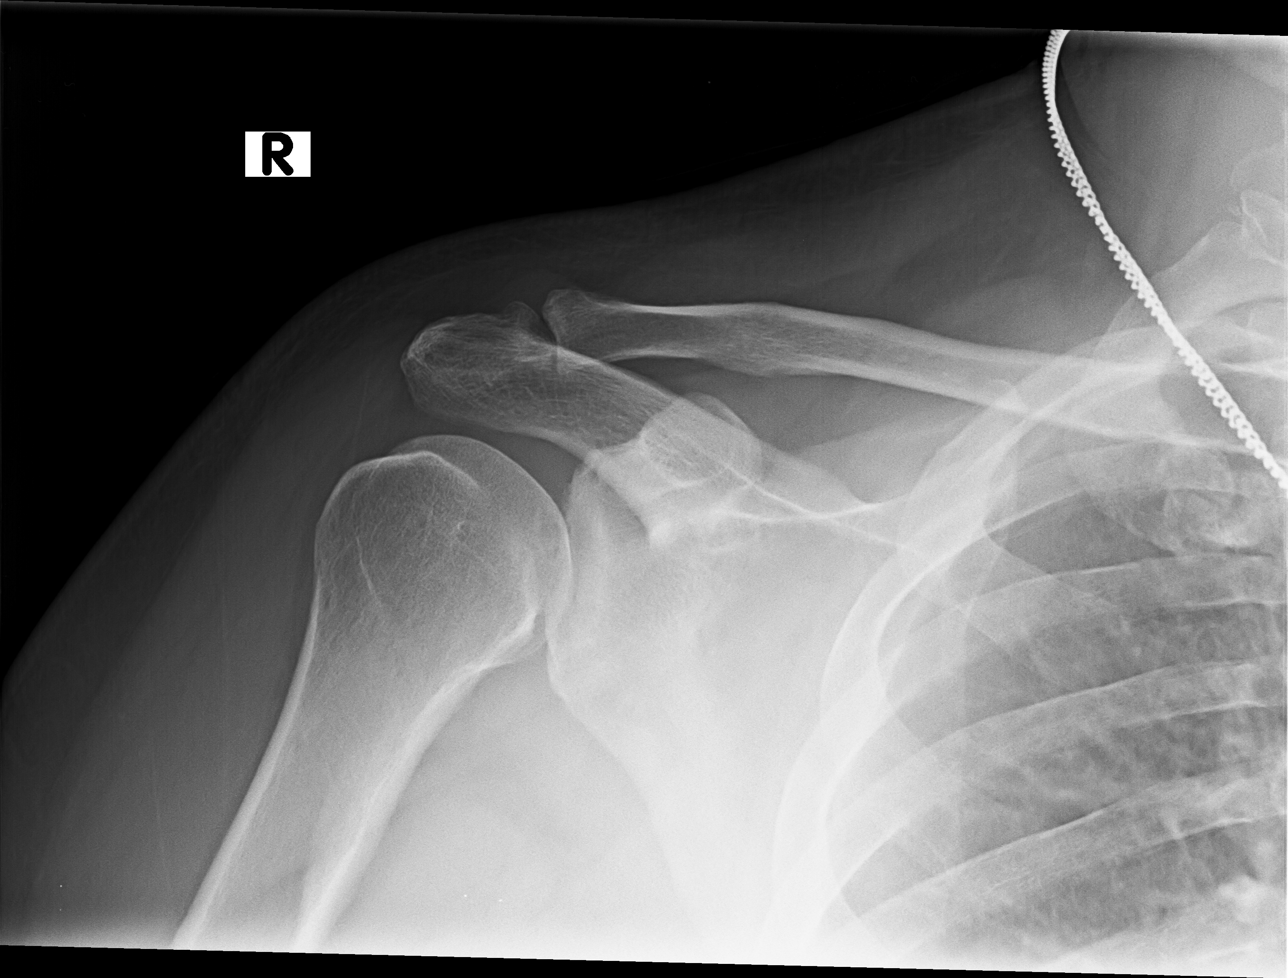

[view not recorded (3 of 3)]
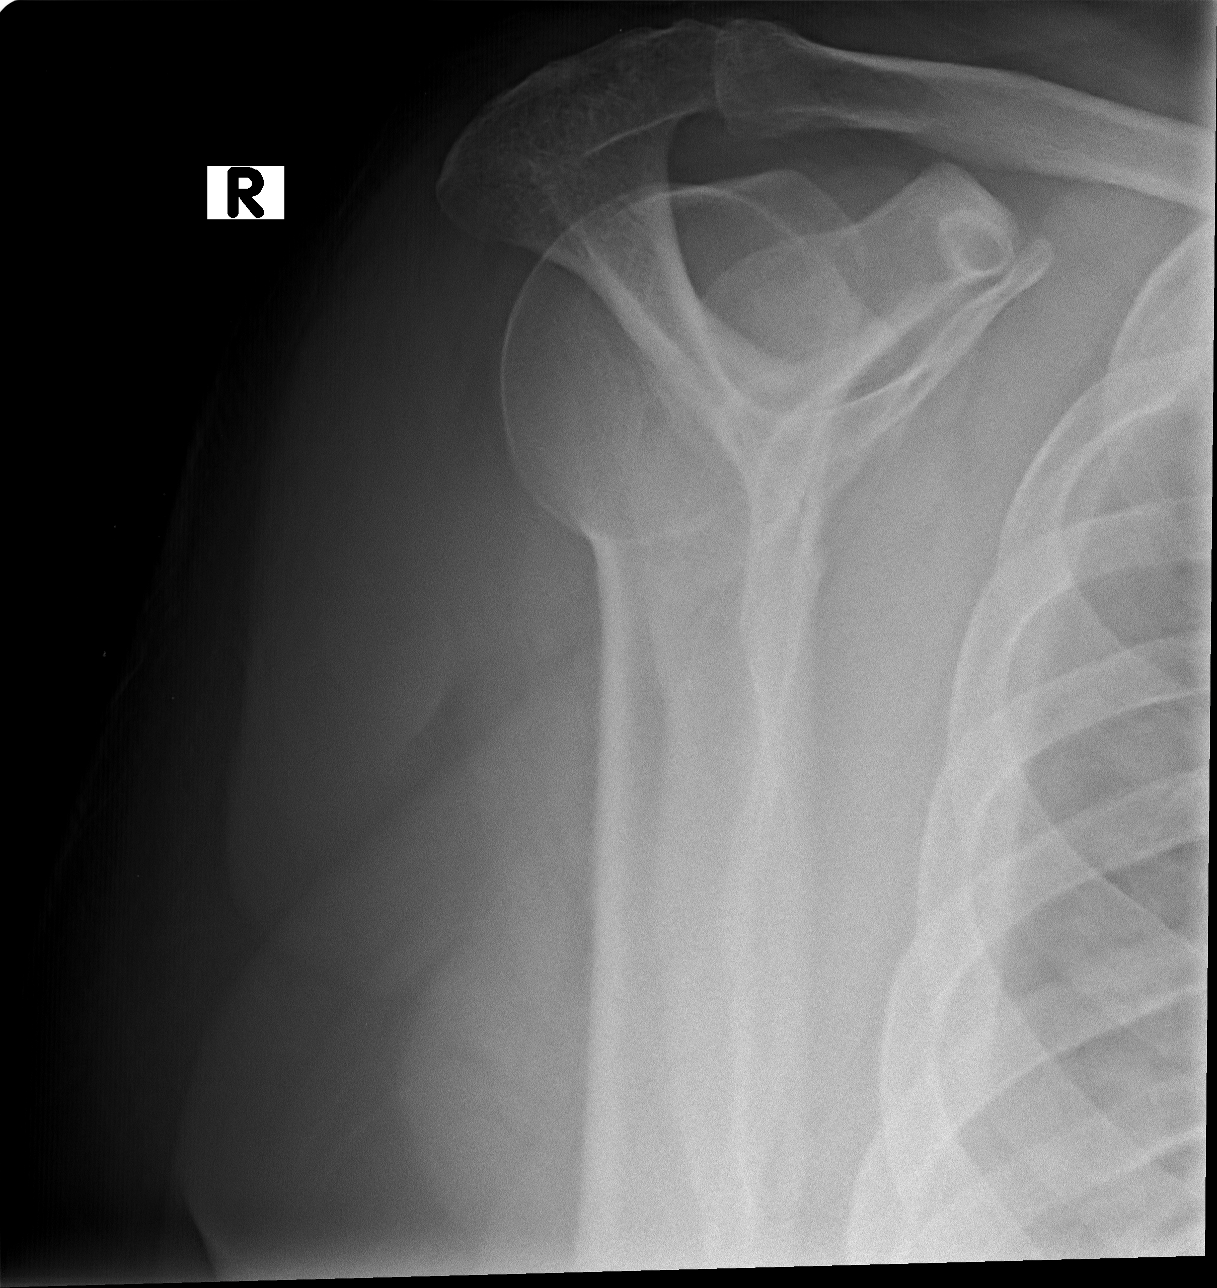

[3 of 3 positions shown; findings below may reference images not displayed]

FINDINGS: The bones are adequately mineralized. The glenohumeral joint space
is preserved. There is mild irregularity of the posterior articular
margin of the bony glenoid. The humeral head appears intact. There
are small osteophytes arising from the articular margins of the
distal right clavicle and the acromion. The observed portions of the
clavicle and upper right ribs are normal. There is mild pleural
thickening along the convexity of the right upper lobe.
IMPRESSION: There are mild degenerative changes of the right glenohumeral and AC
joint. There is no acute bony abnormality.

## 2016-05-10 ENCOUNTER — Other Ambulatory Visit (INDEPENDENT_AMBULATORY_CARE_PROVIDER_SITE_OTHER): Payer: BLUE CROSS/BLUE SHIELD

## 2016-05-10 DIAGNOSIS — E1121 Type 2 diabetes mellitus with diabetic nephropathy: Secondary | ICD-10-CM | POA: Diagnosis not present

## 2016-05-10 DIAGNOSIS — E1169 Type 2 diabetes mellitus with other specified complication: Secondary | ICD-10-CM

## 2016-05-10 DIAGNOSIS — I10 Essential (primary) hypertension: Secondary | ICD-10-CM

## 2016-05-10 DIAGNOSIS — E291 Testicular hypofunction: Secondary | ICD-10-CM

## 2016-05-10 DIAGNOSIS — E785 Hyperlipidemia, unspecified: Secondary | ICD-10-CM | POA: Diagnosis not present

## 2016-05-10 LAB — BAYER DCA HB A1C WAIVED: HB A1C: 7.1 % — AB (ref <7.0)

## 2016-05-11 LAB — CBC WITH DIFFERENTIAL/PLATELET
BASOS ABS: 0 10*3/uL (ref 0.0–0.2)
Basos: 0 %
EOS (ABSOLUTE): 0.4 10*3/uL (ref 0.0–0.4)
Eos: 4 %
HEMOGLOBIN: 17.4 g/dL (ref 13.0–17.7)
Hematocrit: 50.3 % (ref 37.5–51.0)
IMMATURE GRANS (ABS): 0 10*3/uL (ref 0.0–0.1)
IMMATURE GRANULOCYTES: 0 %
LYMPHS: 25 %
Lymphocytes Absolute: 2.4 10*3/uL (ref 0.7–3.1)
MCH: 31 pg (ref 26.6–33.0)
MCHC: 34.6 g/dL (ref 31.5–35.7)
MCV: 90 fL (ref 79–97)
MONOCYTES: 10 %
Monocytes Absolute: 0.9 10*3/uL (ref 0.1–0.9)
NEUTROS PCT: 61 %
Neutrophils Absolute: 6 10*3/uL (ref 1.4–7.0)
PLATELETS: 184 10*3/uL (ref 150–379)
RBC: 5.62 x10E6/uL (ref 4.14–5.80)
RDW: 13.8 % (ref 12.3–15.4)
WBC: 9.7 10*3/uL (ref 3.4–10.8)

## 2016-05-11 LAB — LIPID PANEL
CHOL/HDL RATIO: 2.7 ratio (ref 0.0–5.0)
CHOLESTEROL TOTAL: 118 mg/dL (ref 100–199)
HDL: 43 mg/dL (ref >39)
LDL Calculated: 38 mg/dL (ref 0–99)
Triglycerides: 187 mg/dL — ABNORMAL HIGH (ref 0–149)
VLDL CHOLESTEROL CAL: 37 mg/dL (ref 5–40)

## 2016-05-11 LAB — CMP14+EGFR
ALT: 24 IU/L (ref 0–44)
AST: 20 IU/L (ref 0–40)
Albumin/Globulin Ratio: 1.7 (ref 1.2–2.2)
Albumin: 4.4 g/dL (ref 3.5–5.5)
Alkaline Phosphatase: 73 IU/L (ref 39–117)
BUN/Creatinine Ratio: 19 (ref 9–20)
BUN: 21 mg/dL (ref 6–24)
Bilirubin Total: 0.7 mg/dL (ref 0.0–1.2)
CALCIUM: 9.6 mg/dL (ref 8.7–10.2)
CHLORIDE: 94 mmol/L — AB (ref 96–106)
CO2: 25 mmol/L (ref 18–29)
CREATININE: 1.11 mg/dL (ref 0.76–1.27)
GFR, EST AFRICAN AMERICAN: 84 mL/min/{1.73_m2} (ref >59)
GFR, EST NON AFRICAN AMERICAN: 72 mL/min/{1.73_m2} (ref >59)
GLUCOSE: 143 mg/dL — AB (ref 65–99)
Globulin, Total: 2.6 g/dL (ref 1.5–4.5)
Potassium: 5.1 mmol/L (ref 3.5–5.2)
Sodium: 138 mmol/L (ref 134–144)
Total Protein: 7 g/dL (ref 6.0–8.5)

## 2016-05-11 LAB — TESTOSTERONE,FREE AND TOTAL
TESTOSTERONE FREE: 11.4 pg/mL (ref 7.2–24.0)
TESTOSTERONE: 251 ng/dL — AB (ref 264–916)

## 2016-05-14 ENCOUNTER — Ambulatory Visit (INDEPENDENT_AMBULATORY_CARE_PROVIDER_SITE_OTHER): Payer: BLUE CROSS/BLUE SHIELD | Admitting: Family Medicine

## 2016-05-14 ENCOUNTER — Encounter: Payer: Self-pay | Admitting: Family Medicine

## 2016-05-14 VITALS — BP 138/78 | HR 76 | Temp 97.6°F | Ht 72.0 in | Wt 299.0 lb

## 2016-05-14 DIAGNOSIS — E1169 Type 2 diabetes mellitus with other specified complication: Secondary | ICD-10-CM

## 2016-05-14 DIAGNOSIS — E785 Hyperlipidemia, unspecified: Secondary | ICD-10-CM

## 2016-05-14 DIAGNOSIS — I10 Essential (primary) hypertension: Secondary | ICD-10-CM

## 2016-05-14 DIAGNOSIS — E291 Testicular hypofunction: Secondary | ICD-10-CM

## 2016-05-14 DIAGNOSIS — E1121 Type 2 diabetes mellitus with diabetic nephropathy: Secondary | ICD-10-CM

## 2016-05-14 LAB — URINALYSIS
BILIRUBIN UA: NEGATIVE
KETONES UA: NEGATIVE
Leukocytes, UA: NEGATIVE
Nitrite, UA: NEGATIVE
PH UA: 5.5 (ref 5.0–7.5)
Protein, UA: NEGATIVE
SPEC GRAV UA: 1.01 (ref 1.005–1.030)
Urobilinogen, Ur: 1 mg/dL (ref 0.2–1.0)

## 2016-05-14 MED ORDER — GLIPIZIDE 5 MG PO TABS: 5.0000 mg | ORAL_TABLET | Freq: Every morning | ORAL | 1 refills | Status: DC

## 2016-05-14 MED ORDER — SITAGLIPTIN PHOS-METFORMIN HCL 50-1000 MG PO TABS: 1.0000 | ORAL_TABLET | Freq: Two times a day (BID) | ORAL | 3 refills | Status: DC

## 2016-05-14 MED ORDER — TESTOSTERONE 4 MG/24HR TD PT24
2.0000 | MEDICATED_PATCH | Freq: Every day | TRANSDERMAL | 5 refills | Status: DC
Start: 2016-05-14 — End: 2016-08-18

## 2016-05-14 MED ORDER — AMLODIPINE BESYLATE 10 MG PO TABS: 10.0000 mg | ORAL_TABLET | Freq: Every day | ORAL | 1 refills | Status: DC

## 2016-05-14 NOTE — Progress Notes (Signed)
Subjective:  Patient ID: Eugene Bautista, male    DOB: 1955/04/13  Age: 61 y.o. MRN: [id number]  CC: Diabetes (pt here today for routine follow up on his diabetes and he had his blood work earlier this week.)   HPI Eugene Bautista presents forFollow-up of diabetes. Patient rarely checks blood sugar at home.  Patient denies symptoms such as polyuria, polydipsia, excessive hunger, nausea No significant hypoglycemic spells noted. Medications reviewed. Pt reports taking them regularly without complication/adverse reaction being reported today.  Checking feet daily. Last eye appt was April 2017  Patient says he took the patches for a couple months for his testosterone and the level only went up a few points based on a check done by the nurse at his work. He didn't feel any better so he discontinued the medication.  Patient in for follow-up of elevated cholesterol. Doing well without complaints on current medication. Denies side effects of statin including myalgia and arthralgia and nausea. Also in today for liver function testing. Currently no chest pain, shortness of breath or other cardiovascular related symptoms noted.   follow-up of hypertension. Patient has no history of headache chest pain or shortness of breath or recent cough. Patient also denies symptoms of TIA such as numbness weakness lateralizing. Patient checks  blood pressure at home and has not had any elevated readings recently. Patient denies side effects from his medication. States taking it regularly.   History Eugene Bautista has a past medical history of Carotid artery occlusion; Diabetes mellitus without complication (HCC); Hyperlipidemia; Hypertension; Left carotid bruit; and Obesity.   He has a past surgical history that includes Ruptured Aorta Repair (1989); Urinary surgery (1989); and Fracture surgery (Left, 1989).   His family history includes Aneurysm (age of onset: 81) in his sister; Cancer in his brother;  Dementia in his mother; Diabetes in his father; Healthy in his daughter; Heart attack (age of onset: 32) in his father; Heart disease in his father; Kidney disease in his father.He reports that he quit smoking about 15 years ago. His smoking use included Cigarettes. He has a 43.75 pack-year smoking history. He has never used smokeless tobacco. He reports that he drinks about 3.6 oz of alcohol per week . He reports that he does not use drugs.  Current Outpatient Prescriptions on File Prior to Visit  Medication Sig Dispense Refill  . aspirin 81 MG tablet Take 81 mg by mouth daily.    . cholecalciferol (VITAMIN D) 1000 UNITS tablet Take 2,000 Units by mouth daily.    . fish oil-omega-3 fatty acids 1000 MG capsule Take 1 g by mouth 2 (two) times daily.    . INVOKANA 300 MG TABS tablet TAKE 1 TABLET BY MOUTH  DAILY BEFORE BREAKFAST 90 tablet 0  . metoprolol succinate (TOPROL-XL) 25 MG 24 hr tablet TAKE 1 TABLET BY MOUTH  DAILY 90 tablet 0  . rosuvastatin (CRESTOR) 10 MG tablet TAKE 1 TABLET BY MOUTH AT  BEDTIME 90 tablet 0  . valsartan-hydrochlorothiazide (DIOVAN-HCT) 320-25 MG tablet Take 1 tablet by mouth  daily 90 tablet 1   No current facility-administered medications on file prior to visit.     ROS Review of Systems  Constitutional: Negative for chills, diaphoresis, fever and unexpected weight change.  HENT: Negative for congestion, hearing loss, rhinorrhea and sore throat.   Eyes: Negative for visual disturbance.  Respiratory: Negative for cough and shortness of breath.   Cardiovascular: Negative for chest pain.  Gastrointestinal: Negative for abdominal pain, constipation and  diarrhea.  Genitourinary: Negative for dysuria and flank pain.  Musculoskeletal: Negative for arthralgias and joint swelling.  Skin: Negative for rash.  Neurological: Negative for dizziness and headaches.  Psychiatric/Behavioral: Negative for dysphoric mood and sleep disturbance.    Objective:  BP 138/78   Pulse  76   Temp 97.6 F (36.4 C) (Oral)   Ht 6' (1.829 m)   Wt 299 lb (135.6 kg)   BMI 40.55 kg/m   BP Readings from Last 3 Encounters:  05/14/16 138/78  12/02/15 113/73  10/08/15 122/73    Wt Readings from Last 3 Encounters:  05/14/16 299 lb (135.6 kg)  12/02/15 295 lb (133.8 kg)  10/08/15 298 lb 6.4 oz (135.4 kg)     Physical Exam  Constitutional: He is oriented to person, place, and time. He appears well-developed and well-nourished. No distress.  HENT:  Head: Normocephalic and atraumatic.  Right Ear: External ear normal.  Left Ear: External ear normal.  Nose: Nose normal.  Mouth/Throat: Oropharynx is clear and moist.  Eyes: Conjunctivae and EOM are normal. Pupils are equal, round, and reactive to light.  Neck: Normal range of motion. Neck supple. No thyromegaly present.  Cardiovascular: Normal rate, regular rhythm and normal heart sounds.   No murmur heard. Pulmonary/Chest: Effort normal and breath sounds normal. No respiratory distress. He has no wheezes. He has no rales.  Abdominal: Soft. Bowel sounds are normal. He exhibits no distension. There is no tenderness.  Lymphadenopathy:    He has no cervical adenopathy.  Neurological: He is alert and oriented to person, place, and time. He has normal reflexes.  Skin: Skin is warm and dry.  Psychiatric: He has a normal mood and affect. His behavior is normal. Judgment and thought content normal.    No components found for: BAYER     Assessment & Plan:   Eugene Bautista was seen today for diabetes.  Diagnoses and all orders for this visit:  Hypogonadism, testicular  Dyslipidemia associated with type 2 diabetes mellitus (HCC)  Essential hypertension  Type 2 diabetes mellitus with diabetic nephropathy, without long-term current use of insulin (HCC) -     Microalbumin / creatinine urine ratio -     Urinalysis  Other orders -     amLODipine (NORVASC) 10 MG tablet; Take 1 tablet (10 mg total) by mouth daily. -     glipiZIDE  (GLUCOTROL) 5 MG tablet; Take 1 tablet (5 mg total) by mouth every morning. -     sitaGLIPtin-metformin (JANUMET) 50-1000 MG tablet; Take 1 tablet by mouth 2 (two) times daily. -     testosterone (ANDRODERM) 4 MG/24HR PT24 patch; Place 2 patches onto the skin daily.      I have changed Eugene Bautista's JANUMET to sitaGLIPtin-metformin. I have also changed his amLODipine, glipiZIDE, and testosterone. I am also having him maintain his aspirin, fish oil-omega-3 fatty acids, cholecalciferol, valsartan-hydrochlorothiazide, INVOKANA, metoprolol succinate, and rosuvastatin.  Meds ordered this encounter  Medications  . amLODipine (NORVASC) 10 MG tablet    Sig: Take 1 tablet (10 mg total) by mouth daily.    Dispense:  90 tablet    Refill:  1  . glipiZIDE (GLUCOTROL) 5 MG tablet    Sig: Take 1 tablet (5 mg total) by mouth every morning.    Dispense:  90 tablet    Refill:  1  . sitaGLIPtin-metformin (JANUMET) 50-1000 MG tablet    Sig: Take 1 tablet by mouth 2 (two) times daily.    Dispense:  180 tablet  Refill:  3  . testosterone (ANDRODERM) 4 MG/24HR PT24 patch    Sig: Place 2 patches onto the skin daily.    Dispense:  60 patch    Refill:  5   We reviewed in detail the need for weight loss. We discussed the association of his exercise and activity levels with the tendency to gain weight. We discussed diet. He is aware that most of his conditions will be significantly improved by bringing his weight under control.  Follow-up: Return in about 3 months (around 08/14/2016).  Mechele Claude, M.D.

## 2016-05-15 LAB — MICROALBUMIN / CREATININE URINE RATIO
CREATININE, UR: 69 mg/dL
Microalb/Creat Ratio: 8.8 mg/g creat (ref 0.0–30.0)
Microalbumin, Urine: 6.1 ug/mL

## 2016-05-19 ENCOUNTER — Encounter: Payer: Self-pay | Admitting: Family Medicine

## 2016-05-19 ENCOUNTER — Ambulatory Visit (INDEPENDENT_AMBULATORY_CARE_PROVIDER_SITE_OTHER): Payer: BLUE CROSS/BLUE SHIELD | Admitting: Family Medicine

## 2016-05-19 ENCOUNTER — Encounter: Payer: Self-pay | Admitting: *Deleted

## 2016-05-19 VITALS — BP 127/68 | HR 74 | Temp 98.9°F | Ht 72.0 in | Wt 294.0 lb

## 2016-05-19 DIAGNOSIS — K1120 Sialoadenitis, unspecified: Secondary | ICD-10-CM

## 2016-05-19 DIAGNOSIS — R599 Enlarged lymph nodes, unspecified: Secondary | ICD-10-CM | POA: Diagnosis not present

## 2016-05-19 DIAGNOSIS — R6889 Other general symptoms and signs: Secondary | ICD-10-CM

## 2016-05-19 LAB — VERITOR FLU A/B WAIVED
INFLUENZA A: NEGATIVE
Influenza B: NEGATIVE

## 2016-05-19 LAB — RAPID STREP SCREEN (MED CTR MEBANE ONLY): STREP GP A AG, IA W/REFLEX: NEGATIVE

## 2016-05-19 LAB — CULTURE, GROUP A STREP

## 2016-05-19 MED ORDER — SULFAMETHOXAZOLE-TRIMETHOPRIM 800-160 MG PO TABS: 1.0000 | ORAL_TABLET | Freq: Two times a day (BID) | ORAL | 0 refills | Status: DC

## 2016-05-19 NOTE — Progress Notes (Signed)
BP 127/68 (BP Location: Left Arm)   Pulse 74   Temp 98.9 F (37.2 C) (Oral)   Ht 6' (1.829 m)   Wt 294 lb (133.4 kg)   BMI 39.87 kg/m    Subjective:    Patient ID: Eugene Bautista, male    DOB: [birth year], 61 y.o.   MRN: [id number]  HPI: Eugene Bautista is a 61 y.o. male presenting on 05/19/2016 for Generalized Body Aches (glands swollen )   HPI Generalized body aches and swollen gland Patient has been having generalized body aches and a swollen gland on the right side of his face that's been going on for the past few days. He denies any fevers or chills but just has the body aches. He denies any cough or congestion or abdominal symptoms. He denies any sore throat or sinus pressure or sinus symptoms. He says realistically is just that right side of the gland on his face that's swollen and he does feel like he is having increased salivary production.  Relevant past medical, surgical, family and social history reviewed and updated as indicated. Interim medical history since our last visit reviewed. Allergies and medications reviewed and updated.  Review of Systems  Constitutional: Negative for chills and fever.  HENT: Negative for congestion, ear pain, mouth sores, rhinorrhea, sinus pain, sinus pressure, sneezing and sore throat.   Eyes: Negative for discharge.  Respiratory: Negative for cough, shortness of breath and wheezing.   Cardiovascular: Negative for chest pain and leg swelling.  Gastrointestinal: Negative for abdominal pain, constipation, diarrhea, nausea and vomiting.  Genitourinary: Negative for decreased urine volume, flank pain, hematuria and scrotal swelling.  Musculoskeletal: Negative for back pain and gait problem.  Skin: Negative for rash.  All other systems reviewed and are negative.   Per HPI unless specifically indicated above     Objective:    BP 127/68 (BP Location: Left Arm)   Pulse 74   Temp 98.9 F (37.2 C) (Oral)   Ht 6' (1.829 m)   Wt  294 lb (133.4 kg)   BMI 39.87 kg/m   Wt Readings from Last 3 Encounters:  05/19/16 294 lb (133.4 kg)  05/14/16 299 lb (135.6 kg)  12/02/15 295 lb (133.8 kg)    Physical Exam  Constitutional: He is oriented to person, place, and time. He appears well-developed and well-nourished. No distress.  HENT:  Right Ear: External ear normal.  Left Ear: External ear normal.  Nose: Nose normal.  Mouth/Throat: Oropharynx is clear and moist. No oropharyngeal exudate.  Eyes: Conjunctivae are normal. No scleral icterus.  Neck: Neck supple. No thyromegaly present.  Cardiovascular: Normal rate, regular rhythm, normal heart sounds and intact distal pulses.   No murmur heard. Pulmonary/Chest: Effort normal and breath sounds normal. No respiratory distress. He has no wheezes.  Abdominal: Soft. Bowel sounds are normal. He exhibits no distension. There is no tenderness. There is no rebound and no guarding.  Musculoskeletal: Normal range of motion. He exhibits no edema.  Lymphadenopathy:       Head (right side): Posterior auricular (Postauricular swelling, concern for salivary glands infection, has some thicker salivary discharge from underneath his tongue.) adenopathy present.  Neurological: He is alert and oriented to person, place, and time. Coordination normal.  Skin: Skin is warm and dry. No rash noted. He is not diaphoretic.  Psychiatric: He has a normal mood and affect. His behavior is normal.  Nursing note and vitals reviewed.     Assessment & Plan:  Problem List Items Addressed This Visit    None    Visit Diagnoses    Flu-like symptoms    -  Primary   Relevant Orders   Veritor Flu A/B Waived   Glands swollen       Relevant Medications   sulfamethoxazole-trimethoprim (BACTRIM DS,SEPTRA DS) 800-160 MG tablet   Other Relevant Orders   [facility]apid st[facility] screen ([facility])   Salivary gland infection       Relevant Medications   sulfamethoxazole-trimethoprim (BACTRIM DS,SEPTRA DS) 800-160 MG  tablet   Other Relevant Orders   Rapid strep screen ([facility])       Follow up plan: Return if symptoms worsen or fail to improve.  Counseling provided for all of the vaccine components Orders Placed This Encounter  Procedures  . Rapid [facility])  . Veritor Flu A/B Reynolds BowlWaived    Joshua Dettinger, MD [facility] 05/19/2016, 6:21 PM

## 2016-05-24 ENCOUNTER — Telehealth: Payer: Self-pay

## 2016-05-24 ENCOUNTER — Other Ambulatory Visit: Payer: Self-pay | Admitting: *Deleted

## 2016-05-24 DIAGNOSIS — K1120 Sialoadenitis, unspecified: Secondary | ICD-10-CM

## 2016-05-24 DIAGNOSIS — E669 Obesity, unspecified: Secondary | ICD-10-CM | POA: Diagnosis not present

## 2016-05-24 DIAGNOSIS — R599 Enlarged lymph nodes, unspecified: Secondary | ICD-10-CM

## 2016-05-24 DIAGNOSIS — Z6841 Body Mass Index (BMI) 40.0 and over, adult: Secondary | ICD-10-CM | POA: Diagnosis not present

## 2016-05-24 NOTE — Telephone Encounter (Signed)
Please refer as requested 

## 2016-05-24 NOTE — Telephone Encounter (Signed)
Referral placed and patient aware 

## 2016-05-27 ENCOUNTER — Other Ambulatory Visit: Payer: Self-pay | Admitting: Pediatrics

## 2016-05-27 ENCOUNTER — Other Ambulatory Visit: Payer: Self-pay | Admitting: Family Medicine

## 2016-06-04 DIAGNOSIS — K1120 Sialoadenitis, unspecified: Secondary | ICD-10-CM | POA: Diagnosis not present

## 2016-08-04 DIAGNOSIS — Z6841 Body Mass Index (BMI) 40.0 and over, adult: Secondary | ICD-10-CM | POA: Diagnosis not present

## 2016-08-04 DIAGNOSIS — Z008 Encounter for other general examination: Secondary | ICD-10-CM | POA: Diagnosis not present

## 2016-08-04 DIAGNOSIS — E785 Hyperlipidemia, unspecified: Secondary | ICD-10-CM | POA: Diagnosis not present

## 2016-08-04 DIAGNOSIS — E119 Type 2 diabetes mellitus without complications: Secondary | ICD-10-CM | POA: Diagnosis not present

## 2016-08-04 DIAGNOSIS — Z719 Counseling, unspecified: Secondary | ICD-10-CM | POA: Diagnosis not present

## 2016-08-04 DIAGNOSIS — I10 Essential (primary) hypertension: Secondary | ICD-10-CM | POA: Diagnosis not present

## 2016-08-04 DIAGNOSIS — E669 Obesity, unspecified: Secondary | ICD-10-CM | POA: Diagnosis not present

## 2016-08-04 DIAGNOSIS — E291 Testicular hypofunction: Secondary | ICD-10-CM | POA: Diagnosis not present

## 2016-08-12 ENCOUNTER — Other Ambulatory Visit: Payer: BLUE CROSS/BLUE SHIELD

## 2016-08-12 DIAGNOSIS — E1169 Type 2 diabetes mellitus with other specified complication: Secondary | ICD-10-CM

## 2016-08-12 DIAGNOSIS — I10 Essential (primary) hypertension: Secondary | ICD-10-CM | POA: Diagnosis not present

## 2016-08-12 DIAGNOSIS — E1121 Type 2 diabetes mellitus with diabetic nephropathy: Secondary | ICD-10-CM | POA: Diagnosis not present

## 2016-08-12 DIAGNOSIS — E291 Testicular hypofunction: Secondary | ICD-10-CM

## 2016-08-12 DIAGNOSIS — E785 Hyperlipidemia, unspecified: Secondary | ICD-10-CM | POA: Diagnosis not present

## 2016-08-13 ENCOUNTER — Ambulatory Visit: Payer: BLUE CROSS/BLUE SHIELD | Admitting: Family Medicine

## 2016-08-13 LAB — CBC WITH DIFFERENTIAL/PLATELET
BASOS: 0 %
Basophils Absolute: 0 10*3/uL (ref 0.0–0.2)
EOS (ABSOLUTE): 0.2 10*3/uL (ref 0.0–0.4)
Eos: 3 %
HEMATOCRIT: 48.6 % (ref 37.5–51.0)
HEMOGLOBIN: 16.8 g/dL (ref 13.0–17.7)
IMMATURE GRANS (ABS): 0 10*3/uL (ref 0.0–0.1)
Immature Granulocytes: 0 %
LYMPHS ABS: 1.7 10*3/uL (ref 0.7–3.1)
LYMPHS: 24 %
MCH: 30.7 pg (ref 26.6–33.0)
MCHC: 34.6 g/dL (ref 31.5–35.7)
MCV: 89 fL (ref 79–97)
MONOCYTES: 14 %
Monocytes Absolute: 1 10*3/uL — ABNORMAL HIGH (ref 0.1–0.9)
NEUTROS ABS: 4.3 10*3/uL (ref 1.4–7.0)
Neutrophils: 59 %
Platelets: 179 10*3/uL (ref 150–379)
RBC: 5.47 x10E6/uL (ref 4.14–5.80)
RDW: 14.6 % (ref 12.3–15.4)
WBC: 7.2 10*3/uL (ref 3.4–10.8)

## 2016-08-13 LAB — CMP14+EGFR
A/G RATIO: 1.5 (ref 1.2–2.2)
ALT: 24 IU/L (ref 0–44)
AST: 15 IU/L (ref 0–40)
Albumin: 4.3 g/dL (ref 3.5–5.5)
Alkaline Phosphatase: 64 IU/L (ref 39–117)
BILIRUBIN TOTAL: 0.6 mg/dL (ref 0.0–1.2)
BUN / CREAT RATIO: 24 — AB (ref 9–20)
BUN: 29 mg/dL — AB (ref 6–24)
CHLORIDE: 102 mmol/L (ref 96–106)
CO2: 22 mmol/L (ref 18–29)
Calcium: 9.7 mg/dL (ref 8.7–10.2)
Creatinine, Ser: 1.23 mg/dL (ref 0.76–1.27)
GFR calc non Af Amer: 64 mL/min/{1.73_m2} (ref >59)
GFR, EST AFRICAN AMERICAN: 74 mL/min/{1.73_m2} (ref >59)
GLOBULIN, TOTAL: 2.8 g/dL (ref 1.5–4.5)
Glucose: 133 mg/dL — ABNORMAL HIGH (ref 65–99)
Potassium: 5.2 mmol/L (ref 3.5–5.2)
SODIUM: 139 mmol/L (ref 134–144)
TOTAL PROTEIN: 7.1 g/dL (ref 6.0–8.5)

## 2016-08-13 LAB — LIPID PANEL
Chol/HDL Ratio: 2.7 ratio (ref 0.0–5.0)
Cholesterol, Total: 113 mg/dL (ref 100–199)
HDL: 42 mg/dL (ref >39)
LDL Calculated: 35 mg/dL (ref 0–99)
Triglycerides: 181 mg/dL — ABNORMAL HIGH (ref 0–149)
VLDL Cholesterol Cal: 36 mg/dL (ref 5–40)

## 2016-08-13 LAB — TESTOSTERONE,FREE AND TOTAL
TESTOSTERONE: 175 ng/dL — AB (ref 264–916)
Testosterone, Free: 9.9 pg/mL (ref 7.2–24.0)

## 2016-08-18 ENCOUNTER — Encounter: Payer: Self-pay | Admitting: Family Medicine

## 2016-08-18 ENCOUNTER — Ambulatory Visit (INDEPENDENT_AMBULATORY_CARE_PROVIDER_SITE_OTHER): Payer: BLUE CROSS/BLUE SHIELD | Admitting: Family Medicine

## 2016-08-18 VITALS — BP 124/70 | HR 76 | Temp 97.5°F | Ht 72.0 in | Wt 292.0 lb

## 2016-08-18 DIAGNOSIS — E785 Hyperlipidemia, unspecified: Secondary | ICD-10-CM | POA: Diagnosis not present

## 2016-08-18 DIAGNOSIS — E1121 Type 2 diabetes mellitus with diabetic nephropathy: Secondary | ICD-10-CM

## 2016-08-18 DIAGNOSIS — E1169 Type 2 diabetes mellitus with other specified complication: Secondary | ICD-10-CM | POA: Diagnosis not present

## 2016-08-18 DIAGNOSIS — I10 Essential (primary) hypertension: Secondary | ICD-10-CM | POA: Diagnosis not present

## 2016-08-18 DIAGNOSIS — E291 Testicular hypofunction: Secondary | ICD-10-CM

## 2016-08-18 DIAGNOSIS — N182 Chronic kidney disease, stage 2 (mild): Secondary | ICD-10-CM

## 2016-08-18 LAB — BAYER DCA HB A1C WAIVED: HB A1C: 6.7 % (ref <7.0)

## 2016-08-18 MED ORDER — TESTOSTERONE 10 MG/ACT (2%) TD GEL: TRANSDERMAL | 5 refills | Status: DC

## 2016-08-18 MED ORDER — CIPROFLOXACIN-HYDROCORTISONE 0.2-1 % OT SUSP: 3.0000 [drp] | Freq: Two times a day (BID) | OTIC | 0 refills | Status: DC

## 2016-08-18 NOTE — Patient Instructions (Signed)
Earwax removal: ° °Debrox drops are available without a prescription at your pharmacy. ° °Lay on your side with the ear up that you want to treat. Place for 5 drops of the Debrox in the ear canal and lay still for 15 minutes. After that time you considered up and allow the excess to run out of the year and catch it with a Kleenex. Repeat this with the other ear if needed. ° °Repeat this process daily for 1 week. By that time the ear should feel less clogged and her hearing should be better, if not, follow up in the office for recheck of the ear. ° °Thanks, °Daisi Kentner °

## 2016-08-18 NOTE — Progress Notes (Signed)
Subjective:  Patient ID: Eugene Bautista,  male    DOB: 23-Apr-1955  Age: 61 y.o.    CC: Diabetes (pt here today for routine follow up of his chronic medical conditions)   HPI Eugene ReadyWilliam Clay Bautista presents for  follow-up of hypertension. Patient has no history of headache chest pain or shortness of breath or recent cough. Patient also denies symptoms of TIA such as numbness weakness lateralizing. Patient checks  blood pressure at home. Recent readings have been good Patient denies side effects from medication. States taking it regularly.  Patient also  in for follow-up of elevated cholesterol. Doing well without complaints on current medication. Denies side effects of statin including myalgia and arthralgia and nausea. Also in today for liver function testing. Currently no chest pain, shortness of breath or other cardiovascular related symptoms noted.  Follow-up of diabetes. Patient does check blood sugar at home. Readings run between 13 and 150 fasting. Postprandial under 100 Patient denies symptoms such as polyuria, polydipsia, excessive hunger, nausea No significant hypoglycemic spells noted. Medications reviewed. Pt reports taking them regularly. Pt. denies complication/adverse reaction today.   Pt. Having difficulty getting testosterone products due to insurance restrictions. Uncomfortable with the idea of routine injections.   History Eugene Bautista has a past medical history of Carotid artery occlusion; Diabetes mellitus without complication (HCC); Hyperlipidemia; Hypertension; Left carotid bruit; and Obesity.   He has a past surgical history that includes Ruptured Aorta Repair (1989); Urinary surgery (1989); and Fracture surgery (Left, 1989).   His family history includes Aneurysm (age of onset: 1859) in his sister; Cancer in his brother; Dementia in his mother; Diabetes in his father; Healthy in his daughter; Heart attack (age of onset: 6880) in his father; Heart disease in his father;  Kidney disease in his father.He reports that he quit smoking about 15 years ago. His smoking use included Cigarettes. He has a 43.75 pack-year smoking history. He has never used smokeless tobacco. He reports that he drinks about 3.6 oz of alcohol per week . He reports that he does not use drugs.  Current Outpatient Prescriptions on File Prior to Visit  Medication Sig Dispense Refill  . amLODipine (NORVASC) 10 MG tablet Take 1 tablet (10 mg total) by mouth daily. 90 tablet 1  . aspirin 81 MG tablet Take 81 mg by mouth daily.    . cholecalciferol (VITAMIN D) 1000 UNITS tablet Take 2,000 Units by mouth daily.    . fish oil-omega-3 fatty acids 1000 MG capsule Take 1 g by mouth 2 (two) times daily.    Marland Kitchen. glipiZIDE (GLUCOTROL) 5 MG tablet Take 1 tablet (5 mg total) by mouth every morning. 90 tablet 1  . INVOKANA 300 MG TABS tablet TAKE 1 TABLET BY MOUTH  DAILY BEFORE BREAKFAST 90 tablet 0  . metoprolol succinate (TOPROL-XL) 25 MG 24 hr tablet TAKE 1 TABLET BY MOUTH  DAILY 90 tablet 1  . rosuvastatin (CRESTOR) 10 MG tablet TAKE 1 TABLET BY MOUTH AT  BEDTIME 90 tablet 1  . sitaGLIPtin-metformin (JANUMET) 50-1000 MG tablet Take 1 tablet by mouth 2 (two) times daily. 180 tablet 3  . valsartan-hydrochlorothiazide (DIOVAN-HCT) 320-25 MG tablet TAKE 1 TABLET BY MOUTH  DAILY 90 tablet 1   No current facility-administered medications on file prior to visit.     ROS Review of Systems  Constitutional: Negative for chills, diaphoresis, fever and unexpected weight change.  HENT: Negative for congestion, hearing loss, rhinorrhea and sore throat.   Eyes: Negative for visual disturbance.  Respiratory: Negative for cough and shortness of breath.   Cardiovascular: Negative for chest pain.  Gastrointestinal: Negative for abdominal pain, constipation and diarrhea.  Genitourinary: Negative for dysuria and flank pain.  Musculoskeletal: Negative for arthralgias and joint swelling.  Skin: Negative for rash.    Neurological: Negative for dizziness and headaches.  Psychiatric/Behavioral: Negative for dysphoric mood and sleep disturbance.    Objective:  BP 124/70   Pulse 76   Temp 97.5 F (36.4 C) (Oral)   Ht 6' (1.829 m)   Wt 292 lb (132.5 kg)   BMI 39.60 kg/m   BP Readings from Last 3 Encounters:  08/18/16 124/70  05/19/16 127/68  05/14/16 138/78    Wt Readings from Last 3 Encounters:  08/18/16 292 lb (132.5 kg)  05/19/16 294 lb (133.4 kg)  05/14/16 299 lb (135.6 kg)     Physical Exam  Constitutional: He is oriented to person, place, and time. He appears well-developed and well-nourished. No distress.  HENT:  Head: Normocephalic and atraumatic.  Right Ear: External ear normal.  Left Ear: External ear normal.  Nose: Nose normal.  Mouth/Throat: Oropharynx is clear and moist.  Eyes: Conjunctivae and EOM are normal. Pupils are equal, round, and reactive to light.  Neck: Normal range of motion. Neck supple. No thyromegaly present.  Cardiovascular: Normal rate, regular rhythm and normal heart sounds.   No murmur heard. Pulmonary/Chest: Effort normal and breath sounds normal. No respiratory distress. He has no wheezes. He has no rales.  Abdominal: Soft. Bowel sounds are normal. He exhibits no distension. There is no tenderness.  Lymphadenopathy:    He has no cervical adenopathy.  Neurological: He is alert and oriented to person, place, and time. He has normal reflexes.  Skin: Skin is warm and dry.  Psychiatric: He has a normal mood and affect. His behavior is normal. Judgment and thought content normal.    Diabetic Foot Exam - Simple   Simple Foot Form Diabetic Foot exam was performed with the following findings:  Yes 08/18/2016  8:15 AM  Visual Inspection No deformities, no ulcerations, no other skin breakdown bilaterally:  Yes Sensation Testing Intact to touch and monofilament testing bilaterally:  Yes Pulse Check Posterior Tibialis and Dorsalis pulse intact bilaterally:   Yes Comments       Assessment & Plan:   Eugene Bautista was seen today for diabetes.  Diagnoses and all orders for this visit:  Type 2 diabetes mellitus with diabetic nephropathy, without long-term current use of insulin (HCC)  Dyslipidemia associated with type 2 diabetes mellitus (HCC)  Essential hypertension  Chronic renal impairment, stage 2 (mild)  Hypogonadism, testicular  Other orders -     Testosterone 10 MG/ACT (2%) GEL; Apply 2 pumps to each inner thigh daily -     ciprofloxacin-hydrocortisone (CIPRO HC OTIC) OTIC suspension; Place 3 drops into both ears 2 (two) times daily. For one week   I have discontinued Eugene Bautista testosterone and sulfamethoxazole-trimethoprim. I am also having him start on Testosterone and ciprofloxacin-hydrocortisone. Additionally, I am having him maintain his aspirin, fish oil-omega-3 fatty acids, cholecalciferol, INVOKANA, amLODipine, glipiZIDE, sitaGLIPtin-metformin, valsartan-hydrochlorothiazide, metoprolol succinate, and rosuvastatin.  Meds ordered this encounter  Medications  . Testosterone 10 MG/ACT (2%) GEL    Sig: Apply 2 pumps to each inner thigh daily    Dispense:  60 g    Refill:  5  . ciprofloxacin-hydrocortisone (CIPRO HC OTIC) OTIC suspension    Sig: Place 3 drops into both ears 2 (two) times daily. For one  week    Dispense:  10 mL    Refill:  0     Follow-up: Return in about 3 months (around 11/18/2016).  Mechele Claude, M.D.

## 2016-08-20 ENCOUNTER — Telehealth: Payer: Self-pay

## 2016-08-20 NOTE — Telephone Encounter (Signed)
Please contact the patient bled him know that the medication was denied and see if he wants to start testosterone injections

## 2016-08-20 NOTE — Telephone Encounter (Signed)
Left message for patient to call back  

## 2016-08-23 ENCOUNTER — Telehealth: Payer: Self-pay | Admitting: Family Medicine

## 2016-08-23 NOTE — Telephone Encounter (Signed)
FYI

## 2016-08-31 ENCOUNTER — Other Ambulatory Visit: Payer: Self-pay | Admitting: Family Medicine

## 2016-08-31 NOTE — Telephone Encounter (Signed)
Phone call completed in different encounter.  This encounter will now be closed 

## 2016-10-18 DIAGNOSIS — Z008 Encounter for other general examination: Secondary | ICD-10-CM | POA: Diagnosis not present

## 2016-10-18 DIAGNOSIS — Z6841 Body Mass Index (BMI) 40.0 and over, adult: Secondary | ICD-10-CM | POA: Diagnosis not present

## 2016-10-18 DIAGNOSIS — E119 Type 2 diabetes mellitus without complications: Secondary | ICD-10-CM | POA: Diagnosis not present

## 2016-10-18 DIAGNOSIS — E669 Obesity, unspecified: Secondary | ICD-10-CM | POA: Diagnosis not present

## 2016-10-18 DIAGNOSIS — Z719 Counseling, unspecified: Secondary | ICD-10-CM | POA: Diagnosis not present

## 2016-10-18 DIAGNOSIS — E291 Testicular hypofunction: Secondary | ICD-10-CM | POA: Diagnosis not present

## 2016-10-18 DIAGNOSIS — I10 Essential (primary) hypertension: Secondary | ICD-10-CM | POA: Diagnosis not present

## 2016-12-28 ENCOUNTER — Other Ambulatory Visit: Payer: BLUE CROSS/BLUE SHIELD

## 2016-12-28 DIAGNOSIS — E785 Hyperlipidemia, unspecified: Secondary | ICD-10-CM

## 2016-12-28 DIAGNOSIS — E1169 Type 2 diabetes mellitus with other specified complication: Secondary | ICD-10-CM | POA: Diagnosis not present

## 2016-12-28 DIAGNOSIS — I10 Essential (primary) hypertension: Secondary | ICD-10-CM

## 2016-12-28 DIAGNOSIS — E291 Testicular hypofunction: Secondary | ICD-10-CM

## 2016-12-28 DIAGNOSIS — E1121 Type 2 diabetes mellitus with diabetic nephropathy: Secondary | ICD-10-CM

## 2016-12-28 LAB — BAYER DCA HB A1C WAIVED: HB A1C: 6.9 % (ref <7.0)

## 2016-12-29 LAB — CMP14+EGFR
A/G RATIO: 2 (ref 1.2–2.2)
ALT: 22 IU/L (ref 0–44)
AST: 16 IU/L (ref 0–40)
Albumin: 4.7 g/dL (ref 3.5–5.5)
Alkaline Phosphatase: 69 IU/L (ref 39–117)
BILIRUBIN TOTAL: 0.8 mg/dL (ref 0.0–1.2)
BUN/Creatinine Ratio: 17 (ref 9–20)
BUN: 18 mg/dL (ref 6–24)
CALCIUM: 9.5 mg/dL (ref 8.7–10.2)
CHLORIDE: 99 mmol/L (ref 96–106)
CO2: 21 mmol/L (ref 20–29)
Creatinine, Ser: 1.08 mg/dL (ref 0.76–1.27)
GFR calc Af Amer: 86 mL/min/{1.73_m2} (ref >59)
GFR, EST NON AFRICAN AMERICAN: 75 mL/min/{1.73_m2} (ref >59)
GLOBULIN, TOTAL: 2.4 g/dL (ref 1.5–4.5)
Glucose: 165 mg/dL — ABNORMAL HIGH (ref 65–99)
POTASSIUM: 4.7 mmol/L (ref 3.5–5.2)
SODIUM: 138 mmol/L (ref 134–144)
Total Protein: 7.1 g/dL (ref 6.0–8.5)

## 2016-12-29 LAB — LIPID PANEL
CHOL/HDL RATIO: 2.7 ratio (ref 0.0–5.0)
Cholesterol, Total: 111 mg/dL (ref 100–199)
HDL: 41 mg/dL (ref >39)
LDL Calculated: 42 mg/dL (ref 0–99)
TRIGLYCERIDES: 138 mg/dL (ref 0–149)
VLDL Cholesterol Cal: 28 mg/dL (ref 5–40)

## 2016-12-29 LAB — CBC WITH DIFFERENTIAL/PLATELET
BASOS ABS: 0 10*3/uL (ref 0.0–0.2)
Basos: 0 %
EOS (ABSOLUTE): 0.3 10*3/uL (ref 0.0–0.4)
Eos: 3 %
HEMATOCRIT: 49.3 % (ref 37.5–51.0)
Hemoglobin: 16.8 g/dL (ref 13.0–17.7)
Immature Grans (Abs): 0 10*3/uL (ref 0.0–0.1)
Immature Granulocytes: 0 %
LYMPHS ABS: 1.9 10*3/uL (ref 0.7–3.1)
Lymphs: 25 %
MCH: 30.4 pg (ref 26.6–33.0)
MCHC: 34.1 g/dL (ref 31.5–35.7)
MCV: 89 fL (ref 79–97)
MONOS ABS: 0.5 10*3/uL (ref 0.1–0.9)
Monocytes: 7 %
NEUTROS ABS: 4.8 10*3/uL (ref 1.4–7.0)
Neutrophils: 65 %
PLATELETS: 191 10*3/uL (ref 150–379)
RBC: 5.52 x10E6/uL (ref 4.14–5.80)
RDW: 13.7 % (ref 12.3–15.4)
WBC: 7.4 10*3/uL (ref 3.4–10.8)

## 2016-12-29 LAB — TESTOSTERONE,FREE AND TOTAL
Testosterone, Free: 14.5 pg/mL (ref 7.2–24.0)
Testosterone: 243 ng/dL — ABNORMAL LOW (ref 264–916)

## 2017-01-12 DIAGNOSIS — E291 Testicular hypofunction: Secondary | ICD-10-CM | POA: Diagnosis not present

## 2017-01-12 DIAGNOSIS — Z719 Counseling, unspecified: Secondary | ICD-10-CM | POA: Diagnosis not present

## 2017-01-12 DIAGNOSIS — E669 Obesity, unspecified: Secondary | ICD-10-CM | POA: Diagnosis not present

## 2017-01-12 DIAGNOSIS — I10 Essential (primary) hypertension: Secondary | ICD-10-CM | POA: Diagnosis not present

## 2017-01-12 DIAGNOSIS — Z008 Encounter for other general examination: Secondary | ICD-10-CM | POA: Diagnosis not present

## 2017-01-12 DIAGNOSIS — Z6841 Body Mass Index (BMI) 40.0 and over, adult: Secondary | ICD-10-CM | POA: Diagnosis not present

## 2017-01-12 DIAGNOSIS — E119 Type 2 diabetes mellitus without complications: Secondary | ICD-10-CM | POA: Diagnosis not present

## 2017-01-14 ENCOUNTER — Ambulatory Visit: Payer: BLUE CROSS/BLUE SHIELD | Admitting: Family Medicine

## 2017-01-14 ENCOUNTER — Encounter: Payer: Self-pay | Admitting: Family Medicine

## 2017-01-14 VITALS — BP 126/78 | HR 70 | Temp 97.7°F | Ht 72.0 in | Wt 295.0 lb

## 2017-01-14 DIAGNOSIS — E1121 Type 2 diabetes mellitus with diabetic nephropathy: Secondary | ICD-10-CM

## 2017-01-14 DIAGNOSIS — Z1211 Encounter for screening for malignant neoplasm of colon: Secondary | ICD-10-CM | POA: Diagnosis not present

## 2017-01-14 DIAGNOSIS — Z125 Encounter for screening for malignant neoplasm of prostate: Secondary | ICD-10-CM

## 2017-01-14 DIAGNOSIS — I10 Essential (primary) hypertension: Secondary | ICD-10-CM

## 2017-01-14 DIAGNOSIS — Z1159 Encounter for screening for other viral diseases: Secondary | ICD-10-CM | POA: Diagnosis not present

## 2017-01-14 DIAGNOSIS — Z794 Long term (current) use of insulin: Secondary | ICD-10-CM

## 2017-01-14 DIAGNOSIS — E291 Testicular hypofunction: Secondary | ICD-10-CM | POA: Diagnosis not present

## 2017-01-14 DIAGNOSIS — Z23 Encounter for immunization: Secondary | ICD-10-CM

## 2017-01-14 MED ORDER — SITAGLIPTIN PHOS-METFORMIN HCL 50-1000 MG PO TABS: 1.0000 | ORAL_TABLET | Freq: Two times a day (BID) | ORAL | 3 refills | Status: DC

## 2017-01-14 MED ORDER — ROSUVASTATIN CALCIUM 10 MG PO TABS: 10.0000 mg | ORAL_TABLET | Freq: Every day | ORAL | 1 refills | Status: DC

## 2017-01-14 MED ORDER — METOPROLOL SUCCINATE ER 25 MG PO TB24: 25.0000 mg | ORAL_TABLET | Freq: Every day | ORAL | 1 refills | Status: DC

## 2017-01-14 MED ORDER — GLIPIZIDE 5 MG PO TABS: 5.0000 mg | ORAL_TABLET | Freq: Every morning | ORAL | 1 refills | Status: DC

## 2017-01-14 MED ORDER — CANAGLIFLOZIN 300 MG PO TABS: ORAL_TABLET | ORAL | 1 refills | Status: DC

## 2017-01-14 MED ORDER — AMLODIPINE BESYLATE 10 MG PO TABS: 10.0000 mg | ORAL_TABLET | Freq: Every day | ORAL | 1 refills | Status: DC

## 2017-01-14 MED ORDER — VALSARTAN-HYDROCHLOROTHIAZIDE 320-25 MG PO TABS: 1.0000 | ORAL_TABLET | Freq: Every day | ORAL | 1 refills | Status: DC

## 2017-01-14 NOTE — Progress Notes (Signed)
Subjective:  Patient ID: Eugene Bautista, male    DOB: [birth year]  Age: 61 y.o. MRN: [id number]  CC: Diabetes (pt here today for routine follow up of his chronic medical conditions, no other concerns voiced.)   HPI Eugene ReadyWilliam Clay Bautista presents for follow-up of diabetes. Patient does not check blood sugar at home Patient denies symptoms such as polyuria, polydipsia, excessive hunger, nausea No significant hypoglycemic spells noted. Medications as noted below. Taking them regularly without complication/adverse reaction being reported today.  Follow up for testosterone deficiency: Pt. Using medication as directed. Denies any sx referrable to DVT such as edema or erythema of legs. No dyspnea or chest pain. Energy level reported as being good. Libido is normal and denies E.D. Feels strength is adequate and improved from baseline.   Follow-up of hypertension. Patient has no history of headache chest pain or shortness of breath or recent cough. Patient also denies symptoms of TIA such as numbness weakness lateralizing. Patient checks  blood pressure at home and has not had any elevated readings recently. Patient denies side effects from his medication. States taking it regularly.  Of note is that he does have stage III renal insufficiency as well.  He denies oliguria.    [facility] 01/14/2017 08/18/2016 05/19/2016  Decreased Interest 0 0 0  Down, Depressed, Hopeless 0 0 0  PHQ - 2 Score 0 0 0    History Eugene Bautista has a past medical history of Carotid artery occlusion, Diabetes mellitus without complication (HCC), Hyperlipidemia, Hypertension, Left carotid bruit, and Obesity.   He has a past surgical history that includes Ruptured Aorta Repair (1989); Urinary surgery (1989); and Fracture surgery (Left, 1989).   His family history includes Aneurysm (age of onset: 8059) in his sister; Cancer in his brother; Dementia in his mother; Diabetes in his father; Healthy in his daughter;  Heart attack (age of onset: 7380) in his father; Heart disease in his father; Kidney disease in his father.He reports that he quit smoking about 15 years ago. His smoking use included cigarettes. He has a 43.75 pack-year smoking history. he has never used smokeless tobacco. He reports that he drinks about 3.6 oz of alcohol per week. He reports that he does not use drugs.    ROS Review of Systems  Constitutional: Negative for chills, diaphoresis, fever and unexpected weight change.  HENT: Negative for congestion, hearing loss, rhinorrhea and sore throat.   Eyes: Negative for visual disturbance.  Respiratory: Negative for cough and shortness of breath.   Cardiovascular: Negative for chest pain.  Gastrointestinal: Negative for abdominal pain, constipation and diarrhea.  Genitourinary: Negative for dysuria and flank pain.  Musculoskeletal: Negative for arthralgias and joint swelling.  Skin: Negative for rash.  Neurological: Negative for dizziness and headaches.  Psychiatric/Behavioral: Negative for dysphoric mood and sleep disturbance.    Objective:  BP 126/78   Pulse 70   Temp 97.7 F (36.5 C) (Oral)   Ht 6' (1.829 m)   Wt 295 lb (133.8 kg)   BMI 40.01 kg/m   BP Readings from Last 3 Encounters:  01/14/17 126/78  08/18/16 124/70  05/19/16 127/68    Wt Readings from Last 3 Encounters:  01/14/17 295 lb (133.8 kg)  08/18/16 292 lb (132.5 kg)  05/19/16 294 lb (133.4 kg)     Physical Exam  Constitutional: He is oriented to person, place, and time. He appears well-developed and well-nourished. No distress.  HENT:  Head: Normocephalic and atraumatic.  Right Ear: External ear  normal.  Left Ear: External ear normal.  Nose: Nose normal.  Mouth/Throat: Oropharynx is clear and moist.  Eyes: Conjunctivae and EOM are normal. Pupils are equal, round, and reactive to light.  Neck: Normal range of motion. Neck supple. No thyromegaly present.  Cardiovascular: Normal rate, regular rhythm  and normal heart sounds.  No murmur heard. Pulmonary/Chest: Effort normal and breath sounds normal. No respiratory distress. He has no wheezes. He has no rales.  Abdominal: Soft. Bowel sounds are normal. He exhibits no distension. There is no tenderness.  Lymphadenopathy:    He has no cervical adenopathy.  Neurological: He is alert and oriented to person, place, and time. He has normal reflexes.  Skin: Skin is warm and dry.  Psychiatric: He has a normal mood and affect. His behavior is normal. Judgment and thought content normal.      Assessment & Plan:   Eugene Bautista was seen today for diabetes.  Diagnoses and all orders for this visit:  Essential hypertension  Hypogonadism, testicular -     Testosterone,Free and Total  Need for hepatitis C screening test -     Hepatitis C antibody  Encounter for screening colonoscopy -     Ambulatory referral to Gastroenterology  Special screening for malignant neoplasm of prostate -     PSA, total and free  Need for immunization against influenza -     Flu Vaccine QUAD 36+ mos IM  Type 2 diabetes mellitus with diabetic nephropathy, with long-term current use of insulin (HCC)  Other orders -     amLODipine (NORVASC) 10 MG tablet; Take 1 tablet (10 mg total) daily by mouth. -     glipiZIDE (GLUCOTROL) 5 MG tablet; Take 1 tablet (5 mg total) every morning by mouth. -     canagliflozin (INVOKANA) 300 MG TABS tablet; TAKE 1 TABLET BY MOUTH  DAILY BEFORE BREAKFAST -     metoprolol succinate (TOPROL-XL) 25 MG 24 hr tablet; Take 1 tablet (25 mg total) daily by mouth. -     rosuvastatin (CRESTOR) 10 MG tablet; Take 1 tablet (10 mg total) at bedtime by mouth. -     sitaGLIPtin-metformin (JANUMET) 50-1000 MG tablet; Take 1 tablet 2 (two) times daily by mouth. -     valsartan-hydrochlorothiazide (DIOVAN-HCT) 320-25 MG tablet; Take 1 tablet daily by mouth.       I have discontinued Kristine GarbeWilliam C. Hofferber's Testosterone and  ciprofloxacin-hydrocortisone. I have changed his INVOKANA to canagliflozin. I have also changed his amLODipine, glipiZIDE, metoprolol succinate, rosuvastatin, sitaGLIPtin-metformin, and valsartan-hydrochlorothiazide. Additionally, I am having him maintain his aspirin, fish oil-omega-3 fatty acids, and cholecalciferol.  Allergies as of 01/14/2017      Reactions   Atorvastatin Other (See Comments)   Myalgias- Shoulder's Hurt      Medication List        Accurate as of 01/14/17  4:13 PM. Always use your most recent med list.          amLODipine 10 MG tablet Commonly known as:  NORVASC Take 1 tablet (10 mg total) daily by mouth.   aspirin 81 MG tablet Take 81 mg by mouth daily.   canagliflozin 300 MG Tabs tablet Commonly known as:  INVOKANA TAKE 1 TABLET BY MOUTH  DAILY BEFORE BREAKFAST   cholecalciferol 1000 units tablet Commonly known as:  VITAMIN D Take 2,000 Units by mouth daily.   fish oil-omega-3 fatty acids 1000 MG capsule Take 1 g by mouth 2 (two) times daily.   glipiZIDE 5 MG tablet  Commonly known as:  GLUCOTROL Take 1 tablet (5 mg total) every morning by mouth.   metoprolol succinate 25 MG 24 hr tablet Commonly known as:  TOPROL-XL Take 1 tablet (25 mg total) daily by mouth.   rosuvastatin 10 MG tablet Commonly known as:  CRESTOR Take 1 tablet (10 mg total) at bedtime by mouth.   sitaGLIPtin-metformin 50-1000 MG tablet Commonly known as:  JANUMET Take 1 tablet 2 (two) times daily by mouth.   valsartan-hydrochlorothiazide 320-25 MG tablet Commonly known as:  DIOVAN-HCT Take 1 tablet daily by mouth.        Follow-up: Return in about 3 months (around 04/16/2017).  Mechele Claude, M.D.

## 2017-01-15 LAB — HEPATITIS C ANTIBODY: Hep C Virus Ab: <0.1 s/co ratio (ref 0.0–0.9)

## 2017-01-15 LAB — PSA, TOTAL AND FREE
PSA FREE PCT: 32 %
PSA FREE: 0.16 ng/mL
Prostate Specific Ag, Serum: 0.5 ng/mL (ref 0.0–4.0)

## 2017-01-15 LAB — TESTOSTERONE,FREE AND TOTAL
TESTOSTERONE FREE: 10.5 pg/mL (ref 7.2–24.0)
Testosterone: 245 ng/dL — ABNORMAL LOW (ref 264–916)

## 2017-01-17 ENCOUNTER — Telehealth: Payer: Self-pay | Admitting: Family Medicine

## 2017-01-17 ENCOUNTER — Encounter (INDEPENDENT_AMBULATORY_CARE_PROVIDER_SITE_OTHER): Payer: Self-pay | Admitting: *Deleted

## 2017-01-17 ENCOUNTER — Other Ambulatory Visit: Payer: Self-pay | Admitting: Family Medicine

## 2017-01-17 MED ORDER — ANDROGEL 20.25 MG/1.25GM (1.62%) TD GEL: TRANSDERMAL | 5 refills | Status: DC

## 2017-01-17 NOTE — Telephone Encounter (Signed)
Patient notified of lab results. Would like to start with gel and see how that work. Sent message to Dr Darlyn ReadStacks to prescribe gel.

## 2017-03-22 ENCOUNTER — Encounter: Payer: Self-pay | Admitting: *Deleted

## 2017-04-02 ENCOUNTER — Telehealth: Payer: Self-pay | Admitting: Family Medicine

## 2017-04-04 ENCOUNTER — Other Ambulatory Visit: Payer: Self-pay | Admitting: Family Medicine

## 2017-04-04 DIAGNOSIS — E1121 Type 2 diabetes mellitus with diabetic nephropathy: Secondary | ICD-10-CM

## 2017-04-04 DIAGNOSIS — E1169 Type 2 diabetes mellitus with other specified complication: Secondary | ICD-10-CM

## 2017-04-04 DIAGNOSIS — Z794 Long term (current) use of insulin: Secondary | ICD-10-CM

## 2017-04-04 DIAGNOSIS — E785 Hyperlipidemia, unspecified: Principal | ICD-10-CM

## 2017-04-04 DIAGNOSIS — E291 Testicular hypofunction: Secondary | ICD-10-CM

## 2017-04-04 NOTE — Telephone Encounter (Signed)
Left deatiled mssg on VM

## 2017-04-04 NOTE — Telephone Encounter (Signed)
I put in the labs that I think will be best for him.  He can come in any time but I prefer before 10 AM and fasting.

## 2017-04-09 ENCOUNTER — Other Ambulatory Visit: Payer: Self-pay | Admitting: Family Medicine

## 2017-04-18 ENCOUNTER — Ambulatory Visit: Payer: BLUE CROSS/BLUE SHIELD | Admitting: Family Medicine

## 2017-04-18 DIAGNOSIS — Z719 Counseling, unspecified: Secondary | ICD-10-CM | POA: Diagnosis not present

## 2017-04-18 DIAGNOSIS — Z6841 Body Mass Index (BMI) 40.0 and over, adult: Secondary | ICD-10-CM | POA: Diagnosis not present

## 2017-04-18 DIAGNOSIS — E291 Testicular hypofunction: Secondary | ICD-10-CM | POA: Diagnosis not present

## 2017-04-18 DIAGNOSIS — E669 Obesity, unspecified: Secondary | ICD-10-CM | POA: Diagnosis not present

## 2017-04-20 ENCOUNTER — Ambulatory Visit: Payer: BLUE CROSS/BLUE SHIELD | Admitting: Family Medicine

## 2017-04-20 ENCOUNTER — Encounter: Payer: Self-pay | Admitting: Family Medicine

## 2017-04-20 VITALS — BP 131/73 | HR 73 | Temp 97.4°F | Ht 72.0 in | Wt 297.0 lb

## 2017-04-20 DIAGNOSIS — E1169 Type 2 diabetes mellitus with other specified complication: Secondary | ICD-10-CM

## 2017-04-20 DIAGNOSIS — E66813 Obesity, class 3: Secondary | ICD-10-CM

## 2017-04-20 DIAGNOSIS — Z6841 Body Mass Index (BMI) 40.0 and over, adult: Secondary | ICD-10-CM

## 2017-04-20 DIAGNOSIS — N182 Chronic kidney disease, stage 2 (mild): Secondary | ICD-10-CM

## 2017-04-20 DIAGNOSIS — E1121 Type 2 diabetes mellitus with diabetic nephropathy: Secondary | ICD-10-CM | POA: Diagnosis not present

## 2017-04-20 DIAGNOSIS — I10 Essential (primary) hypertension: Secondary | ICD-10-CM

## 2017-04-20 DIAGNOSIS — Z794 Long term (current) use of insulin: Secondary | ICD-10-CM

## 2017-04-20 DIAGNOSIS — E785 Hyperlipidemia, unspecified: Secondary | ICD-10-CM

## 2017-04-20 DIAGNOSIS — E291 Testicular hypofunction: Secondary | ICD-10-CM | POA: Diagnosis not present

## 2017-04-20 LAB — URINALYSIS
Bilirubin, UA: NEGATIVE
Ketones, UA: NEGATIVE
LEUKOCYTES UA: NEGATIVE
Nitrite, UA: NEGATIVE
PH UA: 5.5 (ref 5.0–7.5)
PROTEIN UA: NEGATIVE
RBC UA: NEGATIVE
Specific Gravity, UA: 1.015 (ref 1.005–1.030)
Urobilinogen, Ur: 0.2 mg/dL (ref 0.2–1.0)

## 2017-04-20 LAB — BAYER DCA HB A1C WAIVED: HB A1C (BAYER DCA - WAIVED): 6.8 % (ref <7.0)

## 2017-04-20 MED ORDER — PHENTERMINE HCL 37.5 MG PO CAPS: 37.5000 mg | ORAL_CAPSULE | ORAL | 0 refills | Status: DC

## 2017-04-20 MED ORDER — PHENTERMINE HCL 37.5 MG PO CAPS: 37.5000 mg | ORAL_CAPSULE | ORAL | 2 refills | Status: DC

## 2017-04-20 NOTE — Progress Notes (Signed)
Subjective:  Patient ID: Eugene Bautista,  male    DOB: 04/14/1955  Age: 62 y.o.    CC: Diabetes (pt here today for routine follow up of his chronic medical conditions, no other concerns voiced)   HPI Nachum Derossett presents for  follow-up of hypertension. Patient has no history of headache chest pain or shortness of breath or recent cough. Patient also denies symptoms of TIA such as numbness weakness lateralizing.  Patient denies side effects from medication. States taking it regularly.  Patient also  in for follow-up of elevated cholesterol. Doing well without complaints on current medication. Denies side effects of statin including myalgia and arthralgia and nausea. Also in today for liver function testing. Currently no chest pain, shortness of breath or other cardiovascular related symptoms noted.  Follow-up of diabetes. Patient does check blood sugar at home. Readings run between 100 and 160 fasting and Postprandial Patient denies symptoms such as polyuria, polydipsia, excessive hunger, nausea No significant hypoglycemic spells noted. Medications reviewed. Pt reports taking them regularly. Pt. denies complication/adverse reaction today.    Follow up for testosterone deficiency: Pt. Using medication as directed. Denies any sx referrable to DVT such as edema or erythema of legs. No dyspnea or chest pain. Energy level reported as being good. Libido is normal and denies E.D. Feels strength is adequate and improved from baseline.   History Jaelan has a past medical history of Carotid artery occlusion, Diabetes mellitus without complication ([place]), Hyperlipidemia, Hypertension, Left carotid bruit, and Obesity.   He has a past surgical history that includes Ruptured Aorta Repair (1989); Urinary surgery (1989); and Fracture surgery (Left, 1989).   His family history includes Aneurysm (age of onset: 75) in his sister; Cancer in his brother; Dementia in his mother; Diabetes in his  father; Healthy in his daughter; Heart attack (age of onset: 89) in his father; Heart disease in his father; Kidney disease in his father.He reports that he quit smoking about 16 years ago. His smoking use included cigarettes. He has a 43.75 pack-year smoking history. he has never used smokeless tobacco. He reports that he drinks about 3.6 oz of alcohol per week. He reports that he does not use drugs.  Current Outpatient Medications on File Prior to Visit  Medication Sig Dispense Refill  . amLODipine (NORVASC) 10 MG tablet Take 1 tablet (10 mg total) daily by mouth. 90 tablet 1  . ANDROGEL 20.25 MG/1.25GM (1.62%) GEL Apply 2 pumps to the upper chest and shoulders daily for testosterone supplement. 150 g 5  . aspirin 81 MG tablet Take 81 mg by mouth daily.    . canagliflozin (INVOKANA) 300 MG TABS tablet TAKE 1 TABLET BY MOUTH  DAILY BEFORE BREAKFAST 90 tablet 1  . cholecalciferol (VITAMIN D) 1000 UNITS tablet Take 2,000 Units by mouth daily.    . fish oil-omega-3 fatty acids 1000 MG capsule Take 1 g by mouth 2 (two) times daily.    Marland Kitchen glipiZIDE (GLUCOTROL) 5 MG tablet Take 1 tablet (5 mg total) every morning by mouth. 90 tablet 1  . metoprolol succinate (TOPROL-XL) 25 MG 24 hr tablet Take 1 tablet (25 mg total) daily by mouth. 90 tablet 1  . rosuvastatin (CRESTOR) 10 MG tablet Take 1 tablet (10 mg total) at bedtime by mouth. 90 tablet 1  . sitaGLIPtin-metformin (JANUMET) 50-1000 MG tablet Take 1 tablet 2 (two) times daily by mouth. 180 tablet 3  . valsartan-hydrochlorothiazide (DIOVAN-HCT) 320-25 MG tablet Take 1 tablet daily by mouth. [place]  tablet 1   No current facility-administered medications on file prior to visit.     ROS Review of Systems  Constitutional: Negative for chills, diaphoresis, fever and unexpected weight change.  HENT: Negative for congestion, hearing loss, rhinorrhea and sore throat.   Eyes: Negative for visual disturbance.  Respiratory: Negative for cough and shortness of  breath.   Cardiovascular: Negative for chest pain.  Gastrointestinal: Negative for abdominal pain, constipation and diarrhea.  Genitourinary: Negative for dysuria and flank pain.  Musculoskeletal: Negative for arthralgias and joint swelling.  Skin: Negative for rash.  Neurological: Negative for dizziness and headaches.  Psychiatric/Behavioral: Negative for dysphoric mood and sleep disturbance.    Objective:  BP 131/73   Pulse 73   Temp (!) 97.4 F (36.3 C) (Oral)   Ht 6' (1.829 m)   Wt 297 lb (134.7 kg)   BMI 40.28 kg/m   BP Readings from Last 3 Encounters:  04/20/17 131/73  01/14/17 126/78  08/18/16 124/70    Wt Readings from Last 3 Encounters:  04/20/17 297 lb (134.7 kg)  01/14/17 295 lb (133.8 kg)  08/18/16 292 lb (132.5 kg)     Physical Exam  Constitutional: He is oriented to person, place, and time. He appears well-developed and well-nourished. No distress.  HENT:  Head: Normocephalic and atraumatic.  Right Ear: External ear normal.  Left Ear: External ear normal.  Nose: Nose normal.  Mouth/Throat: Oropharynx is clear and moist.  Eyes: Conjunctivae and EOM are normal. Pupils are equal, round, and reactive to light.  Neck: Normal range of motion. Neck supple. No thyromegaly present.  Cardiovascular: Normal rate, regular rhythm and normal heart sounds.  No murmur heard. Pulmonary/Chest: Effort normal and breath sounds normal. No respiratory distress. He has no wheezes. He has no rales.  Abdominal: Soft. Bowel sounds are normal. He exhibits no distension. There is no tenderness.  Lymphadenopathy:    He has no cervical adenopathy.  Neurological: He is alert and oriented to person, place, and time. He has normal reflexes.  Skin: Skin is warm and dry.  Psychiatric: He has a normal mood and affect. His behavior is normal. Judgment and thought content normal.    Assessment & Plan:   Jadarious was seen today for diabetes.  Diagnoses and all orders for this  visit:  Essential hypertension  Hypogonadism, testicular -     Testosterone,Free and Total  Type 2 diabetes mellitus with diabetic nephropathy, with long-term current use of insulin ([place]) -     Cancel: Microalbumin / creatinine urine ratio -     Cancel: Urinalysis -     Lipid panel -     CMP14+EGFR -     CBC with Differential/Platelet -     Bayer DCA Hb A1c Waived -     Microalbumin / creatinine urine ratio -     Urinalysis  Dyslipidemia associated with type 2 diabetes mellitus (HCC) -     Lipid panel  Chronic renal impairment, stage 2 (mild)  Class 3 severe obesity due to excess calories with serious comorbidity and body mass index (BMI) of 40.0 to 44.9 in adult [facility])  Other orders -     Discontinue: phentermine 37.5 MG capsule; Take 1 capsule (37.5 mg total) by mouth every morning. -     phentermine 37.5 MG capsule; Take 1 capsule (37.5 mg total) by mouth every morning.   I have discontinued Luanna Cole. Olivier's cyclobenzaprine and predniSONE. I am also having him maintain his aspirin, fish oil-omega-3 fatty  acids, cholecalciferol, amLODipine, glipiZIDE, canagliflozin, metoprolol succinate, rosuvastatin, sitaGLIPtin-metformin, valsartan-hydrochlorothiazide, ANDROGEL, and phentermine.  Meds ordered this encounter  Medications  . DISCONTD: phentermine 37.5 MG capsule    Sig: Take 1 capsule (37.5 mg total) by mouth every morning.    Dispense:  30 capsule    Refill:  2  . phentermine 37.5 MG capsule    Sig: Take 1 capsule (37.5 mg total) by mouth every morning.    Dispense:  90 capsule    Refill:  0     Follow-up: Return in about 3 months (around 07/18/2017).  Claretta Fraise, M.D.

## 2017-04-20 NOTE — Patient Instructions (Signed)
Please call [facility] [phone] to schedule your colonoscopy

## 2017-04-21 LAB — CMP14+EGFR
A/G RATIO: 1.6 (ref 1.2–2.2)
ALT: 23 IU/L (ref 0–44)
AST: 15 IU/L (ref 0–40)
Albumin: 4.5 g/dL (ref 3.6–4.8)
Alkaline Phosphatase: 73 IU/L (ref 39–117)
BILIRUBIN TOTAL: 0.6 mg/dL (ref 0.0–1.2)
BUN / CREAT RATIO: 18 (ref 10–24)
BUN: 24 mg/dL (ref 8–27)
CHLORIDE: 98 mmol/L (ref 96–106)
CO2: 20 mmol/L (ref 20–29)
Calcium: 9.9 mg/dL (ref 8.6–10.2)
Creatinine, Ser: 1.32 mg/dL — ABNORMAL HIGH (ref 0.76–1.27)
GFR calc Af Amer: 67 mL/min/{1.73_m2} (ref >59)
GFR calc non Af Amer: 58 mL/min/{1.73_m2} — ABNORMAL LOW (ref >59)
GLOBULIN, TOTAL: 2.9 g/dL (ref 1.5–4.5)
Glucose: 168 mg/dL — ABNORMAL HIGH (ref 65–99)
Potassium: 4.9 mmol/L (ref 3.5–5.2)
SODIUM: 139 mmol/L (ref 134–144)
Total Protein: 7.4 g/dL (ref 6.0–8.5)

## 2017-04-21 LAB — CBC WITH DIFFERENTIAL/PLATELET
BASOS ABS: 0 10*3/uL (ref 0.0–0.2)
Basos: 0 %
EOS (ABSOLUTE): 0.4 10*3/uL (ref 0.0–0.4)
EOS: 4 %
HEMATOCRIT: 52.8 % — AB (ref 37.5–51.0)
Hemoglobin: 18.5 g/dL — ABNORMAL HIGH (ref 13.0–17.7)
Immature Grans (Abs): 0 10*3/uL (ref 0.0–0.1)
Immature Granulocytes: 1 %
LYMPHS ABS: 2 10*3/uL (ref 0.7–3.1)
Lymphs: 23 %
MCH: 31.2 pg (ref 26.6–33.0)
MCHC: 35 g/dL (ref 31.5–35.7)
MCV: 89 fL (ref 79–97)
MONOS ABS: 0.8 10*3/uL (ref 0.1–0.9)
Monocytes: 9 %
Neutrophils Absolute: 5.5 10*3/uL (ref 1.4–7.0)
Neutrophils: 63 %
Platelets: 193 10*3/uL (ref 150–379)
RBC: 5.93 x10E6/uL — ABNORMAL HIGH (ref 4.14–5.80)
RDW: 14 % (ref 12.3–15.4)
WBC: 8.7 10*3/uL (ref 3.4–10.8)

## 2017-04-21 LAB — TESTOSTERONE,FREE AND TOTAL
TESTOSTERONE: 418 ng/dL (ref 264–916)
Testosterone, Free: 18.4 pg/mL — ABNORMAL HIGH (ref 6.6–18.1)

## 2017-04-21 LAB — MICROALBUMIN / CREATININE URINE RATIO
CREATININE, UR: 81.6 mg/dL
MICROALBUM., U, RANDOM: 11.2 ug/mL
Microalb/Creat Ratio: 13.7 mg/g creat (ref 0.0–30.0)

## 2017-04-21 LAB — LIPID PANEL
CHOLESTEROL TOTAL: 118 mg/dL (ref 100–199)
Chol/HDL Ratio: 3 ratio (ref 0.0–5.0)
HDL: 40 mg/dL (ref >39)
LDL CALC: 34 mg/dL (ref 0–99)
TRIGLYCERIDES: 218 mg/dL — AB (ref 0–149)
VLDL CHOLESTEROL CAL: 44 mg/dL — AB (ref 5–40)

## 2017-06-02 ENCOUNTER — Other Ambulatory Visit: Payer: Self-pay | Admitting: Family Medicine

## 2017-06-06 ENCOUNTER — Encounter (HOSPITAL_COMMUNITY): Payer: BLUE CROSS/BLUE SHIELD

## 2017-06-06 ENCOUNTER — Ambulatory Visit: Payer: BLUE CROSS/BLUE SHIELD | Admitting: Family

## 2017-07-04 ENCOUNTER — Other Ambulatory Visit: Payer: Self-pay | Admitting: *Deleted

## 2017-07-04 ENCOUNTER — Other Ambulatory Visit: Payer: Self-pay | Admitting: Family Medicine

## 2017-07-04 MED ORDER — ANDROGEL 20.25 MG/1.25GM (1.62%) TD GEL: TRANSDERMAL | 5 refills | Status: DC

## 2017-07-13 ENCOUNTER — Other Ambulatory Visit: Payer: Self-pay | Admitting: *Deleted

## 2017-07-13 MED ORDER — ANDROGEL 20.25 MG/1.25GM (1.62%) TD GEL: TRANSDERMAL | 5 refills | Status: DC

## 2017-07-13 NOTE — Telephone Encounter (Signed)
VM received from [facility] to refill Androgel When request was received on 07/04/17 this was received by both [facility]. [facility] not [facility] TC to patient, he needs refill to be sent to [facility] due to cost

## 2017-07-18 DIAGNOSIS — Z719 Counseling, unspecified: Secondary | ICD-10-CM | POA: Diagnosis not present

## 2017-07-18 DIAGNOSIS — Z008 Encounter for other general examination: Secondary | ICD-10-CM | POA: Diagnosis not present

## 2017-08-03 ENCOUNTER — Ambulatory Visit: Payer: BLUE CROSS/BLUE SHIELD | Admitting: Family Medicine

## 2017-08-04 ENCOUNTER — Telehealth: Payer: Self-pay | Admitting: Family Medicine

## 2017-08-04 ENCOUNTER — Other Ambulatory Visit: Payer: Self-pay | Admitting: *Deleted

## 2017-08-04 DIAGNOSIS — E291 Testicular hypofunction: Secondary | ICD-10-CM

## 2017-08-04 DIAGNOSIS — E785 Hyperlipidemia, unspecified: Principal | ICD-10-CM

## 2017-08-04 DIAGNOSIS — E1169 Type 2 diabetes mellitus with other specified complication: Secondary | ICD-10-CM

## 2017-08-04 NOTE — Telephone Encounter (Signed)
Future lab orders placed and pt is aware.

## 2017-08-06 ENCOUNTER — Other Ambulatory Visit: Payer: BLUE CROSS/BLUE SHIELD

## 2017-08-06 DIAGNOSIS — E1169 Type 2 diabetes mellitus with other specified complication: Secondary | ICD-10-CM | POA: Diagnosis not present

## 2017-08-06 DIAGNOSIS — E291 Testicular hypofunction: Secondary | ICD-10-CM

## 2017-08-06 DIAGNOSIS — E785 Hyperlipidemia, unspecified: Principal | ICD-10-CM

## 2017-08-07 LAB — LIPID PANEL
CHOL/HDL RATIO: 2.4 ratio (ref 0.0–5.0)
Cholesterol, Total: 88 mg/dL — ABNORMAL LOW (ref 100–199)
HDL: 36 mg/dL — ABNORMAL LOW (ref >39)
LDL Calculated: 27 mg/dL (ref 0–99)
TRIGLYCERIDES: 125 mg/dL (ref 0–149)
VLDL Cholesterol Cal: 25 mg/dL (ref 5–40)

## 2017-08-07 LAB — CBC WITH DIFFERENTIAL/PLATELET
BASOS: 0 %
Basophils Absolute: 0 10*3/uL (ref 0.0–0.2)
EOS (ABSOLUTE): 0.3 10*3/uL (ref 0.0–0.4)
EOS: 4 %
HEMATOCRIT: 51.5 % — AB (ref 37.5–51.0)
HEMOGLOBIN: 18 g/dL — AB (ref 13.0–17.7)
IMMATURE GRANS (ABS): 0 10*3/uL (ref 0.0–0.1)
Immature Granulocytes: 0 %
LYMPHS: 21 %
Lymphocytes Absolute: 1.8 10*3/uL (ref 0.7–3.1)
MCH: 31.1 pg (ref 26.6–33.0)
MCHC: 35 g/dL (ref 31.5–35.7)
MCV: 89 fL (ref 79–97)
Monocytes Absolute: 0.6 10*3/uL (ref 0.1–0.9)
Monocytes: 7 %
NEUTROS ABS: 5.7 10*3/uL (ref 1.4–7.0)
Neutrophils: 68 %
PLATELETS: 194 10*3/uL (ref 150–450)
RBC: 5.78 x10E6/uL (ref 4.14–5.80)
RDW: 14.4 % (ref 12.3–15.4)
WBC: 8.5 10*3/uL (ref 3.4–10.8)

## 2017-08-07 LAB — CMP14+EGFR
A/G RATIO: 2.4 — AB (ref 1.2–2.2)
ALT: 20 IU/L (ref 0–44)
AST: 16 IU/L (ref 0–40)
Albumin: 4.6 g/dL (ref 3.6–4.8)
Alkaline Phosphatase: 64 IU/L (ref 39–117)
BUN/Creatinine Ratio: 21 (ref 10–24)
BUN: 29 mg/dL — ABNORMAL HIGH (ref 8–27)
Bilirubin Total: 0.8 mg/dL (ref 0.0–1.2)
CALCIUM: 9.7 mg/dL (ref 8.6–10.2)
CO2: 23 mmol/L (ref 20–29)
CREATININE: 1.36 mg/dL — AB (ref 0.76–1.27)
Chloride: 101 mmol/L (ref 96–106)
GFR, EST AFRICAN AMERICAN: 65 mL/min/{1.73_m2} (ref >59)
GFR, EST NON AFRICAN AMERICAN: 56 mL/min/{1.73_m2} — AB (ref >59)
Globulin, Total: 1.9 g/dL (ref 1.5–4.5)
Glucose: 167 mg/dL — ABNORMAL HIGH (ref 65–99)
POTASSIUM: 5.2 mmol/L (ref 3.5–5.2)
Sodium: 139 mmol/L (ref 134–144)
TOTAL PROTEIN: 6.5 g/dL (ref 6.0–8.5)

## 2017-08-07 LAB — TESTOSTERONE,FREE AND TOTAL
Testosterone, Free: 12 pg/mL (ref 6.6–18.1)
Testosterone: 222 ng/dL — ABNORMAL LOW (ref 264–916)

## 2017-08-08 LAB — BAYER DCA HB A1C WAIVED: HB A1C (BAYER DCA - WAIVED): 6.7 % (ref <7.0)

## 2017-08-12 ENCOUNTER — Encounter: Payer: Self-pay | Admitting: Family Medicine

## 2017-08-12 ENCOUNTER — Ambulatory Visit: Payer: BLUE CROSS/BLUE SHIELD | Admitting: Family Medicine

## 2017-08-12 VITALS — BP 111/54 | HR 72 | Temp 97.6°F | Ht 72.0 in | Wt 279.5 lb

## 2017-08-12 DIAGNOSIS — Z794 Long term (current) use of insulin: Secondary | ICD-10-CM | POA: Diagnosis not present

## 2017-08-12 DIAGNOSIS — E785 Hyperlipidemia, unspecified: Secondary | ICD-10-CM | POA: Diagnosis not present

## 2017-08-12 DIAGNOSIS — E1169 Type 2 diabetes mellitus with other specified complication: Secondary | ICD-10-CM | POA: Diagnosis not present

## 2017-08-12 DIAGNOSIS — I10 Essential (primary) hypertension: Secondary | ICD-10-CM | POA: Diagnosis not present

## 2017-08-12 DIAGNOSIS — E1121 Type 2 diabetes mellitus with diabetic nephropathy: Secondary | ICD-10-CM | POA: Diagnosis not present

## 2017-08-12 LAB — HM DIABETES EYE EXAM

## 2017-08-12 MED ORDER — CANAGLIFLOZIN 300 MG PO TABS: ORAL_TABLET | ORAL | 1 refills | Status: DC

## 2017-08-12 NOTE — Progress Notes (Signed)
Subjective:  Patient ID: Eugene Bautista,  male    DOB: [birth year]  Age: 62 y.o.    CC: Medical Management of Chronic Issues   HPI Eugene Bautista presents for  follow-up of hypertension. Patient has no history of headache chest pain or shortness of breath or recent cough. Patient also denies symptoms of TIA such as numbness weakness lateralizing. Patient denies side effects from medication. States taking it regularly.  Patient also  in for follow-up of elevated cholesterol. Doing well without complaints on current medication. Denies side effects  including myalgia and arthralgia and nausea. Also in today for liver function testing. Currently no chest pain, shortness of breath or other cardiovascular related symptoms noted.  Follow-up of diabetes. Patient does check blood sugar at home. Readings run between 120-175 and <100 Patient denies symptoms such as excessive hunger or urinary frequency, excessive hunger, nausea No significant hypoglycemic spells noted. Medications reviewed. Pt reports taking them regularly. Pt. denies complication/adverse reaction today.    History Eugene Bautista has a past medical history of Carotid artery occlusion, Diabetes mellitus without complication (HCC), Hyperlipidemia, Hypertension, Left carotid bruit, and Obesity.   He has a past surgical history that includes Ruptured Aorta Repair (1989); Urinary surgery (1989); and Fracture surgery (Left, 1989).   His family history includes Aneurysm (age of onset: 33) in his sister; Cancer in his brother; Dementia in his mother; Diabetes in his father; Healthy in his daughter; Heart attack (age of onset: 51) in his father; Heart disease in his father; Kidney disease in his father.He reports that he quit smoking about 16 years ago. His smoking use included cigarettes. He has a 43.75 pack-year smoking history. He has never used smokeless tobacco. He reports that he drinks about 3.6 oz of alcohol per week. He reports  that he does not use drugs.  Current Outpatient Medications on File Prior to Visit  Medication Sig Dispense Refill  . amLODipine (NORVASC) 10 MG tablet TAKE 1 TABLET BY MOUTH  DAILY 90 tablet 1  . ANDROGEL 20.25 MG/1.25GM (1.62%) GEL Apply 2 pumps to the upper chest and shoulders daily for testosterone supplement. 150 g 5  . aspirin 81 MG tablet Take 81 mg by mouth daily.    . cholecalciferol (VITAMIN D) 1000 UNITS tablet Take 2,000 Units by mouth daily.    . fish oil-omega-3 fatty acids 1000 MG capsule Take 1 g by mouth 2 (two) times daily.    Marland Kitchen glipiZIDE (GLUCOTROL) 5 MG tablet TAKE 1 TABLET BY MOUTH IN  THE MORNING 90 tablet 1  . metoprolol succinate (TOPROL-XL) 25 MG 24 hr tablet TAKE 1 TABLET BY MOUTH  DAILY 90 tablet 1  . rosuvastatin (CRESTOR) 10 MG tablet TAKE 1 TABLET BY MOUTH AT  BEDTIME 90 tablet 1  . sitaGLIPtin-metformin (JANUMET) 50-1000 MG tablet Take 1 tablet 2 (two) times daily by mouth. 180 tablet 3  . valsartan-hydrochlorothiazide (DIOVAN-HCT) 320-25 MG tablet TAKE 1 TABLET BY MOUTH  DAILY 90 tablet 1   No current facility-administered medications on file prior to visit.     ROS Review of Systems  Constitutional: Negative.   HENT: Negative.   Eyes: Negative for visual disturbance.  Respiratory: Negative for cough and shortness of breath.   Cardiovascular: Negative for chest pain and leg swelling.  Gastrointestinal: Negative for abdominal pain, diarrhea, nausea and vomiting.  Genitourinary: Negative for difficulty urinating.  Musculoskeletal: Negative for arthralgias and myalgias.  Skin: Negative for rash.  Neurological: Negative for headaches.  Psychiatric/Behavioral: Negative  for sleep disturbance.    Objective:  BP (!) 111/54   Pulse 72   Temp 97.6 F (36.4 C) (Oral)   Ht 6' (1.829 m)   Wt 279 lb 8 oz (126.8 kg)   BMI 37.91 kg/m   BP Readings from Last 3 Encounters:  08/12/17 (!) 111/54  04/20/17 131/73  01/14/17 126/78    Wt Readings from Last 3  Encounters:  08/12/17 279 lb 8 oz (126.8 kg)  04/20/17 297 lb (134.7 kg)  01/14/17 295 lb (133.8 kg)     Physical Exam  Constitutional: He is oriented to person, place, and time. He appears well-developed and well-nourished. No distress.  HENT:  Head: Normocephalic and atraumatic.  Right Ear: External ear normal.  Left Ear: External ear normal.  Nose: Nose normal.  Mouth/Throat: Oropharynx is clear and moist.  Eyes: Pupils are equal, round, and reactive to light. Conjunctivae and EOM are normal.  Neck: Normal range of motion. Neck supple. No thyromegaly present.  Cardiovascular: Normal rate, regular rhythm and normal heart sounds.  No murmur heard. Pulmonary/Chest: Effort normal and breath sounds normal. No respiratory distress. He has no wheezes. He has no rales.  Abdominal: Soft. Bowel sounds are normal. He exhibits no distension. There is no tenderness.  Lymphadenopathy:    He has no cervical adenopathy.  Neurological: He is alert and oriented to person, place, and time. He has normal reflexes.  Skin: Skin is warm and dry.  Psychiatric: He has a normal mood and affect. His behavior is normal. Judgment and thought content normal.    Diabetic Foot Exam - Simple   No data filed        Assessment & Plan:   Eugene Bautista was seen today for medical management of chronic issues.  Diagnoses and all orders for this visit:  Type 2 diabetes mellitus with diabetic nephropathy, with long-term current use of insulin (HCC)  Dyslipidemia associated with type 2 diabetes mellitus (HCC)  Essential hypertension  Other orders -     canagliflozin (INVOKANA) 300 MG TABS tablet; TAKE 1 TABLET BY MOUTH  DAILY BEFORE BREAKFAST   I have discontinued Eugene Bautista's phentermine. I am also having him maintain his aspirin, fish oil-omega-3 fatty acids, cholecalciferol, sitaGLIPtin-metformin, metoprolol succinate, glipiZIDE, amLODipine, valsartan-hydrochlorothiazide, rosuvastatin, ANDROGEL, and  canagliflozin.  Meds ordered this encounter  Medications  . canagliflozin (INVOKANA) 300 MG TABS tablet    Sig: TAKE 1 TABLET BY MOUTH  DAILY BEFORE BREAKFAST    Dispense:  90 tablet    Refill:  1     Follow-up: Return in about 3 months (around 11/12/2017).  Mechele ClaudeWarren Ahmod Gillespie, M.D.

## 2017-08-12 NOTE — Patient Instructions (Signed)

## 2017-08-16 ENCOUNTER — Other Ambulatory Visit: Payer: Self-pay | Admitting: *Deleted

## 2017-08-21 ENCOUNTER — Encounter: Payer: Self-pay | Admitting: Family Medicine

## 2017-08-22 DIAGNOSIS — Z713 Dietary counseling and surveillance: Secondary | ICD-10-CM | POA: Diagnosis not present

## 2017-08-22 DIAGNOSIS — Z7689 Persons encountering health services in other specified circumstances: Secondary | ICD-10-CM | POA: Diagnosis not present

## 2017-08-22 DIAGNOSIS — E669 Obesity, unspecified: Secondary | ICD-10-CM | POA: Diagnosis not present

## 2017-08-22 DIAGNOSIS — Z6839 Body mass index (BMI) 39.0-39.9, adult: Secondary | ICD-10-CM | POA: Diagnosis not present

## 2017-10-05 DIAGNOSIS — E291 Testicular hypofunction: Secondary | ICD-10-CM | POA: Diagnosis not present

## 2017-10-05 DIAGNOSIS — Z008 Encounter for other general examination: Secondary | ICD-10-CM | POA: Diagnosis not present

## 2017-10-05 DIAGNOSIS — E669 Obesity, unspecified: Secondary | ICD-10-CM | POA: Diagnosis not present

## 2017-10-05 DIAGNOSIS — Z719 Counseling, unspecified: Secondary | ICD-10-CM | POA: Diagnosis not present

## 2017-10-14 DIAGNOSIS — H40033 Anatomical narrow angle, bilateral: Secondary | ICD-10-CM | POA: Diagnosis not present

## 2017-10-14 DIAGNOSIS — E119 Type 2 diabetes mellitus without complications: Secondary | ICD-10-CM | POA: Diagnosis not present

## 2017-10-26 DIAGNOSIS — Z7689 Persons encountering health services in other specified circumstances: Secondary | ICD-10-CM | POA: Diagnosis not present

## 2017-10-26 DIAGNOSIS — E669 Obesity, unspecified: Secondary | ICD-10-CM | POA: Diagnosis not present

## 2017-11-11 ENCOUNTER — Other Ambulatory Visit: Payer: BLUE CROSS/BLUE SHIELD

## 2017-11-11 DIAGNOSIS — E785 Hyperlipidemia, unspecified: Secondary | ICD-10-CM

## 2017-11-11 DIAGNOSIS — E291 Testicular hypofunction: Secondary | ICD-10-CM

## 2017-11-11 DIAGNOSIS — E1169 Type 2 diabetes mellitus with other specified complication: Secondary | ICD-10-CM

## 2017-11-11 DIAGNOSIS — E1121 Type 2 diabetes mellitus with diabetic nephropathy: Secondary | ICD-10-CM | POA: Diagnosis not present

## 2017-11-11 DIAGNOSIS — Z794 Long term (current) use of insulin: Secondary | ICD-10-CM

## 2017-11-11 LAB — BAYER DCA HB A1C WAIVED: HB A1C: 6.1 % (ref <7.0)

## 2017-11-12 LAB — LIPID PANEL
CHOL/HDL RATIO: 2.4 ratio (ref 0.0–5.0)
Cholesterol, Total: 102 mg/dL (ref 100–199)
HDL: 42 mg/dL (ref >39)
LDL CALC: 36 mg/dL (ref 0–99)
Triglycerides: 121 mg/dL (ref 0–149)
VLDL Cholesterol Cal: 24 mg/dL (ref 5–40)

## 2017-11-12 LAB — TESTOSTERONE,FREE AND TOTAL
Testosterone, Free: 12.3 pg/mL (ref 6.6–18.1)
Testosterone: 311 ng/dL (ref 264–916)

## 2017-11-12 LAB — CBC WITH DIFFERENTIAL/PLATELET
BASOS ABS: 0.1 10*3/uL (ref 0.0–0.2)
Basos: 1 %
EOS (ABSOLUTE): 0.4 10*3/uL (ref 0.0–0.4)
Eos: 5 %
HEMOGLOBIN: 16.7 g/dL (ref 13.0–17.7)
Hematocrit: 49.2 % (ref 37.5–51.0)
IMMATURE GRANULOCYTES: 0 %
Immature Grans (Abs): 0 10*3/uL (ref 0.0–0.1)
LYMPHS: 19 %
Lymphocytes Absolute: 1.5 10*3/uL (ref 0.7–3.1)
MCH: 29.7 pg (ref 26.6–33.0)
MCHC: 33.9 g/dL (ref 31.5–35.7)
MCV: 88 fL (ref 79–97)
MONOCYTES: 9 %
Monocytes Absolute: 0.7 10*3/uL (ref 0.1–0.9)
NEUTROS ABS: 5.2 10*3/uL (ref 1.4–7.0)
NEUTROS PCT: 66 %
PLATELETS: 203 10*3/uL (ref 150–450)
RBC: 5.62 x10E6/uL (ref 4.14–5.80)
RDW: 13 % (ref 12.3–15.4)
WBC: 7.9 10*3/uL (ref 3.4–10.8)

## 2017-11-12 LAB — CMP14+EGFR
ALT: 19 IU/L (ref 0–44)
AST: 12 IU/L (ref 0–40)
Albumin/Globulin Ratio: 1.8 (ref 1.2–2.2)
Albumin: 4.4 g/dL (ref 3.6–4.8)
Alkaline Phosphatase: 62 IU/L (ref 39–117)
BUN/Creatinine Ratio: 15 (ref 10–24)
BUN: 19 mg/dL (ref 8–27)
Bilirubin Total: 0.6 mg/dL (ref 0.0–1.2)
CO2: 23 mmol/L (ref 20–29)
Calcium: 9.6 mg/dL (ref 8.6–10.2)
Chloride: 99 mmol/L (ref 96–106)
Creatinine, Ser: 1.23 mg/dL (ref 0.76–1.27)
GFR calc Af Amer: 73 mL/min/{1.73_m2} (ref >59)
GFR calc non Af Amer: 63 mL/min/{1.73_m2} (ref >59)
GLUCOSE: 141 mg/dL — AB (ref 65–99)
Globulin, Total: 2.4 g/dL (ref 1.5–4.5)
POTASSIUM: 4.8 mmol/L (ref 3.5–5.2)
Sodium: 140 mmol/L (ref 134–144)
TOTAL PROTEIN: 6.8 g/dL (ref 6.0–8.5)

## 2017-11-15 ENCOUNTER — Ambulatory Visit: Payer: BLUE CROSS/BLUE SHIELD | Admitting: Family Medicine

## 2017-11-16 ENCOUNTER — Ambulatory Visit: Payer: BLUE CROSS/BLUE SHIELD | Admitting: Family Medicine

## 2017-11-16 ENCOUNTER — Encounter: Payer: Self-pay | Admitting: Family Medicine

## 2017-11-16 VITALS — BP 124/77 | HR 71 | Temp 97.4°F | Ht 72.0 in | Wt 280.0 lb

## 2017-11-16 DIAGNOSIS — E785 Hyperlipidemia, unspecified: Secondary | ICD-10-CM

## 2017-11-16 DIAGNOSIS — E1169 Type 2 diabetes mellitus with other specified complication: Secondary | ICD-10-CM

## 2017-11-16 DIAGNOSIS — E1121 Type 2 diabetes mellitus with diabetic nephropathy: Secondary | ICD-10-CM | POA: Diagnosis not present

## 2017-11-16 DIAGNOSIS — I10 Essential (primary) hypertension: Secondary | ICD-10-CM | POA: Diagnosis not present

## 2017-11-16 DIAGNOSIS — E291 Testicular hypofunction: Secondary | ICD-10-CM

## 2017-11-16 DIAGNOSIS — Z794 Long term (current) use of insulin: Secondary | ICD-10-CM

## 2017-11-16 DIAGNOSIS — Z1211 Encounter for screening for malignant neoplasm of colon: Secondary | ICD-10-CM

## 2017-11-16 MED ORDER — AMLODIPINE BESYLATE 10 MG PO TABS: 10.0000 mg | ORAL_TABLET | Freq: Every day | ORAL | 1 refills | Status: DC

## 2017-11-16 MED ORDER — GLIPIZIDE 5 MG PO TABS: 5.0000 mg | ORAL_TABLET | Freq: Every morning | ORAL | 1 refills | Status: DC

## 2017-11-16 MED ORDER — CANAGLIFLOZIN 300 MG PO TABS: ORAL_TABLET | ORAL | 1 refills | Status: DC

## 2017-11-16 MED ORDER — VALSARTAN-HYDROCHLOROTHIAZIDE 320-25 MG PO TABS: 1.0000 | ORAL_TABLET | Freq: Every day | ORAL | 1 refills | Status: DC

## 2017-11-16 MED ORDER — METOPROLOL SUCCINATE ER 25 MG PO TB24: 25.0000 mg | ORAL_TABLET | Freq: Every day | ORAL | 1 refills | Status: DC

## 2017-11-16 MED ORDER — ROSUVASTATIN CALCIUM 10 MG PO TABS: 10.0000 mg | ORAL_TABLET | Freq: Every day | ORAL | 1 refills | Status: DC

## 2017-11-16 MED ORDER — SITAGLIPTIN PHOS-METFORMIN HCL 50-1000 MG PO TABS: 1.0000 | ORAL_TABLET | Freq: Two times a day (BID) | ORAL | 3 refills | Status: DC

## 2017-11-16 NOTE — Patient Instructions (Signed)

## 2017-11-16 NOTE — Progress Notes (Signed)
Subjective:  Patient ID: Eugene Bautista,  male    DOB: 17-Sep-1955  Age: 62 y.o.    CC: Medical Management of Chronic Issues   HPI Eugene Bautista presents for  follow-up of hypertension. Patient has no history of headache chest pain or shortness of breath or recent cough. Patient also denies symptoms of TIA such as numbness weakness lateralizing. Patient denies side effects from medication. States taking it regularly.  Patient also  in for follow-up of elevated cholesterol. Doing well without complaints on current medication. Denies side effects  including myalgia and arthralgia and nausea. Also in today for liver function testing. Currently no chest pain, shortness of breath or other cardiovascular related symptoms noted.  Follow-up of diabetes. Patient does check blood sugar at home. Readings run between 90 and 150 Patient denies symptoms such as excessive hunger or urinary frequency, excessive hunger, nausea No significant hypoglycemic spells noted. Medications reviewed. Pt reports taking them regularly. Pt. denies complication/adverse reaction today.  NEver got testosterone filled. Some glitch at pharmacy. Energy could be better.  History Eugene Bautista has a past medical history of Carotid artery occlusion, Diabetes mellitus without complication (HCC), Hyperlipidemia, Hypertension, Left carotid bruit, and Obesity.   He has a past surgical history that includes Ruptured Aorta Repair (1989); Urinary surgery (1989); and Fracture surgery (Left, 1989).   His family history includes Aneurysm (age of onset: 61) in his sister; Cancer in his brother; Dementia in his mother; Diabetes in his father; Healthy in his daughter; Heart attack (age of onset: 66) in his father; Heart disease in his father; Kidney disease in his father.He reports that he quit smoking about 16 years ago. His smoking use included cigarettes. He has a 43.75 pack-year smoking history. He has never used smokeless tobacco.  He reports that he drinks about 6.0 standard drinks of alcohol per week. He reports that he does not use drugs.  Current Outpatient Medications on File Prior to Visit  Medication Sig Dispense Refill  . aspirin 81 MG tablet Take 81 mg by mouth daily.    . cholecalciferol (VITAMIN D) 1000 UNITS tablet Take 2,000 Units by mouth daily.    . fish oil-omega-3 fatty acids 1000 MG capsule Take 1 g by mouth 2 (two) times daily.    . ANDROGEL 20.25 MG/1.25GM (1.62%) GEL Apply 2 pumps to the upper chest and shoulders daily for testosterone supplement. (Patient not taking: Reported on 11/16/2017) 150 g 5   No current facility-administered medications on file prior to visit.     ROS Review of Systems  Constitutional: Negative.   HENT: Negative.   Eyes: Negative for visual disturbance.  Respiratory: Negative for cough and shortness of breath.   Cardiovascular: Negative for chest pain and leg swelling.  Gastrointestinal: Negative for abdominal pain, diarrhea, nausea and vomiting.  Genitourinary: Negative for difficulty urinating.  Musculoskeletal: Negative for arthralgias and myalgias.  Skin: Negative for rash.  Neurological: Negative for headaches.  Psychiatric/Behavioral: Negative for sleep disturbance.    Objective:  BP 124/77   Pulse 71   Temp (!) 97.4 F (36.3 C) (Oral)   Ht 6' (1.829 m)   Wt 280 lb (127 kg)   BMI 37.97 kg/m   BP Readings from Last 3 Encounters:  11/16/17 124/77  08/12/17 (!) 111/54  04/20/17 131/73    Wt Readings from Last 3 Encounters:  11/16/17 280 lb (127 kg)  08/12/17 279 lb 8 oz (126.8 kg)  04/20/17 297 lb (134.7 kg)  Physical Exam  Constitutional: He is oriented to person, place, and time. He appears well-developed and well-nourished. No distress.  HENT:  Head: Normocephalic and atraumatic.  Right Ear: External ear normal.  Left Ear: External ear normal.  Nose: Nose normal.  Mouth/Throat: Oropharynx is clear and moist.  Eyes: Pupils are equal,  round, and reactive to light. Conjunctivae and EOM are normal.  Neck: Normal range of motion. Neck supple.  Cardiovascular: Normal rate, regular rhythm and normal heart sounds.  No murmur heard. Pulmonary/Chest: Effort normal and breath sounds normal. No respiratory distress. He has no wheezes. He has no rales.  Abdominal: Soft. There is no tenderness.  Musculoskeletal: Normal range of motion.  Neurological: He is alert and oriented to person, place, and time. He has normal reflexes.  Skin: Skin is warm and dry.  Psychiatric: He has a normal mood and affect. His behavior is normal. Judgment and thought content normal.    Diabetic Foot Exam - Simple   Simple Foot Form Diabetic Foot exam was performed with the following findings:  Yes 11/16/2017  9:02 AM  Visual Inspection No deformities, no ulcerations, no other skin breakdown bilaterally:  Yes Sensation Testing Intact to touch and monofilament testing bilaterally:  Yes Pulse Check Posterior Tibialis and Dorsalis pulse intact bilaterally:  Yes Comments       Assessment & Plan:   Eugene Bautista was seen today for medical management of chronic issues.  Diagnoses and all orders for this visit:  Type 2 diabetes mellitus with diabetic nephropathy, with long-term current use of insulin (HCC)  Hypogonadism, testicular  Dyslipidemia associated with type 2 diabetes mellitus (HCC)  Essential hypertension  Screening for colon cancer -     Ambulatory referral to Gastroenterology  Other orders -     amLODipine (NORVASC) 10 MG tablet; Take 1 tablet (10 mg total) by mouth daily. -     canagliflozin (INVOKANA) 300 MG TABS tablet; TAKE 1 TABLET BY MOUTH  DAILY BEFORE BREAKFAST -     glipiZIDE (GLUCOTROL) 5 MG tablet; Take 1 tablet (5 mg total) by mouth every morning. -     metoprolol succinate (TOPROL-XL) 25 MG 24 hr tablet; Take 1 tablet (25 mg total) by mouth daily. -     rosuvastatin (CRESTOR) 10 MG tablet; Take 1 tablet (10 mg total) by  mouth at bedtime. -     sitaGLIPtin-metformin (JANUMET) 50-1000 MG tablet; Take 1 tablet by mouth 2 (two) times daily. -     valsartan-hydrochlorothiazide (DIOVAN-HCT) 320-25 MG tablet; Take 1 tablet by mouth daily.   I have changed Eugene Bautista Garbe. Corkum's amLODipine, glipiZIDE, metoprolol succinate, rosuvastatin, sitaGLIPtin-metformin, and valsartan-hydrochlorothiazide. I am also having him maintain his aspirin, fish oil-omega-3 fatty acids, cholecalciferol, ANDROGEL, and canagliflozin.  Meds ordered this encounter  Medications  . amLODipine (NORVASC) 10 MG tablet    Sig: Take 1 tablet (10 mg total) by mouth daily.    Dispense:  90 tablet    Refill:  1  . canagliflozin (INVOKANA) 300 MG TABS tablet    Sig: TAKE 1 TABLET BY MOUTH  DAILY BEFORE BREAKFAST    Dispense:  90 tablet    Refill:  1  . glipiZIDE (GLUCOTROL) 5 MG tablet    Sig: Take 1 tablet (5 mg total) by mouth every morning.    Dispense:  90 tablet    Refill:  1  . metoprolol succinate (TOPROL-XL) 25 MG 24 hr tablet    Sig: Take 1 tablet (25 mg total) by mouth daily.  Dispense:  90 tablet    Refill:  1  . rosuvastatin (CRESTOR) 10 MG tablet    Sig: Take 1 tablet (10 mg total) by mouth at bedtime.    Dispense:  90 tablet    Refill:  1  . sitaGLIPtin-metformin (JANUMET) 50-1000 MG tablet    Sig: Take 1 tablet by mouth 2 (two) times daily.    Dispense:  180 tablet    Refill:  3  . valsartan-hydrochlorothiazide (DIOVAN-HCT) 320-25 MG tablet    Sig: Take 1 tablet by mouth daily.    Dispense:  90 tablet    Refill:  1     Follow-up: Return in about 3 months (around 02/15/2018).  Mechele Claude, M.D.

## 2017-11-21 DIAGNOSIS — E669 Obesity, unspecified: Secondary | ICD-10-CM | POA: Diagnosis not present

## 2017-11-21 DIAGNOSIS — Z7689 Persons encountering health services in other specified circumstances: Secondary | ICD-10-CM | POA: Diagnosis not present

## 2017-11-21 DIAGNOSIS — Z6839 Body mass index (BMI) 39.0-39.9, adult: Secondary | ICD-10-CM | POA: Diagnosis not present

## 2017-12-19 DIAGNOSIS — E669 Obesity, unspecified: Secondary | ICD-10-CM | POA: Diagnosis not present

## 2017-12-19 DIAGNOSIS — I10 Essential (primary) hypertension: Secondary | ICD-10-CM | POA: Diagnosis not present

## 2017-12-19 DIAGNOSIS — Z008 Encounter for other general examination: Secondary | ICD-10-CM | POA: Diagnosis not present

## 2017-12-19 DIAGNOSIS — E291 Testicular hypofunction: Secondary | ICD-10-CM | POA: Diagnosis not present

## 2017-12-20 ENCOUNTER — Encounter: Payer: Self-pay | Admitting: Family Medicine

## 2018-02-17 ENCOUNTER — Ambulatory Visit: Payer: BLUE CROSS/BLUE SHIELD | Admitting: Family Medicine

## 2018-03-13 ENCOUNTER — Ambulatory Visit: Payer: BLUE CROSS/BLUE SHIELD | Admitting: Family Medicine

## 2018-03-23 ENCOUNTER — Other Ambulatory Visit: Payer: BLUE CROSS/BLUE SHIELD

## 2018-03-23 DIAGNOSIS — E1169 Type 2 diabetes mellitus with other specified complication: Secondary | ICD-10-CM | POA: Diagnosis not present

## 2018-03-23 DIAGNOSIS — E1121 Type 2 diabetes mellitus with diabetic nephropathy: Secondary | ICD-10-CM

## 2018-03-23 DIAGNOSIS — E291 Testicular hypofunction: Secondary | ICD-10-CM

## 2018-03-23 DIAGNOSIS — Z794 Long term (current) use of insulin: Secondary | ICD-10-CM

## 2018-03-23 DIAGNOSIS — E785 Hyperlipidemia, unspecified: Secondary | ICD-10-CM

## 2018-03-23 LAB — BAYER DCA HB A1C WAIVED: HB A1C (BAYER DCA - WAIVED): 6.5 % (ref <7.0)

## 2018-03-24 LAB — CBC WITH DIFFERENTIAL/PLATELET
BASOS ABS: 0 10*3/uL (ref 0.0–0.2)
Basos: 0 %
EOS (ABSOLUTE): 0.3 10*3/uL (ref 0.0–0.4)
Eos: 4 %
Hematocrit: 48.9 % (ref 37.5–51.0)
Hemoglobin: 16.7 g/dL (ref 13.0–17.7)
IMMATURE GRANS (ABS): 0 10*3/uL (ref 0.0–0.1)
Immature Granulocytes: 0 %
LYMPHS: 20 %
Lymphocytes Absolute: 1.5 10*3/uL (ref 0.7–3.1)
MCH: 30.3 pg (ref 26.6–33.0)
MCHC: 34.2 g/dL (ref 31.5–35.7)
MCV: 89 fL (ref 79–97)
MONOCYTES: 11 %
Monocytes Absolute: 0.8 10*3/uL (ref 0.1–0.9)
Neutrophils Absolute: 4.9 10*3/uL (ref 1.4–7.0)
Neutrophils: 65 %
Platelets: 188 10*3/uL (ref 150–450)
RBC: 5.52 x10E6/uL (ref 4.14–5.80)
RDW: 13.2 % (ref 11.6–15.4)
WBC: 7.5 10*3/uL (ref 3.4–10.8)

## 2018-03-24 LAB — CMP14+EGFR
ALK PHOS: 70 IU/L (ref 39–117)
ALT: 24 IU/L (ref 0–44)
AST: 15 IU/L (ref 0–40)
Albumin/Globulin Ratio: 2 (ref 1.2–2.2)
Albumin: 4.6 g/dL (ref 3.6–4.8)
BUN/Creatinine Ratio: 17 (ref 10–24)
BUN: 23 mg/dL (ref 8–27)
Bilirubin Total: 0.7 mg/dL (ref 0.0–1.2)
CALCIUM: 9.6 mg/dL (ref 8.6–10.2)
CHLORIDE: 100 mmol/L (ref 96–106)
CO2: 22 mmol/L (ref 20–29)
Creatinine, Ser: 1.38 mg/dL — ABNORMAL HIGH (ref 0.76–1.27)
GFR calc non Af Amer: 55 mL/min/{1.73_m2} — ABNORMAL LOW (ref >59)
GFR, EST AFRICAN AMERICAN: 63 mL/min/{1.73_m2} (ref >59)
Globulin, Total: 2.3 g/dL (ref 1.5–4.5)
Glucose: 144 mg/dL — ABNORMAL HIGH (ref 65–99)
Potassium: 4.6 mmol/L (ref 3.5–5.2)
Sodium: 140 mmol/L (ref 134–144)
Total Protein: 6.9 g/dL (ref 6.0–8.5)

## 2018-03-24 LAB — LIPID PANEL
CHOL/HDL RATIO: 2.5 ratio (ref 0.0–5.0)
Cholesterol, Total: 101 mg/dL (ref 100–199)
HDL: 40 mg/dL (ref >39)
LDL Calculated: 33 mg/dL (ref 0–99)
TRIGLYCERIDES: 141 mg/dL (ref 0–149)
VLDL Cholesterol Cal: 28 mg/dL (ref 5–40)

## 2018-03-24 LAB — TESTOSTERONE,FREE AND TOTAL
TESTOSTERONE: 237 ng/dL — AB (ref 264–916)
Testosterone, Free: 12.9 pg/mL (ref 6.6–18.1)

## 2018-03-28 ENCOUNTER — Ambulatory Visit: Payer: BLUE CROSS/BLUE SHIELD | Admitting: Family Medicine

## 2018-03-28 ENCOUNTER — Encounter: Payer: Self-pay | Admitting: Family Medicine

## 2018-03-28 VITALS — BP 130/75 | HR 65 | Temp 97.1°F | Ht 72.0 in | Wt 290.1 lb

## 2018-03-28 DIAGNOSIS — I10 Essential (primary) hypertension: Secondary | ICD-10-CM

## 2018-03-28 DIAGNOSIS — Z794 Long term (current) use of insulin: Secondary | ICD-10-CM

## 2018-03-28 DIAGNOSIS — Z23 Encounter for immunization: Secondary | ICD-10-CM | POA: Diagnosis not present

## 2018-03-28 DIAGNOSIS — N183 Chronic kidney disease, stage 3 unspecified: Secondary | ICD-10-CM

## 2018-03-28 DIAGNOSIS — E291 Testicular hypofunction: Secondary | ICD-10-CM | POA: Diagnosis not present

## 2018-03-28 DIAGNOSIS — E1121 Type 2 diabetes mellitus with diabetic nephropathy: Secondary | ICD-10-CM

## 2018-03-28 DIAGNOSIS — E785 Hyperlipidemia, unspecified: Secondary | ICD-10-CM

## 2018-03-28 DIAGNOSIS — E1169 Type 2 diabetes mellitus with other specified complication: Secondary | ICD-10-CM

## 2018-03-28 MED ORDER — VALSARTAN-HYDROCHLOROTHIAZIDE 320-25 MG PO TABS: 1.0000 | ORAL_TABLET | Freq: Every day | ORAL | 1 refills | Status: DC

## 2018-03-28 MED ORDER — CANAGLIFLOZIN 300 MG PO TABS: ORAL_TABLET | ORAL | 1 refills | Status: DC

## 2018-03-28 MED ORDER — GLIPIZIDE 5 MG PO TABS: 5.0000 mg | ORAL_TABLET | Freq: Every morning | ORAL | 1 refills | Status: DC

## 2018-03-28 MED ORDER — METOPROLOL SUCCINATE ER 25 MG PO TB24: 25.0000 mg | ORAL_TABLET | Freq: Every day | ORAL | 1 refills | Status: DC

## 2018-03-28 MED ORDER — AMLODIPINE BESYLATE 10 MG PO TABS: 10.0000 mg | ORAL_TABLET | Freq: Every day | ORAL | 1 refills | Status: DC

## 2018-03-28 MED ORDER — ROSUVASTATIN CALCIUM 10 MG PO TABS: 10.0000 mg | ORAL_TABLET | Freq: Every day | ORAL | 1 refills | Status: DC

## 2018-03-28 NOTE — Patient Instructions (Signed)
Debrox drops to soften ear wax

## 2018-03-28 NOTE — Progress Notes (Signed)
Subjective:  Patient ID: Eugene Bautista,  male    DOB: [birth year]  Age: 63 y.o.    CC: Medical Management of Chronic Issues   HPI Eugene Bautista presents for  follow-up of hypertension. Patient has no history of headache chest pain or shortness of breath or recent cough. Patient also denies symptoms of TIA such as numbness weakness lateralizing. Patient denies side effects from medication. States taking it regularly.  Patient also  in for follow-up of elevated cholesterol. Doing well without complaints on current medication. Denies side effects  including myalgia and arthralgia and nausea. Also in today for liver function testing. Currently no chest pain, shortness of breath or other cardiovascular related symptoms noted.  Follow-up of diabetes. Patient does check blood sugar at home. Readings run between 100 and 150 Patient denies symptoms such as excessive hunger or urinary frequency, excessive hunger, nausea No significant hypoglycemic spells noted. Admits to being off diet, exercising lesss and gaining weight this quarter. Medications reviewed. Pt reports taking them regularly. Pt. 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 They want him to change Janumet and invokana. He can't remember the alternatives. Will drop the letter off. Also hasn't been able to get the testosterone.   History Eugene Bautista has a past medical history of Carotid artery occlusion, Diabetes mellitus without complication (HCC), Hyperlipidemia, Hypertension, Left carotid bruit, and Obesity.   He has a past surgical history that includes Ruptured Aorta Repair (1989); Urinary surgery (1989); and Fracture surgery (Left, 1989).   His family history includes Aneurysm (age of onset: 1) in his sister; Cancer in his brother; Dementia in his mother; Diabetes in his father; Healthy in his daughter; Heart attack (age of onset: 46) in his father; Heart disease in his father; Kidney disease  in his father.He reports that he quit smoking about 17 years ago. His smoking use included cigarettes. He has a 43.75 pack-year smoking history. He has never used smokeless tobacco. He reports current alcohol use of about 6.0 standard drinks of alcohol per week. He reports that he does not use drugs.  Current Outpatient Medications on File Prior to Visit  Medication Sig Dispense Refill  . ANDROGEL 20.25 MG/1.25GM (1.62%) GEL Apply 2 pumps to the upper chest and shoulders daily for testosterone supplement. 150 g 5  . aspirin 81 MG tablet Take 81 mg by mouth daily.    . cholecalciferol (VITAMIN D) 1000 UNITS tablet Take 2,000 Units by mouth daily.    . fish oil-omega-3 fatty acids 1000 MG capsule Take 1 g by mouth 2 (two) times daily.    . sitaGLIPtin-metformin (JANUMET) 50-1000 MG tablet Take 1 tablet by mouth 2 (two) times daily. 180 tablet 3   No current facility-administered medications on file prior to visit.     ROS Review of Systems  Constitutional: Negative.   HENT: Negative.   Eyes: Negative for visual disturbance.  Respiratory: Negative for cough and shortness of breath.   Cardiovascular: Negative for chest pain and leg swelling.  Gastrointestinal: Negative for abdominal pain, diarrhea, nausea and vomiting.  Genitourinary: Negative for difficulty urinating.  Musculoskeletal: Negative for arthralgias and myalgias.  Skin: Negative for rash.  Neurological: Positive for numbness (feet tingle frequntly. 2-3/10). Negative for headaches.  Psychiatric/Behavioral: Negative for sleep disturbance.    Objective:  BP 130/75   Pulse 65   Temp (!) 97.1 F (36.2 C) (Oral)   Ht 6' (1.829 m)   Wt 290 lb 2 oz (131.6 kg)   BMI  39.35 kg/m   BP Readings from Last 3 Encounters:  03/28/18 130/75  11/16/17 124/77  08/12/17 (!) 111/54    Wt Readings from Last 3 Encounters:  03/28/18 290 lb 2 oz (131.6 kg)  11/16/17 280 lb (127 kg)  08/12/17 279 lb 8 oz (126.8 kg)     Physical  Exam Nursing note reviewed: obese.  Constitutional:      General: He is not in acute distress.    Appearance: He is well-developed.  HENT:     Head: Normocephalic and atraumatic.     Right Ear: External ear normal.     Left Ear: External ear normal.     Nose: Nose normal.  Eyes:     Conjunctiva/sclera: Conjunctivae normal.     Pupils: Pupils are equal, round, and reactive to light.  Neck:     Musculoskeletal: Normal range of motion and neck supple.  Cardiovascular:     Rate and Rhythm: Normal rate and regular rhythm.     Heart sounds: Normal heart sounds. No murmur.  Pulmonary:     Effort: Pulmonary effort is normal. No respiratory distress.     Breath sounds: Normal breath sounds. No wheezing or rales.  Abdominal:     Palpations: Abdomen is soft.     Tenderness: There is no abdominal tenderness.  Musculoskeletal: Normal range of motion.  Skin:    General: Skin is warm and dry.  Neurological:     Mental Status: He is alert and oriented to person, place, and time.     Deep Tendon Reflexes: Reflexes are normal and symmetric.  Psychiatric:        Behavior: Behavior normal.        Thought Content: Thought content normal.        Judgment: Judgment normal.     Diabetic Foot Exam - Simple   Simple Foot Form Diabetic Foot exam was performed with the following findings:  Yes 03/28/2018 11:10 AM  Visual Inspection No deformities, no ulcerations, no other skin breakdown bilaterally:  Yes Sensation Testing Intact to touch and monofilament testing bilaterally:  Yes See comments:  Yes Pulse Check Posterior Tibialis and Dorsalis pulse intact bilaterally:  Yes Comments Possibly numb at tip of right second toe       Assessment & Plan:   Eugene Bautista was seen today for medical management of chronic issues.  Diagnoses and all orders for this visit:  Essential hypertension  Hypogonadism, testicular -     Testosterone,Free and Total; Future  Type 2 diabetes mellitus with  diabetic nephropathy, with long-term current use of insulin (HCC)  Chronic renal impairment, stage 3 (moderate) (HCC)  Dyslipidemia associated with type 2 diabetes mellitus (HCC)  Other orders -     amLODipine (NORVASC) 10 MG tablet; Take 1 tablet (10 mg total) by mouth daily. -     canagliflozin (INVOKANA) 300 MG TABS tablet; TAKE 1 TABLET BY MOUTH  DAILY BEFORE BREAKFAST -     glipiZIDE (GLUCOTROL) 5 MG tablet; Take 1 tablet (5 mg total) by mouth every morning. -     metoprolol succinate (TOPROL-XL) 25 MG 24 hr tablet; Take 1 tablet (25 mg total) by mouth daily. -     rosuvastatin (CRESTOR) 10 MG tablet; Take 1 tablet (10 mg total) by mouth at bedtime. -     valsartan-hydrochlorothiazide (DIOVAN-HCT) 320-25 MG tablet; Take 1 tablet by mouth daily.   I am having Eugene Bautista maintain his aspirin, fish oil-omega-3 fatty acids, cholecalciferol, ANDROGEL, sitaGLIPtin-metformin, amLODipine,  canagliflozin, glipiZIDE, metoprolol succinate, rosuvastatin, and valsartan-hydrochlorothiazide.  Meds ordered this encounter  Medications  . amLODipine (NORVASC) 10 MG tablet    Sig: Take 1 tablet (10 mg total) by mouth daily.    Dispense:  90 tablet    Refill:  1  . canagliflozin (INVOKANA) 300 MG TABS tablet    Sig: TAKE 1 TABLET BY MOUTH  DAILY BEFORE BREAKFAST    Dispense:  90 tablet    Refill:  1  . glipiZIDE (GLUCOTROL) 5 MG tablet    Sig: Take 1 tablet (5 mg total) by mouth every morning.    Dispense:  90 tablet    Refill:  1  . metoprolol succinate (TOPROL-XL) 25 MG 24 hr tablet    Sig: Take 1 tablet (25 mg total) by mouth daily.    Dispense:  90 tablet    Refill:  1  . rosuvastatin (CRESTOR) 10 MG tablet    Sig: Take 1 tablet (10 mg total) by mouth at bedtime.    Dispense:  90 tablet    Refill:  1  . valsartan-hydrochlorothiazide (DIOVAN-HCT) 320-25 MG tablet    Sig: Take 1 tablet by mouth daily.    Dispense:  90 tablet    Refill:  1     Follow-up: Return in about 3 months  (around 06/27/2018).  Mechele ClaudeWarren Oziel Beitler, M.D.

## 2018-04-04 ENCOUNTER — Other Ambulatory Visit: Payer: BLUE CROSS/BLUE SHIELD

## 2018-04-04 DIAGNOSIS — E291 Testicular hypofunction: Secondary | ICD-10-CM

## 2018-04-06 LAB — TESTOSTERONE,FREE AND TOTAL
Testosterone, Free: 9.8 pg/mL (ref 6.6–18.1)
Testosterone: 195 ng/dL — ABNORMAL LOW (ref 264–916)

## 2018-04-07 ENCOUNTER — Other Ambulatory Visit: Payer: Self-pay | Admitting: Family Medicine

## 2018-04-07 ENCOUNTER — Other Ambulatory Visit: Payer: Self-pay | Admitting: *Deleted

## 2018-04-07 MED ORDER — ANDROGEL 20.25 MG/1.25GM (1.62%) TD GEL: TRANSDERMAL | 5 refills | Status: DC

## 2018-05-15 DIAGNOSIS — I10 Essential (primary) hypertension: Secondary | ICD-10-CM | POA: Diagnosis not present

## 2018-05-15 DIAGNOSIS — E785 Hyperlipidemia, unspecified: Secondary | ICD-10-CM | POA: Diagnosis not present

## 2018-05-15 DIAGNOSIS — E119 Type 2 diabetes mellitus without complications: Secondary | ICD-10-CM | POA: Diagnosis not present

## 2018-05-15 DIAGNOSIS — Z008 Encounter for other general examination: Secondary | ICD-10-CM | POA: Diagnosis not present

## 2018-06-27 ENCOUNTER — Ambulatory Visit: Payer: BLUE CROSS/BLUE SHIELD | Admitting: Family Medicine

## 2018-08-14 ENCOUNTER — Other Ambulatory Visit: Payer: Self-pay

## 2018-08-14 ENCOUNTER — Other Ambulatory Visit: Payer: BC Managed Care – PPO

## 2018-08-14 DIAGNOSIS — E1169 Type 2 diabetes mellitus with other specified complication: Secondary | ICD-10-CM | POA: Diagnosis not present

## 2018-08-14 DIAGNOSIS — E785 Hyperlipidemia, unspecified: Secondary | ICD-10-CM | POA: Diagnosis not present

## 2018-08-14 DIAGNOSIS — E1121 Type 2 diabetes mellitus with diabetic nephropathy: Secondary | ICD-10-CM | POA: Diagnosis not present

## 2018-08-14 DIAGNOSIS — E291 Testicular hypofunction: Secondary | ICD-10-CM | POA: Diagnosis not present

## 2018-08-14 DIAGNOSIS — Z794 Long term (current) use of insulin: Secondary | ICD-10-CM

## 2018-08-14 LAB — BAYER DCA HB A1C WAIVED: HB A1C (BAYER DCA - WAIVED): 7 % — ABNORMAL HIGH (ref <7.0)

## 2018-08-16 LAB — CMP14+EGFR
ALT: 23 IU/L (ref 0–44)
AST: 15 IU/L (ref 0–40)
Albumin/Globulin Ratio: 1.8 (ref 1.2–2.2)
Albumin: 4.4 g/dL (ref 3.8–4.8)
Alkaline Phosphatase: 67 IU/L (ref 39–117)
BUN/Creatinine Ratio: 17 (ref 10–24)
BUN: 21 mg/dL (ref 8–27)
Bilirubin Total: 0.8 mg/dL (ref 0.0–1.2)
CO2: 22 mmol/L (ref 20–29)
Calcium: 9.9 mg/dL (ref 8.6–10.2)
Chloride: 102 mmol/L (ref 96–106)
Creatinine, Ser: 1.24 mg/dL (ref 0.76–1.27)
GFR calc Af Amer: 72 mL/min/{1.73_m2} (ref >59)
GFR calc non Af Amer: 62 mL/min/{1.73_m2} (ref >59)
Globulin, Total: 2.5 g/dL (ref 1.5–4.5)
Glucose: 177 mg/dL — ABNORMAL HIGH (ref 65–99)
Potassium: 4.5 mmol/L (ref 3.5–5.2)
Sodium: 140 mmol/L (ref 134–144)
Total Protein: 6.9 g/dL (ref 6.0–8.5)

## 2018-08-16 LAB — CBC WITH DIFFERENTIAL/PLATELET
Basophils Absolute: 0 10*3/uL (ref 0.0–0.2)
Basos: 1 %
EOS (ABSOLUTE): 0.3 10*3/uL (ref 0.0–0.4)
Eos: 4 %
Hematocrit: 49.7 % (ref 37.5–51.0)
Hemoglobin: 16.8 g/dL (ref 13.0–17.7)
Immature Grans (Abs): 0 10*3/uL (ref 0.0–0.1)
Immature Granulocytes: 0 %
Lymphocytes Absolute: 1.6 10*3/uL (ref 0.7–3.1)
Lymphs: 21 %
MCH: 30.2 pg (ref 26.6–33.0)
MCHC: 33.8 g/dL (ref 31.5–35.7)
MCV: 89 fL (ref 79–97)
Monocytes Absolute: 0.8 10*3/uL (ref 0.1–0.9)
Monocytes: 10 %
Neutrophils Absolute: 4.8 10*3/uL (ref 1.4–7.0)
Neutrophils: 64 %
Platelets: 196 10*3/uL (ref 150–450)
RBC: 5.56 x10E6/uL (ref 4.14–5.80)
RDW: 13 % (ref 11.6–15.4)
WBC: 7.5 10*3/uL (ref 3.4–10.8)

## 2018-08-16 LAB — LIPID PANEL
Chol/HDL Ratio: 2.5 ratio (ref 0.0–5.0)
Cholesterol, Total: 112 mg/dL (ref 100–199)
HDL: 44 mg/dL (ref >39)
LDL Calculated: 42 mg/dL (ref 0–99)
Triglycerides: 128 mg/dL (ref 0–149)
VLDL Cholesterol Cal: 26 mg/dL (ref 5–40)

## 2018-08-16 LAB — TESTOSTERONE,FREE AND TOTAL
Testosterone, Free: 12.2 pg/mL (ref 6.6–18.1)
Testosterone: 226 ng/dL — ABNORMAL LOW (ref 264–916)

## 2018-08-20 ENCOUNTER — Other Ambulatory Visit: Payer: Self-pay | Admitting: Family Medicine

## 2018-08-25 ENCOUNTER — Ambulatory Visit (INDEPENDENT_AMBULATORY_CARE_PROVIDER_SITE_OTHER): Payer: BC Managed Care – PPO | Admitting: Family Medicine

## 2018-08-25 ENCOUNTER — Encounter: Payer: Self-pay | Admitting: Family Medicine

## 2018-08-25 DIAGNOSIS — E1121 Type 2 diabetes mellitus with diabetic nephropathy: Secondary | ICD-10-CM | POA: Diagnosis not present

## 2018-08-25 DIAGNOSIS — E291 Testicular hypofunction: Secondary | ICD-10-CM

## 2018-08-25 DIAGNOSIS — I10 Essential (primary) hypertension: Secondary | ICD-10-CM

## 2018-08-25 DIAGNOSIS — Z794 Long term (current) use of insulin: Secondary | ICD-10-CM

## 2018-08-25 MED ORDER — ANDROGEL 20.25 MG/1.25GM (1.62%) TD GEL: TRANSDERMAL | 5 refills | Status: DC

## 2018-08-25 MED ORDER — CANAGLIFLOZIN 300 MG PO TABS: ORAL_TABLET | ORAL | 1 refills | Status: DC

## 2018-08-25 NOTE — Progress Notes (Signed)
Subjective:    Patient ID: Eugene Bautista, male    DOB: [birth year], 63 y.o.   MRN: [id number]   HPI: Eugene ReadyWilliam Clay Bautista is a 63 y.o. male presenting for follow-up of diabetes. Patient does check blood sugar at home Patient denies symptoms such as polyuria, polydipsia, excessive hunger, nausea No significant hypoglycemic spells noted. Medications as noted below. Taking them regularly without complication/adverse reaction being reported today.   Fasting running 150-180. PP 100-120  Eye exam last week.No retinopathy per pt report.   Pt. States he never took testosterone. Insurance wouldn't cover at the pharmacy. Needs it resent to mail order. [facility] 03/28/2018 11/16/2017 08/12/2017 04/20/2017 01/14/2017  Decreased Interest 0 0 0 0 0  Down, Depressed, Hopeless 0 0 0 0 0  PHQ - 2 Score 0 0 0 0 0     Relevant past medical, surgical, family and social history reviewed and updated as indicated.  Interim medical history since our last visit reviewed. Allergies and medications reviewed and updated.  ROS:  Review of Systems  Constitutional: Negative.  Negative for fever.  HENT: Negative.   Eyes: Negative for visual disturbance.  Respiratory: Negative for cough and shortness of breath.   Cardiovascular: Negative for chest pain and leg swelling.  Gastrointestinal: Negative for abdominal pain, diarrhea, nausea and vomiting.  Genitourinary: Negative for difficulty urinating.  Musculoskeletal: Positive for arthralgias (chronic knee pain). Negative for myalgias.  Skin: Negative for rash.  Neurological: Negative for headaches.  Psychiatric/Behavioral: Negative for sleep disturbance.     Social History   Tobacco Use  Smoking Status Former Smoker  . Packs/day: 1.75  . Years: 25.00  . Pack years: 43.75  . Types: Cigarettes  . Quit date: 03/08/2001  . Years since quitting: 17.4  Smokeless Tobacco Never Used       Objective:     Wt Readings from Last 3  Encounters:  03/28/18 290 lb 2 oz (131.6 kg)  11/16/17 280 lb (127 kg)  08/12/17 279 lb 8 oz (126.8 kg)     Exam deferred. Pt. Harboring due to COVID 19. Phone visit performed.   Assessment & Plan:   1. Type 2 diabetes mellitus with diabetic nephropathy, with long-term current use of insulin (HCC)   2. Essential hypertension   3. Hypogonadism, testicular     Meds ordered this encounter  Medications  . canagliflozin (INVOKANA) 300 MG TABS tablet    Sig: TAKE 1 TABLET BY MOUTH  DAILY BEFORE BREAKFAST    Dispense:  90 tablet    Refill:  1  . ANDROGEL 20.25 MG/1.25GM (1.62%) GEL    Sig: Apply 4 pumps to the upper chest and shoulders daily for testosterone supplement.    Dispense:  300 g    Refill:  5    No orders of the defined types were placed in this encounter.     Diagnoses and all orders for this visit:  Type 2 diabetes mellitus with diabetic nephropathy, with long-term current use of insulin (HCC)  Essential hypertension  Hypogonadism, testicular  Other orders -     canagliflozin (INVOKANA) 300 MG TABS tablet; TAKE 1 TABLET BY MOUTH  DAILY BEFORE BREAKFAST -     ANDROGEL 20.25 MG/1.25GM (1.62%) GEL; Apply 4 pumps to the upper chest and shoulders daily for testosterone supplement.    Virtual Visit via telephone Note  I discussed the limitations, risks, security and privacy concerns of performing an evaluation and management service by telephone and the  availability of in person appointments. The patient was identified with two identifiers. Pt.expressed understanding and agreed to proceed. Pt. Is at home. Dr. Livia Snellen is in his office.  Follow Up Instructions:   I discussed the assessment and treatment plan with the patient. The patient was provided an opportunity to ask questions and all were answered. The patient agreed with the plan and demonstrated an understanding of the instructions.   The patient was advised to call back or seek an in-person evaluation if the  symptoms worsen or if the condition fails to improve as anticipated.   Total minutes including chart review and phone contact time: 30   Follow up plan: Return in about 3 months (around 11/25/2018).  Claretta Fraise, MD [place]

## 2018-10-13 ENCOUNTER — Other Ambulatory Visit: Payer: Self-pay | Admitting: Family Medicine

## 2018-12-25 DIAGNOSIS — E669 Obesity, unspecified: Secondary | ICD-10-CM | POA: Diagnosis not present

## 2018-12-25 DIAGNOSIS — I10 Essential (primary) hypertension: Secondary | ICD-10-CM | POA: Diagnosis not present

## 2018-12-25 DIAGNOSIS — E291 Testicular hypofunction: Secondary | ICD-10-CM | POA: Diagnosis not present

## 2018-12-25 DIAGNOSIS — Z008 Encounter for other general examination: Secondary | ICD-10-CM | POA: Diagnosis not present

## 2019-01-08 DIAGNOSIS — Z7689 Persons encountering health services in other specified circumstances: Secondary | ICD-10-CM | POA: Diagnosis not present

## 2019-01-08 DIAGNOSIS — Z6841 Body Mass Index (BMI) 40.0 and over, adult: Secondary | ICD-10-CM | POA: Diagnosis not present

## 2019-01-08 DIAGNOSIS — E669 Obesity, unspecified: Secondary | ICD-10-CM | POA: Diagnosis not present

## 2019-01-22 ENCOUNTER — Other Ambulatory Visit: Payer: BC Managed Care – PPO

## 2019-01-22 DIAGNOSIS — Z6841 Body Mass Index (BMI) 40.0 and over, adult: Secondary | ICD-10-CM | POA: Diagnosis not present

## 2019-01-22 DIAGNOSIS — E291 Testicular hypofunction: Secondary | ICD-10-CM | POA: Diagnosis not present

## 2019-01-22 DIAGNOSIS — E785 Hyperlipidemia, unspecified: Secondary | ICD-10-CM | POA: Diagnosis not present

## 2019-01-22 DIAGNOSIS — Z7689 Persons encountering health services in other specified circumstances: Secondary | ICD-10-CM | POA: Diagnosis not present

## 2019-01-22 DIAGNOSIS — E1121 Type 2 diabetes mellitus with diabetic nephropathy: Secondary | ICD-10-CM | POA: Diagnosis not present

## 2019-01-22 DIAGNOSIS — E1169 Type 2 diabetes mellitus with other specified complication: Secondary | ICD-10-CM | POA: Diagnosis not present

## 2019-01-22 DIAGNOSIS — E669 Obesity, unspecified: Secondary | ICD-10-CM | POA: Diagnosis not present

## 2019-01-22 LAB — BAYER DCA HB A1C WAIVED: HB A1C (BAYER DCA - WAIVED): 7.7 % — ABNORMAL HIGH (ref <7.0)

## 2019-01-23 ENCOUNTER — Ambulatory Visit (INDEPENDENT_AMBULATORY_CARE_PROVIDER_SITE_OTHER): Payer: BC Managed Care – PPO | Admitting: Family Medicine

## 2019-01-23 ENCOUNTER — Encounter: Payer: Self-pay | Admitting: Family Medicine

## 2019-01-23 DIAGNOSIS — E785 Hyperlipidemia, unspecified: Secondary | ICD-10-CM

## 2019-01-23 DIAGNOSIS — E291 Testicular hypofunction: Secondary | ICD-10-CM | POA: Diagnosis not present

## 2019-01-23 DIAGNOSIS — Z794 Long term (current) use of insulin: Secondary | ICD-10-CM

## 2019-01-23 DIAGNOSIS — E1169 Type 2 diabetes mellitus with other specified complication: Secondary | ICD-10-CM

## 2019-01-23 DIAGNOSIS — I10 Essential (primary) hypertension: Secondary | ICD-10-CM

## 2019-01-23 DIAGNOSIS — E1121 Type 2 diabetes mellitus with diabetic nephropathy: Secondary | ICD-10-CM

## 2019-01-23 LAB — CBC WITH DIFFERENTIAL/PLATELET
Basophils Absolute: 0.1 10*3/uL (ref 0.0–0.2)
Basos: 1 %
EOS (ABSOLUTE): 0.4 10*3/uL (ref 0.0–0.4)
Eos: 4 %
Hematocrit: 50.7 % (ref 37.5–51.0)
Hemoglobin: 17.2 g/dL (ref 13.0–17.7)
Immature Grans (Abs): 0 10*3/uL (ref 0.0–0.1)
Immature Granulocytes: 0 %
Lymphocytes Absolute: 1.6 10*3/uL (ref 0.7–3.1)
Lymphs: 19 %
MCH: 29.8 pg (ref 26.6–33.0)
MCHC: 33.9 g/dL (ref 31.5–35.7)
MCV: 88 fL (ref 79–97)
Monocytes Absolute: 0.8 10*3/uL (ref 0.1–0.9)
Monocytes: 10 %
Neutrophils Absolute: 5.7 10*3/uL (ref 1.4–7.0)
Neutrophils: 66 %
Platelets: 191 10*3/uL (ref 150–450)
RBC: 5.78 x10E6/uL (ref 4.14–5.80)
RDW: 12.8 % (ref 11.6–15.4)
WBC: 8.5 10*3/uL (ref 3.4–10.8)

## 2019-01-23 LAB — LIPID PANEL
Chol/HDL Ratio: 2.6 ratio (ref 0.0–5.0)
Cholesterol, Total: 110 mg/dL (ref 100–199)
HDL: 43 mg/dL (ref >39)
LDL Chol Calc (NIH): 39 mg/dL (ref 0–99)
Triglycerides: 171 mg/dL — ABNORMAL HIGH (ref 0–149)
VLDL Cholesterol Cal: 28 mg/dL (ref 5–40)

## 2019-01-23 LAB — TESTOSTERONE,FREE AND TOTAL
Testosterone, Free: 18 pg/mL (ref 6.6–18.1)
Testosterone: 293 ng/dL (ref 264–916)

## 2019-01-23 LAB — CMP14+EGFR
ALT: 25 IU/L (ref 0–44)
AST: 19 IU/L (ref 0–40)
Albumin/Globulin Ratio: 1.9 (ref 1.2–2.2)
Albumin: 4.5 g/dL (ref 3.8–4.8)
Alkaline Phosphatase: 77 IU/L (ref 39–117)
BUN/Creatinine Ratio: 15 (ref 10–24)
BUN: 19 mg/dL (ref 8–27)
Bilirubin Total: 0.9 mg/dL (ref 0.0–1.2)
CO2: 20 mmol/L (ref 20–29)
Calcium: 9.7 mg/dL (ref 8.6–10.2)
Chloride: 98 mmol/L (ref 96–106)
Creatinine, Ser: 1.27 mg/dL (ref 0.76–1.27)
GFR calc Af Amer: 70 mL/min/{1.73_m2} (ref >59)
GFR calc non Af Amer: 60 mL/min/{1.73_m2} (ref >59)
Globulin, Total: 2.4 g/dL (ref 1.5–4.5)
Glucose: 197 mg/dL — ABNORMAL HIGH (ref 65–99)
Potassium: 4.7 mmol/L (ref 3.5–5.2)
Sodium: 138 mmol/L (ref 134–144)
Total Protein: 6.9 g/dL (ref 6.0–8.5)

## 2019-01-23 MED ORDER — FARXIGA 10 MG PO TABS: 10.0000 mg | ORAL_TABLET | Freq: Every day | ORAL | 1 refills | Status: DC

## 2019-01-23 MED ORDER — TESTOSTERONE 10 MG/ACT (2%) TD GEL: TRANSDERMAL | 1 refills | Status: DC

## 2019-01-23 NOTE — Progress Notes (Signed)
Subjective:  Patient ID: Eugene Bautista, male    DOB: Dec 11, 1955  Age: 63 y.o. MRN: [id number]  CC: No chief complaint on file.   HPI Eugene Bautista presents forFollow-up of diabetes. Patient checks blood sugar at home.   160 fasting and 120 postprandial. Eating too much sugar.A little ice cream, chocolate, etc. Needs to lose weight. Now at 294.  Patient denies symptoms such as polyuria, polydipsia, excessive hunger, nausea.  No significant hypoglycemic spells noted. Medications reviewed. Pt reports taking them regularly without complication/adverse reaction being reported today.  Not taking testosterone. Was too expensive.    presents for  follow-up of hypertension. Patient has no history of headache chest pain or shortness of breath or recent cough. Patient also denies symptoms of TIA such as focal numbness or weakness. Patient denies side effects from medication. States taking it regularly. Running 115/75 through  Clovis Riley, NP at his work.    History Eugene Bautista has a past medical history of Carotid artery occlusion, Diabetes mellitus without complication (HCC), Hyperlipidemia, Hypertension, Left carotid bruit, and Obesity.   He has a past surgical history that includes Ruptured Aorta Repair (1989); Urinary surgery (1989); and Fracture surgery (Left, 1989).   His family history includes Aneurysm (age of onset: 24) in his sister; Cancer in his brother; Dementia in his mother; Diabetes in his father; Healthy in his daughter; Heart attack (age of onset: 21) in his father; Heart disease in his father; Kidney disease in his father.He reports that he quit smoking about 17 years ago. His smoking use included cigarettes. He has a 43.75 pack-year smoking history. He has never used smokeless tobacco. He reports current alcohol use of about 6.0 standard drinks of alcohol per week. He reports that he does not use drugs.  Current Outpatient Medications on File Prior to Visit  Medication  Sig Dispense Refill  . amLODipine (NORVASC) 10 MG tablet TAKE 1 TABLET BY MOUTH  DAILY 90 tablet 1  . aspirin 81 MG tablet Take 81 mg by mouth daily.    . cholecalciferol (VITAMIN D) 1000 UNITS tablet Take 2,000 Units by mouth daily.    . fish oil-omega-3 fatty acids 1000 MG capsule Take 1 g by mouth 2 (two) times daily.    Marland Kitchen glipiZIDE (GLUCOTROL) 5 MG tablet TAKE 1 TABLET BY MOUTH  EVERY MORNING 90 tablet 1  . JANUMET 50-1000 MG tablet TAKE 1 TABLET BY MOUTH TWO  TIMES DAILY 180 tablet 3  . metoprolol succinate (TOPROL-XL) 25 MG 24 hr tablet TAKE 1 TABLET BY MOUTH  DAILY 90 tablet 1  . rosuvastatin (CRESTOR) 10 MG tablet TAKE 1 TABLET BY MOUTH AT  BEDTIME 90 tablet 3  . valsartan-hydrochlorothiazide (DIOVAN-HCT) 320-25 MG tablet TAKE 1 TABLET BY MOUTH  DAILY 90 tablet 3   No current facility-administered medications on file prior to visit.     ROS Review of Systems  Constitutional: Negative.   HENT: Negative.   Eyes: Negative for visual disturbance.  Respiratory: Negative for cough and shortness of breath.   Cardiovascular: Negative for chest pain and leg swelling.  Gastrointestinal: Negative for abdominal pain, diarrhea, nausea and vomiting.  Genitourinary: Negative for difficulty urinating.  Musculoskeletal: Negative for arthralgias and myalgias.  Skin: Negative for rash.  Neurological: Negative for headaches.  Psychiatric/Behavioral: Negative for sleep disturbance.    Objective:  There were no vitals taken for this visit.  BP Readings from Last 3 Encounters:  03/28/18 130/75  11/16/17 124/77  08/12/17 (!) 111/54  Wt Readings from Last 3 Encounters:  03/28/18 290 lb 2 oz (131.6 kg)  11/16/17 280 lb (127 kg)  08/12/17 279 lb 8 oz (126.8 kg)     Physical Exam  Exam deferred. Pt. Harboring due to COVID 19. Phone visit performed.   Assessment & Plan:   Diagnoses and all orders for this visit:  Type 2 diabetes mellitus with diabetic nephropathy, with long-term  current use of insulin (HCC)  Hypogonadism, testicular  Essential hypertension  Dyslipidemia associated with type 2 diabetes mellitus ([place])  Other orders -     dapagliflozin propanediol (FARXIGA) 10 MG TABS tablet; Take 10 mg by mouth daily before breakfast. -     Testosterone 10 MG/ACT (2%) GEL; Apply 2 pumps to the inner surface of each thigh daily (four pumps total)      I have discontinued Luanna Cole. Kanner's canagliflozin and AndroGel. I am also having him start on Farxiga and Testosterone. Additionally, I am having him maintain his aspirin, fish oil-omega-3 fatty acids, cholecalciferol, metoprolol succinate, amLODipine, glipiZIDE, rosuvastatin, Janumet, and valsartan-hydrochlorothiazide.  Meds ordered this encounter  Medications  . dapagliflozin propanediol (FARXIGA) 10 MG TABS tablet    Sig: Take 10 mg by mouth daily before breakfast.    Dispense:  90 tablet    Refill:  1  . Testosterone 10 MG/ACT (2%) GEL    Sig: Apply 2 pumps to the inner surface of each thigh daily (four pumps total)    Dispense:  180 g    Refill:  1   Pt. Will renew effort to decrease carb intake. Exercise more. Lose weiht. I will seek prior auth for his testosterone supplement   Virtual Visit via telephone Note  I discussed the limitations, risks, security and privacy concerns of performing an evaluation and management service by telephone and the availability of in person appointments. I also discussed with the patient that there may be a patient responsible charge related to this service. The patient expressed understanding and agreed to proceed. Pt. Is at home. Dr. Livia Snellen is in his office.  Follow Up Instructions:   I discussed the assessment and treatment plan with the patient. The patient was provided an opportunity to ask questions and all were answered. The patient agreed with the plan and demonstrated an understanding of the instructions.   The patient was advised to call back or seek an  in-person evaluation if the symptoms worsen or if the condition fails to improve as anticipated.  Total minutes including chart review and phone contact time: 25  Follow-up: Return in about 3 months (around 04/25/2019) for diabetes, hypertension.  Claretta Fraise, M.D.

## 2019-01-24 ENCOUNTER — Telehealth: Payer: Self-pay | Admitting: *Deleted

## 2019-01-24 NOTE — Telephone Encounter (Signed)
Testosterone 10mg /ACT 2%-not covered by insurance-  In your office notes it states pt wasn't taking testosterone because it was too expensive and his testosterone levels were WNL. Don't think insurance will cover any supplement with normal testosterone levels.

## 2019-01-25 NOTE — Telephone Encounter (Signed)
Attempted to contact patient - NA °

## 2019-01-25 NOTE — Telephone Encounter (Signed)
rc for nurse 

## 2019-01-25 NOTE — Telephone Encounter (Signed)
Level have been low in past and are borderline now. Please let him know that it can't be covered for now. I had this discussion with him, so he is prepared. I suggest we check a level every 4-6 weeks until it is clearly normal or low. Thanks. WS

## 2019-01-25 NOTE — Telephone Encounter (Signed)
Patient aware and verbalizes understanding. 

## 2019-02-12 DIAGNOSIS — Z6841 Body Mass Index (BMI) 40.0 and over, adult: Secondary | ICD-10-CM | POA: Diagnosis not present

## 2019-02-12 DIAGNOSIS — E669 Obesity, unspecified: Secondary | ICD-10-CM | POA: Diagnosis not present

## 2019-02-12 DIAGNOSIS — Z7689 Persons encountering health services in other specified circumstances: Secondary | ICD-10-CM | POA: Diagnosis not present

## 2019-02-26 ENCOUNTER — Telehealth: Payer: Self-pay | Admitting: Family Medicine

## 2019-02-26 MED ORDER — JANUMET 50-1000 MG PO TABS: 1.0000 | ORAL_TABLET | Freq: Two times a day (BID) | ORAL | 3 refills | Status: DC

## 2019-02-26 NOTE — Telephone Encounter (Signed)
Meds sent in left message

## 2019-02-26 NOTE — Telephone Encounter (Signed)
What is the name of the medication? JANUMET 50-1000 MG tablet  Have you contacted your pharmacy to request a refill? No more refills, pt was here 01/23/2019  Which pharmacy would you like this sent to? [facility]   Patient notified that their request is being sent to the clinical staff for review and that they should receive a call once it is complete. If they do not receive a call within 24 hours they can check with their pharmacy or our office.

## 2019-03-04 ENCOUNTER — Other Ambulatory Visit: Payer: Self-pay | Admitting: Family Medicine

## 2019-06-27 ENCOUNTER — Other Ambulatory Visit: Payer: BC Managed Care – PPO

## 2019-06-27 ENCOUNTER — Other Ambulatory Visit: Payer: Self-pay

## 2019-06-27 DIAGNOSIS — E1169 Type 2 diabetes mellitus with other specified complication: Secondary | ICD-10-CM

## 2019-06-27 DIAGNOSIS — E1121 Type 2 diabetes mellitus with diabetic nephropathy: Secondary | ICD-10-CM

## 2019-06-27 DIAGNOSIS — E785 Hyperlipidemia, unspecified: Secondary | ICD-10-CM

## 2019-06-27 DIAGNOSIS — E291 Testicular hypofunction: Secondary | ICD-10-CM

## 2019-06-27 LAB — BAYER DCA HB A1C WAIVED: HB A1C (BAYER DCA - WAIVED): 7.7 % — ABNORMAL HIGH (ref <7.0)

## 2019-06-29 LAB — CMP14+EGFR
ALT: 19 IU/L (ref 0–44)
AST: 16 IU/L (ref 0–40)
Albumin/Globulin Ratio: 1.7 (ref 1.2–2.2)
Albumin: 4.4 g/dL (ref 3.8–4.8)
Alkaline Phosphatase: 77 IU/L (ref 39–117)
BUN/Creatinine Ratio: 16 (ref 10–24)
BUN: 20 mg/dL (ref 8–27)
Bilirubin Total: 0.9 mg/dL (ref 0.0–1.2)
CO2: 26 mmol/L (ref 20–29)
Calcium: 9.7 mg/dL (ref 8.6–10.2)
Chloride: 97 mmol/L (ref 96–106)
Creatinine, Ser: 1.27 mg/dL (ref 0.76–1.27)
GFR calc Af Amer: 70 mL/min/{1.73_m2} (ref >59)
GFR calc non Af Amer: 60 mL/min/{1.73_m2} (ref >59)
Globulin, Total: 2.6 g/dL (ref 1.5–4.5)
Glucose: 149 mg/dL — ABNORMAL HIGH (ref 65–99)
Potassium: 4.6 mmol/L (ref 3.5–5.2)
Sodium: 136 mmol/L (ref 134–144)
Total Protein: 7 g/dL (ref 6.0–8.5)

## 2019-06-29 LAB — CBC WITH DIFFERENTIAL/PLATELET
Basophils Absolute: 0 10*3/uL (ref 0.0–0.2)
Basos: 1 %
EOS (ABSOLUTE): 0.3 10*3/uL (ref 0.0–0.4)
Eos: 4 %
Hematocrit: 51.2 % — ABNORMAL HIGH (ref 37.5–51.0)
Hemoglobin: 17.3 g/dL (ref 13.0–17.7)
Immature Grans (Abs): 0 10*3/uL (ref 0.0–0.1)
Immature Granulocytes: 1 %
Lymphocytes Absolute: 1.4 10*3/uL (ref 0.7–3.1)
Lymphs: 18 %
MCH: 30.6 pg (ref 26.6–33.0)
MCHC: 33.8 g/dL (ref 31.5–35.7)
MCV: 91 fL (ref 79–97)
Monocytes Absolute: 0.9 10*3/uL (ref 0.1–0.9)
Monocytes: 12 %
Neutrophils Absolute: 5.2 10*3/uL (ref 1.4–7.0)
Neutrophils: 64 %
Platelets: 197 10*3/uL (ref 150–450)
RBC: 5.66 x10E6/uL (ref 4.14–5.80)
RDW: 13.1 % (ref 11.6–15.4)
WBC: 7.9 10*3/uL (ref 3.4–10.8)

## 2019-06-29 LAB — TESTOSTERONE,FREE AND TOTAL
Testosterone, Free: 8.3 pg/mL (ref 6.6–18.1)
Testosterone: 189 ng/dL — ABNORMAL LOW (ref 264–916)

## 2019-06-29 LAB — LIPID PANEL
Chol/HDL Ratio: 2.7 ratio (ref 0.0–5.0)
Cholesterol, Total: 109 mg/dL (ref 100–199)
HDL: 41 mg/dL (ref >39)
LDL Chol Calc (NIH): 40 mg/dL (ref 0–99)
Triglycerides: 165 mg/dL — ABNORMAL HIGH (ref 0–149)
VLDL Cholesterol Cal: 28 mg/dL (ref 5–40)

## 2019-07-03 ENCOUNTER — Telehealth: Payer: Self-pay | Admitting: Family Medicine

## 2019-07-03 NOTE — Telephone Encounter (Signed)
Pt returned missed call from [place] regarding lab work. Pt says he does not take testosterone injections.

## 2019-07-03 NOTE — Telephone Encounter (Signed)
See lab result

## 2019-07-17 ENCOUNTER — Ambulatory Visit: Payer: BC Managed Care – PPO | Admitting: Family Medicine

## 2019-08-01 ENCOUNTER — Other Ambulatory Visit: Payer: Self-pay

## 2019-08-01 ENCOUNTER — Ambulatory Visit: Payer: BC Managed Care – PPO | Admitting: Family Medicine

## 2019-08-01 ENCOUNTER — Encounter: Payer: Self-pay | Admitting: Family Medicine

## 2019-08-01 VITALS — BP 131/67 | HR 77 | Temp 97.8°F | Resp 20 | Ht 72.0 in | Wt 291.4 lb

## 2019-08-01 DIAGNOSIS — I10 Essential (primary) hypertension: Secondary | ICD-10-CM

## 2019-08-01 DIAGNOSIS — N1831 Chronic kidney disease, stage 3a: Secondary | ICD-10-CM

## 2019-08-01 DIAGNOSIS — E1169 Type 2 diabetes mellitus with other specified complication: Secondary | ICD-10-CM

## 2019-08-01 DIAGNOSIS — E1121 Type 2 diabetes mellitus with diabetic nephropathy: Secondary | ICD-10-CM

## 2019-08-01 DIAGNOSIS — E1140 Type 2 diabetes mellitus with diabetic neuropathy, unspecified: Secondary | ICD-10-CM

## 2019-08-01 DIAGNOSIS — E291 Testicular hypofunction: Secondary | ICD-10-CM | POA: Diagnosis not present

## 2019-08-01 DIAGNOSIS — Z794 Long term (current) use of insulin: Secondary | ICD-10-CM

## 2019-08-01 DIAGNOSIS — E785 Hyperlipidemia, unspecified: Secondary | ICD-10-CM

## 2019-08-01 MED ORDER — OZEMPIC (0.25 OR 0.5 MG/DOSE) 2 MG/1.5ML ~~LOC~~ SOPN: 0.5000 mg | PEN_INJECTOR | SUBCUTANEOUS | 1 refills | Status: DC

## 2019-08-01 MED ORDER — METFORMIN HCL 1000 MG PO TABS: 1000.0000 mg | ORAL_TABLET | Freq: Two times a day (BID) | ORAL | 3 refills | Status: DC

## 2019-08-01 MED ORDER — METFORMIN HCL 1000 MG PO TABS: 1000.0000 mg | ORAL_TABLET | Freq: Two times a day (BID) | ORAL | 0 refills | Status: DC

## 2019-08-01 NOTE — Progress Notes (Signed)
Subjective:  Patient ID: Eugene Bautista, male    DOB: [birth year]  Age: 64 y.o. MRN: [id number]  CC: Medical Management of Chronic Issues   HPI Eugene Bautista presents forFollow-up of diabetes. Patient checks blood sugar at home.   150 fasting and 210 postprandial Patient denies symptoms such as polyuria, polydipsia, excessive hunger, nausea No significant hypoglycemic spells noted. Medications reviewed. Pt reports taking them regularly without complication/adverse reaction being reported today.  Checking feet daily.  Patient does not want to take testosterone because the gel is annoying and he does not want to have to take a shot   History Eugene Bautista has a past medical history of Carotid artery occlusion, Diabetes mellitus without complication (HCC), Hyperlipidemia, Hypertension, Left carotid bruit, and Obesity.   He has a past surgical history that includes Ruptured Aorta Repair (1989); Urinary surgery (1989); and Fracture surgery (Left, 1989).   His family history includes Aneurysm (age of onset: 89) in his sister; Cancer in his brother; Dementia in his mother; Diabetes in his father; Healthy in his daughter; Heart attack (age of onset: 54) in his father; Heart disease in his father; Kidney disease in his father.He reports that he quit smoking about 18 years ago. His smoking use included cigarettes. He has a 43.75 pack-year smoking history. He has never used smokeless tobacco. He reports current alcohol use of about 6.0 standard drinks of alcohol per week. He reports that he does not use drugs.  Current Outpatient Medications on File Prior to Visit  Medication Sig Dispense Refill  . amLODipine (NORVASC) 10 MG tablet TAKE 1 TABLET BY MOUTH  DAILY 90 tablet 3  . aspirin 81 MG tablet Take 81 mg by mouth daily.    . cholecalciferol (VITAMIN D) 1000 UNITS tablet Take 2,000 Units by mouth daily.    . dapagliflozin propanediol (FARXIGA) 10 MG TABS tablet Take 10 mg by mouth daily  before breakfast. 90 tablet 1  . fish oil-omega-3 fatty acids 1000 MG capsule Take 1 g by mouth 2 (two) times daily.    Marland Kitchen glipiZIDE (GLUCOTROL) 5 MG tablet TAKE 1 TABLET BY MOUTH IN  THE MORNING 90 tablet 3  . metoprolol succinate (TOPROL-XL) 25 MG 24 hr tablet TAKE 1 TABLET BY MOUTH  DAILY 90 tablet 3  . rosuvastatin (CRESTOR) 10 MG tablet TAKE 1 TABLET BY MOUTH AT  BEDTIME 90 tablet 3  . valsartan-hydrochlorothiazide (DIOVAN-HCT) 320-25 MG tablet TAKE 1 TABLET BY MOUTH  DAILY 90 tablet 3  . Testosterone 10 MG/ACT (2%) GEL Apply 2 pumps to the inner surface of each thigh daily (four pumps total) (Patient not taking: Reported on 08/01/2019) 180 g 1   No current facility-administered medications on file prior to visit.    ROS Review of Systems  Constitutional: Negative.   HENT: Negative.   Eyes: Negative for visual disturbance.  Respiratory: Negative for cough and shortness of breath.   Cardiovascular: Negative for chest pain and leg swelling.  Gastrointestinal: Negative for abdominal pain, diarrhea, nausea and vomiting.  Genitourinary: Negative for difficulty urinating.  Musculoskeletal: Negative for arthralgias and myalgias.  Skin: Negative for rash.  Neurological: Positive for numbness (Feet ). Negative for headaches.  Psychiatric/Behavioral: Negative for sleep disturbance.    Objective:  BP 131/67   Pulse 77   Temp 97.8 F (36.6 C) (Temporal)   Resp 20   Ht 6' (1.829 m)   Wt 291 lb 6 oz (132.2 kg)   SpO2 99%   BMI 39.52 kg/m  BP Readings from Last 3 Encounters:  08/01/19 131/67  03/28/18 130/75  11/16/17 124/77    Wt Readings from Last 3 Encounters:  08/01/19 291 lb 6 oz (132.2 kg)  03/28/18 290 lb 2 oz (131.6 kg)  11/16/17 280 lb (127 kg)     Physical Exam Vitals reviewed.  Constitutional:      Appearance: He is well-developed.  HENT:     Head: Normocephalic and atraumatic.     Right Ear: Tympanic membrane and external ear normal. No decreased hearing  noted.     Left Ear: Tympanic membrane and external ear normal. No decreased hearing noted.     Mouth/Throat:     Pharynx: No oropharyngeal exudate or posterior oropharyngeal erythema.  Eyes:     Pupils: Pupils are equal, round, and reactive to light.  Cardiovascular:     Rate and Rhythm: Normal rate and regular rhythm.     Heart sounds: No murmur.  Pulmonary:     Effort: No respiratory distress.     Breath sounds: Normal breath sounds.  Abdominal:     General: Bowel sounds are normal.     Palpations: Abdomen is soft. There is no mass.     Tenderness: There is no abdominal tenderness.  Musculoskeletal:     Cervical back: Normal range of motion and neck supple.       Assessment & Plan:   Eugene Bautista was seen today for medical management of chronic issues.  Diagnoses and all orders for this visit:  Type 2 diabetes mellitus with diabetic nephropathy, with long-term current use of insulin ([place])  Dyslipidemia associated with type 2 diabetes mellitus ([place])  Essential hypertension  Hypogonadism, testicular  Chronic renal impairment, stage 3a  Type 2 diabetes mellitus with diabetic neuropathy, without long-term current use of insulin ([place])  Other orders -     metFORMIN (GLUCOPHAGE) 1000 MG tablet; Take 1 tablet (1,000 mg total) by mouth 2 (two) times daily with a meal. -     metFORMIN (GLUCOPHAGE) 1000 MG tablet; Take 1 tablet (1,000 mg total) by mouth 2 (two) times daily with a meal. -     Semaglutide,0.25 or 0.5MG /DOS, (OZEMPIC, 0.25 OR 0.5 MG/DOSE,) 2 MG/1.5ML SOPN; Inject 0.5 mg into the skin once a week.   We discussed the pros and cons of testosterone supplementation.  We will hold off on that unless he notices symptoms that could be improved by the supplement.   I have discontinued Eugene Cole. Bautista's Janumet. I am also having him start on metFORMIN, metFORMIN, and Ozempic (0.25 or 0.5 MG/DOSE). Additionally, I am having him maintain his aspirin, fish oil-omega-3 fatty  acids, cholecalciferol, rosuvastatin, valsartan-hydrochlorothiazide, Farxiga, Testosterone, glipiZIDE, amLODipine, and metoprolol succinate.  Meds ordered this encounter  Medications  . metFORMIN (GLUCOPHAGE) 1000 MG tablet    Sig: Take 1 tablet (1,000 mg total) by mouth 2 (two) times daily with a meal.    Dispense:  180 tablet    Refill:  3  . metFORMIN (GLUCOPHAGE) 1000 MG tablet    Sig: Take 1 tablet (1,000 mg total) by mouth 2 (two) times daily with a meal.    Dispense:  60 tablet    Refill:  0  . Semaglutide,0.25 or 0.5MG /DOS, (OZEMPIC, 0.25 OR 0.5 MG/DOSE,) 2 MG/1.5ML SOPN    Sig: Inject 0.5 mg into the skin once a week.    Dispense:  3 pen    Refill:  1     Follow-up: Return in about 3 months (around 11/01/2019).  Claretta Fraise, M.D.

## 2019-08-04 ENCOUNTER — Other Ambulatory Visit: Payer: Self-pay | Admitting: Family Medicine

## 2019-08-21 LAB — HM DIABETES EYE EXAM

## 2019-08-23 ENCOUNTER — Other Ambulatory Visit: Payer: Self-pay

## 2019-08-23 MED ORDER — METFORMIN HCL 1000 MG PO TABS: 1000.0000 mg | ORAL_TABLET | Freq: Two times a day (BID) | ORAL | 0 refills | Status: DC

## 2019-10-15 ENCOUNTER — Ambulatory Visit: Payer: BC Managed Care – PPO | Admitting: Family Medicine

## 2019-10-19 ENCOUNTER — Other Ambulatory Visit: Payer: BC Managed Care – PPO

## 2019-10-19 ENCOUNTER — Other Ambulatory Visit: Payer: Self-pay

## 2019-10-19 DIAGNOSIS — E1169 Type 2 diabetes mellitus with other specified complication: Secondary | ICD-10-CM

## 2019-10-19 DIAGNOSIS — E291 Testicular hypofunction: Secondary | ICD-10-CM | POA: Diagnosis not present

## 2019-10-19 DIAGNOSIS — E785 Hyperlipidemia, unspecified: Secondary | ICD-10-CM | POA: Diagnosis not present

## 2019-10-19 DIAGNOSIS — E1121 Type 2 diabetes mellitus with diabetic nephropathy: Secondary | ICD-10-CM | POA: Diagnosis not present

## 2019-10-19 LAB — BAYER DCA HB A1C WAIVED: HB A1C (BAYER DCA - WAIVED): 7.5 % — ABNORMAL HIGH (ref <7.0)

## 2019-10-23 LAB — CBC WITH DIFFERENTIAL/PLATELET
Basophils Absolute: 0 10*3/uL (ref 0.0–0.2)
Basos: 1 %
EOS (ABSOLUTE): 0.2 10*3/uL (ref 0.0–0.4)
Eos: 3 %
Hematocrit: 51.5 % — ABNORMAL HIGH (ref 37.5–51.0)
Hemoglobin: 17.5 g/dL (ref 13.0–17.7)
Immature Grans (Abs): 0 10*3/uL (ref 0.0–0.1)
Immature Granulocytes: 0 %
Lymphocytes Absolute: 1.8 10*3/uL (ref 0.7–3.1)
Lymphs: 23 %
MCH: 30.3 pg (ref 26.6–33.0)
MCHC: 34 g/dL (ref 31.5–35.7)
MCV: 89 fL (ref 79–97)
Monocytes Absolute: 0.8 10*3/uL (ref 0.1–0.9)
Monocytes: 11 %
Neutrophils Absolute: 5 10*3/uL (ref 1.4–7.0)
Neutrophils: 62 %
Platelets: 209 10*3/uL (ref 150–450)
RBC: 5.77 x10E6/uL (ref 4.14–5.80)
RDW: 13.1 % (ref 11.6–15.4)
WBC: 7.9 10*3/uL (ref 3.4–10.8)

## 2019-10-23 LAB — CMP14+EGFR
ALT: 26 IU/L (ref 0–44)
AST: 18 IU/L (ref 0–40)
Albumin/Globulin Ratio: 1.9 (ref 1.2–2.2)
Albumin: 4.5 g/dL (ref 3.8–4.8)
Alkaline Phosphatase: 75 IU/L (ref 48–121)
BUN/Creatinine Ratio: 24 (ref 10–24)
BUN: 29 mg/dL — ABNORMAL HIGH (ref 8–27)
Bilirubin Total: 0.6 mg/dL (ref 0.0–1.2)
CO2: 25 mmol/L (ref 20–29)
Calcium: 9.1 mg/dL (ref 8.6–10.2)
Chloride: 97 mmol/L (ref 96–106)
Creatinine, Ser: 1.22 mg/dL (ref 0.76–1.27)
GFR calc Af Amer: 73 mL/min/{1.73_m2} (ref >59)
GFR calc non Af Amer: 63 mL/min/{1.73_m2} (ref >59)
Globulin, Total: 2.4 g/dL (ref 1.5–4.5)
Glucose: 202 mg/dL — ABNORMAL HIGH (ref 65–99)
Potassium: 5 mmol/L (ref 3.5–5.2)
Sodium: 136 mmol/L (ref 134–144)
Total Protein: 6.9 g/dL (ref 6.0–8.5)

## 2019-10-23 LAB — LIPID PANEL
Chol/HDL Ratio: 2.6 ratio (ref 0.0–5.0)
Cholesterol, Total: 105 mg/dL (ref 100–199)
HDL: 40 mg/dL (ref >39)
LDL Chol Calc (NIH): 43 mg/dL (ref 0–99)
Triglycerides: 124 mg/dL (ref 0–149)
VLDL Cholesterol Cal: 22 mg/dL (ref 5–40)

## 2019-10-23 LAB — TESTOSTERONE,FREE AND TOTAL
Testosterone, Free: 13.5 pg/mL (ref 6.6–18.1)
Testosterone: 265 ng/dL (ref 264–916)

## 2019-10-25 ENCOUNTER — Other Ambulatory Visit: Payer: Self-pay

## 2019-10-25 ENCOUNTER — Encounter: Payer: Self-pay | Admitting: Family Medicine

## 2019-10-25 ENCOUNTER — Ambulatory Visit (INDEPENDENT_AMBULATORY_CARE_PROVIDER_SITE_OTHER): Payer: BC Managed Care – PPO | Admitting: Family Medicine

## 2019-10-25 ENCOUNTER — Ambulatory Visit (INDEPENDENT_AMBULATORY_CARE_PROVIDER_SITE_OTHER): Payer: BC Managed Care – PPO

## 2019-10-25 VITALS — BP 123/72 | HR 69 | Temp 97.8°F | Resp 20 | Ht 72.0 in | Wt 287.5 lb

## 2019-10-25 DIAGNOSIS — M1712 Unilateral primary osteoarthritis, left knee: Secondary | ICD-10-CM | POA: Diagnosis not present

## 2019-10-25 DIAGNOSIS — M25562 Pain in left knee: Secondary | ICD-10-CM | POA: Diagnosis not present

## 2019-10-25 DIAGNOSIS — G8929 Other chronic pain: Secondary | ICD-10-CM

## 2019-10-25 MED ORDER — SILDENAFIL CITRATE 20 MG PO TABS: ORAL_TABLET | ORAL | 5 refills | Status: DC

## 2019-10-25 MED ORDER — OZEMPIC (1 MG/DOSE) 2 MG/1.5ML ~~LOC~~ SOPN: 1.0000 mg | PEN_INJECTOR | SUBCUTANEOUS | 5 refills | Status: DC

## 2019-10-25 MED ORDER — SILDENAFIL CITRATE 20 MG PO TABS
ORAL_TABLET | ORAL | 5 refills | Status: DC
Start: 2019-10-25 — End: 2019-10-25

## 2019-10-25 NOTE — Progress Notes (Signed)
Subjective:  Patient ID: Eugene Bautista,  male    DOB: Sep 21, 1955  Age: 64 y.o.    CC: Medical Management of Chronic Issues   HPI Eugene Bautista presents for  follow-up of hypertension. Patient has no history of headache chest pain or shortness of breath or recent cough. Patient also denies symptoms of TIA such as numbness weakness lateralizing. Patient denies side effects from medication. States taking it regularly.  Patient also  in for follow-up of elevated cholesterol. Doing well without complaints on current medication. Denies side effects  including myalgia and arthralgia and nausea. Also in today for liver function testing. Currently no chest pain, shortness of breath or other cardiovascular related symptoms noted.  Follow-up of diabetes. Patient denies symptoms such as excessive hunger or urinary frequency, excessive hunger, nausea No significant hypoglycemic spells noted. Medications reviewed. Pt reports taking them regularly. Pt. denies complication/adverse reaction today.    History Eugene Bautista has a past medical history of Carotid artery occlusion, Diabetes mellitus without complication ([place]), Hyperlipidemia, Hypertension, Left carotid bruit, and Obesity.   He has a past surgical history that includes Ruptured Aorta Repair (1989); Urinary surgery (1989); and Fracture surgery (Left, 1989).   His family history includes Aneurysm (age of onset: 12) in his sister; Cancer in his brother; Dementia in his mother; Diabetes in his father; Healthy in his daughter; Heart attack (age of onset: 7) in his father; Heart disease in his father; Kidney disease in his father.He reports that he quit smoking about 18 years ago. His smoking use included cigarettes. He has a 43.75 pack-year smoking history. He has never used smokeless tobacco. He reports current alcohol use of about 6.0 standard drinks of alcohol per week. He reports that he does not use drugs.  Current Outpatient  Medications on File Prior to Visit  Medication Sig Dispense Refill   amLODipine (NORVASC) 10 MG tablet TAKE 1 TABLET BY MOUTH  DAILY 90 tablet 3   aspirin 81 MG tablet Take 81 mg by mouth daily.     cholecalciferol (VITAMIN D) 1000 UNITS tablet Take 2,000 Units by mouth daily.     FARXIGA 10 MG TABS tablet TAKE 1 TABLET BY MOUTH  DAILY BEFORE BREAKFAST 90 tablet 1   fish oil-omega-3 fatty acids 1000 MG capsule Take 1 g by mouth 2 (two) times daily.     glipiZIDE (GLUCOTROL) 5 MG tablet TAKE 1 TABLET BY MOUTH IN  THE MORNING 90 tablet 3   metFORMIN (GLUCOPHAGE) 1000 MG tablet Take 1 tablet (1,000 mg total) by mouth 2 (two) times daily with a meal. 60 tablet 0   metoprolol succinate (TOPROL-XL) 25 MG 24 hr tablet TAKE 1 TABLET BY MOUTH  DAILY 90 tablet 3   rosuvastatin (CRESTOR) 10 MG tablet TAKE 1 TABLET BY MOUTH AT  BEDTIME 90 tablet 3   valsartan-hydrochlorothiazide (DIOVAN-HCT) 320-25 MG tablet TAKE 1 TABLET BY MOUTH  DAILY 90 tablet 3   Testosterone 10 MG/ACT (2%) GEL Apply 2 pumps to the inner surface of each thigh daily (four pumps total) (Patient not taking: Reported on 08/01/2019) 180 g 1   No current facility-administered medications on file prior to visit.    ROS Review of Systems  Constitutional: Negative.   HENT: Negative.   Eyes: Negative for visual disturbance.  Respiratory: Negative for cough and shortness of breath.   Cardiovascular: Negative for chest pain and leg swelling.  Gastrointestinal: Negative for abdominal pain, diarrhea, nausea and vomiting.  Genitourinary: Negative for difficulty urinating.  Erectile dysfunction present.  Patient has history of low testosterone.  Prefers not to use testosterone supplements at this time.  He wants to try sildenafil.  Musculoskeletal: Positive for arthralgias (left knee pain). Negative for myalgias.  Skin: Negative for rash.  Neurological: Negative for headaches.  Psychiatric/Behavioral: Negative for sleep  disturbance.    Objective:  BP 123/72    Pulse 69    Temp 97.8 F (36.6 C) (Temporal)    Resp 20    Ht 6' (1.829 m)    Wt 287 lb 8 oz (130.4 kg)    SpO2 94%    BMI 38.99 kg/m   BP Readings from Last 3 Encounters:  10/25/19 123/72  08/01/19 131/67  03/28/18 130/75    Wt Readings from Last 3 Encounters:  10/25/19 287 lb 8 oz (130.4 kg)  08/01/19 291 lb 6 oz (132.2 kg)  03/28/18 290 lb 2 oz (131.6 kg)     Physical Exam Vitals reviewed.  Constitutional:      Appearance: He is well-developed.  HENT:     Head: Normocephalic and atraumatic.     Right Ear: Tympanic membrane and external ear normal. No decreased hearing noted.     Left Ear: Tympanic membrane and external ear normal. No decreased hearing noted.     Mouth/Throat:     Pharynx: No oropharyngeal exudate or posterior oropharyngeal erythema.  Eyes:     Pupils: Pupils are equal, round, and reactive to light.  Cardiovascular:     Rate and Rhythm: Normal rate and regular rhythm.     Heart sounds: No murmur heard.   Pulmonary:     Effort: No respiratory distress.     Breath sounds: Normal breath sounds.  Abdominal:     General: Bowel sounds are normal.     Palpations: Abdomen is soft. There is no mass.     Tenderness: There is no abdominal tenderness.  Musculoskeletal:     Cervical back: Normal range of motion and neck supple.    Results for orders placed or performed in visit on 10/19/19  Testosterone,Free and Total  Result Value Ref Range   Testosterone 265 264 - 916 ng/dL   Testosterone, Free 13.5 6.6 - 18.1 pg/mL  Lipid panel  Result Value Ref Range   Cholesterol, Total 105 100 - 199 mg/dL   Triglycerides 124 0 - 149 mg/dL   HDL 40 >39 mg/dL   VLDL Cholesterol Cal 22 5 - 40 mg/dL   LDL Chol Calc (NIH) 43 0 - 99 mg/dL   Chol/HDL Ratio 2.6 0.0 - 5.0 ratio  CMP14+EGFR  Result Value Ref Range   Glucose 202 (H) 65 - 99 mg/dL   BUN 29 (H) 8 - 27 mg/dL   Creatinine, Ser 1.22 0.76 - 1.27 mg/dL   GFR calc non  Af Amer 63 >59 mL/min/1.73   GFR calc Af Amer 73 >59 mL/min/1.73   BUN/Creatinine Ratio 24 10 - 24   Sodium 136 134 - 144 mmol/L   Potassium 5.0 3.5 - 5.2 mmol/L   Chloride 97 96 - 106 mmol/L   CO2 25 20 - 29 mmol/L   Calcium 9.1 8.6 - 10.2 mg/dL   Total Protein 6.9 6.0 - 8.5 g/dL   Albumin 4.5 3.8 - 4.8 g/dL   Globulin, Total 2.4 1.5 - 4.5 g/dL   Albumin/Globulin Ratio 1.9 1.2 - 2.2   Bilirubin Total 0.6 0.0 - 1.2 mg/dL   Alkaline Phosphatase 75 48 - 121 IU/L   AST 18  0 - 40 IU/L   ALT 26 0 - 44 IU/L  CBC with Differential/Platelet  Result Value Ref Range   WBC 7.9 3.4 - 10.8 x10E3/uL   RBC 5.77 4.14 - 5.80 x10E6/uL   Hemoglobin 17.5 13.0 - 17.7 g/dL   Hematocrit 51.5 (H) 37.5 - 51.0 %   MCV 89 79 - 97 fL   MCH 30.3 26.6 - 33.0 pg   MCHC 34.0 31 - 35 g/dL   RDW 13.1 11.6 - 15.4 %   Platelets 209 150 - 450 x10E3/uL   Neutrophils 62 Not Estab. %   Lymphs 23 Not Estab. %   Monocytes 11 Not Estab. %   Eos 3 Not Estab. %   Basos 1 Not Estab. %   Neutrophils Absolute 5.0 1 - 7 x10E3/uL   Lymphocytes Absolute 1.8 0 - 3 x10E3/uL   Monocytes Absolute 0.8 0 - 0 x10E3/uL   EOS (ABSOLUTE) 0.2 0.0 - 0.4 x10E3/uL   Basophils Absolute 0.0 0 - 0 x10E3/uL   Immature Granulocytes 0 Not Estab. %   Immature Grans (Abs) 0.0 0.0 - 0.1 x10E3/uL  Bayer DCA Hb A1c Waived  Result Value Ref Range   HB A1C (BAYER DCA - WAIVED) 7.5 (H) <7.0 %     Diabetic Foot Exam - Simple   No data filed        Assessment & Plan:   Eugene Bautista was seen today for medical management of chronic issues.  Diagnoses and all orders for this visit:  Chronic pain of left knee -     DG Knee 1-2 Views Left; Future  Other orders -     Discontinue: sildenafil (REVATIO) 20 MG tablet; Take 2-5 pills at once, orally, with each sexual encounter -     Semaglutide, 1 MG/DOSE, (OZEMPIC, 1 MG/DOSE,) 2 MG/1.5ML SOPN; Inject 0.75 mLs (1 mg total) into the skin once a week. -     sildenafil (REVATIO) 20 MG tablet; Take 2-5  pills at once, orally, with each sexual encounter   I have discontinued Luanna Cole. Boothe's Ozempic (0.25 or 0.5 MG/DOSE). I am also having him start on Ozempic (1 MG/DOSE). Additionally, I am having him maintain his aspirin, fish oil-omega-3 fatty acids, cholecalciferol, rosuvastatin, valsartan-hydrochlorothiazide, Testosterone, glipiZIDE, amLODipine, metoprolol succinate, metFORMIN, Farxiga, and sildenafil.  Meds ordered this encounter  Medications   DISCONTD: sildenafil (REVATIO) 20 MG tablet    Sig: Take 2-5 pills at once, orally, with each sexual encounter    Dispense:  50 tablet    Refill:  5   Semaglutide, 1 MG/DOSE, (OZEMPIC, 1 MG/DOSE,) 2 MG/1.5ML SOPN    Sig: Inject 0.75 mLs (1 mg total) into the skin once a week.    Dispense:  3 mL    Refill:  5   sildenafil (REVATIO) 20 MG tablet    Sig: Take 2-5 pills at once, orally, with each sexual encounter    Dispense:  50 tablet    Refill:  5     Follow-up: Return in about 3 months (around 01/25/2020).  Claretta Fraise, M.D.

## 2019-10-31 ENCOUNTER — Telehealth: Payer: Self-pay | Admitting: Family Medicine

## 2019-10-31 MED ORDER — DAPAGLIFLOZIN PROPANEDIOL 10 MG PO TABS: ORAL_TABLET | ORAL | 1 refills | Status: DC

## 2019-10-31 MED ORDER — ROSUVASTATIN CALCIUM 10 MG PO TABS: 10.0000 mg | ORAL_TABLET | Freq: Every day | ORAL | 1 refills | Status: DC

## 2019-10-31 MED ORDER — METFORMIN HCL 1000 MG PO TABS
1000.0000 mg | ORAL_TABLET | Freq: Two times a day (BID) | ORAL | 1 refills | Status: DC
Start: 2019-10-31 — End: 2020-05-01

## 2019-10-31 MED ORDER — VALSARTAN-HYDROCHLOROTHIAZIDE 320-25 MG PO TABS: 1.0000 | ORAL_TABLET | Freq: Every day | ORAL | 1 refills | Status: DC

## 2019-10-31 MED ORDER — OZEMPIC (1 MG/DOSE) 2 MG/1.5ML ~~LOC~~ SOPN: 1.0000 mg | PEN_INJECTOR | SUBCUTANEOUS | 5 refills | Status: DC

## 2019-10-31 MED ORDER — AMLODIPINE BESYLATE 10 MG PO TABS
10.0000 mg | ORAL_TABLET | Freq: Every day | ORAL | 1 refills | Status: DC
Start: 2019-10-31 — End: 2020-05-01

## 2019-10-31 MED ORDER — GLIPIZIDE 5 MG PO TABS
5.0000 mg | ORAL_TABLET | Freq: Every morning | ORAL | 1 refills | Status: DC
Start: 2019-10-31 — End: 2020-05-01

## 2019-10-31 MED ORDER — METOPROLOL SUCCINATE ER 25 MG PO TB24
25.0000 mg | ORAL_TABLET | Freq: Every day | ORAL | 1 refills | Status: DC
Start: 2019-10-31 — End: 2020-05-01

## 2019-10-31 NOTE — Telephone Encounter (Signed)
Patient states his medications were sent into the wrong pharmacy.  Re [facility] per patient request.

## 2019-10-31 NOTE — Telephone Encounter (Signed)
Pt requesting a call after lunch to discuss medication questions.

## 2019-11-14 ENCOUNTER — Telehealth: Payer: Self-pay | Admitting: Family Medicine

## 2019-11-14 MED ORDER — OZEMPIC (1 MG/DOSE) 2 MG/1.5ML ~~LOC~~ SOPN: 1.0000 mg | PEN_INJECTOR | SUBCUTANEOUS | 1 refills | Status: DC

## 2019-11-14 NOTE — Telephone Encounter (Signed)
Patient called needing his ozempic upped for him to get 90 days. Advised patient would send updated Rx to mail order.

## 2019-11-16 ENCOUNTER — Telehealth: Payer: Self-pay | Admitting: Family Medicine

## 2019-11-16 MED ORDER — OZEMPIC (1 MG/DOSE) 2 MG/1.5ML ~~LOC~~ SOPN: 1.0000 mg | PEN_INJECTOR | SUBCUTANEOUS | 0 refills | Status: DC

## 2019-11-16 NOTE — Telephone Encounter (Signed)
°  Prescription Request  11/16/2019  What is the name of the medication or equipment? OZEMPIC .75 was called in for 30 day supply and he needs 90 day supply  Have you contacted your pharmacy to request a refill? (if applicable) yes  Which pharmacy would you like this sent to? [facility]   Patient notified that their request is being sent to the clinical staff for review and that they should receive a response within 2 business days.

## 2019-11-16 NOTE — Telephone Encounter (Signed)
Pt aware refill corrected for a 90d supply sent to mail order Also encouraged him to schedule his 6 mos appt for November

## 2020-01-18 ENCOUNTER — Other Ambulatory Visit: Payer: Self-pay | Admitting: Family Medicine

## 2020-01-24 ENCOUNTER — Other Ambulatory Visit: Payer: BC Managed Care – PPO

## 2020-01-24 ENCOUNTER — Other Ambulatory Visit: Payer: Self-pay

## 2020-01-24 DIAGNOSIS — E1169 Type 2 diabetes mellitus with other specified complication: Secondary | ICD-10-CM

## 2020-01-24 DIAGNOSIS — Z794 Long term (current) use of insulin: Secondary | ICD-10-CM | POA: Diagnosis not present

## 2020-01-24 DIAGNOSIS — E1121 Type 2 diabetes mellitus with diabetic nephropathy: Secondary | ICD-10-CM | POA: Diagnosis not present

## 2020-01-24 DIAGNOSIS — E291 Testicular hypofunction: Secondary | ICD-10-CM

## 2020-01-24 DIAGNOSIS — E785 Hyperlipidemia, unspecified: Secondary | ICD-10-CM | POA: Diagnosis not present

## 2020-01-24 LAB — BAYER DCA HB A1C WAIVED: HB A1C (BAYER DCA - WAIVED): 7.9 % — ABNORMAL HIGH (ref <7.0)

## 2020-01-25 LAB — CBC WITH DIFFERENTIAL/PLATELET
Basophils Absolute: 0 10*3/uL (ref 0.0–0.2)
Basos: 1 %
EOS (ABSOLUTE): 0.3 10*3/uL (ref 0.0–0.4)
Eos: 3 %
Hematocrit: 52.8 % — ABNORMAL HIGH (ref 37.5–51.0)
Hemoglobin: 17.5 g/dL (ref 13.0–17.7)
Immature Grans (Abs): 0 10*3/uL (ref 0.0–0.1)
Immature Granulocytes: 0 %
Lymphocytes Absolute: 1.7 10*3/uL (ref 0.7–3.1)
Lymphs: 21 %
MCH: 30.4 pg (ref 26.6–33.0)
MCHC: 33.1 g/dL (ref 31.5–35.7)
MCV: 92 fL (ref 79–97)
Monocytes Absolute: 0.8 10*3/uL (ref 0.1–0.9)
Monocytes: 11 %
Neutrophils Absolute: 5.1 10*3/uL (ref 1.4–7.0)
Neutrophils: 64 %
Platelets: 190 10*3/uL (ref 150–450)
RBC: 5.76 x10E6/uL (ref 4.14–5.80)
RDW: 12.8 % (ref 11.6–15.4)
WBC: 8 10*3/uL (ref 3.4–10.8)

## 2020-01-25 LAB — TESTOSTERONE,FREE AND TOTAL
Testosterone, Free: 20 pg/mL — ABNORMAL HIGH (ref 6.6–18.1)
Testosterone: 214 ng/dL — ABNORMAL LOW (ref 264–916)

## 2020-01-25 LAB — CMP14+EGFR
ALT: 28 IU/L (ref 0–44)
AST: 17 IU/L (ref 0–40)
Albumin/Globulin Ratio: 2 (ref 1.2–2.2)
Albumin: 4.7 g/dL (ref 3.8–4.8)
Alkaline Phosphatase: 77 IU/L (ref 44–121)
BUN/Creatinine Ratio: 13 (ref 10–24)
BUN: 15 mg/dL (ref 8–27)
Bilirubin Total: 0.7 mg/dL (ref 0.0–1.2)
CO2: 28 mmol/L (ref 20–29)
Calcium: 9.5 mg/dL (ref 8.6–10.2)
Chloride: 97 mmol/L (ref 96–106)
Creatinine, Ser: 1.17 mg/dL (ref 0.76–1.27)
GFR calc Af Amer: 76 mL/min/{1.73_m2} (ref >59)
GFR calc non Af Amer: 66 mL/min/{1.73_m2} (ref >59)
Globulin, Total: 2.4 g/dL (ref 1.5–4.5)
Glucose: 215 mg/dL — ABNORMAL HIGH (ref 65–99)
Potassium: 5 mmol/L (ref 3.5–5.2)
Sodium: 139 mmol/L (ref 134–144)
Total Protein: 7.1 g/dL (ref 6.0–8.5)

## 2020-01-25 LAB — LIPID PANEL
Chol/HDL Ratio: 2.5 ratio (ref 0.0–5.0)
Cholesterol, Total: 112 mg/dL (ref 100–199)
HDL: 45 mg/dL (ref >39)
LDL Chol Calc (NIH): 42 mg/dL (ref 0–99)
Triglycerides: 150 mg/dL — ABNORMAL HIGH (ref 0–149)
VLDL Cholesterol Cal: 25 mg/dL (ref 5–40)

## 2020-01-29 ENCOUNTER — Encounter: Payer: Self-pay | Admitting: Family Medicine

## 2020-01-29 ENCOUNTER — Ambulatory Visit: Payer: BC Managed Care – PPO | Admitting: Family Medicine

## 2020-01-29 ENCOUNTER — Other Ambulatory Visit: Payer: Self-pay

## 2020-01-29 VITALS — BP 129/63 | HR 73 | Temp 97.3°F | Resp 20 | Ht 72.0 in | Wt 287.5 lb

## 2020-01-29 DIAGNOSIS — I10 Essential (primary) hypertension: Secondary | ICD-10-CM

## 2020-01-29 DIAGNOSIS — E291 Testicular hypofunction: Secondary | ICD-10-CM

## 2020-01-29 DIAGNOSIS — E1121 Type 2 diabetes mellitus with diabetic nephropathy: Secondary | ICD-10-CM

## 2020-01-29 DIAGNOSIS — E1169 Type 2 diabetes mellitus with other specified complication: Secondary | ICD-10-CM | POA: Diagnosis not present

## 2020-01-29 DIAGNOSIS — Z1211 Encounter for screening for malignant neoplasm of colon: Secondary | ICD-10-CM

## 2020-01-29 DIAGNOSIS — Z794 Long term (current) use of insulin: Secondary | ICD-10-CM

## 2020-01-29 DIAGNOSIS — E785 Hyperlipidemia, unspecified: Secondary | ICD-10-CM

## 2020-01-29 MED ORDER — OZEMPIC (1 MG/DOSE) 4 MG/3ML ~~LOC~~ SOPN
PEN_INJECTOR | SUBCUTANEOUS | 0 refills | Status: DC
Start: 2020-01-29 — End: 2020-12-05

## 2020-01-29 NOTE — Progress Notes (Signed)
Subjective:  Patient ID: Eugene Bautista,  male    DOB: 1956-02-06  Age: 64 y.o.    CC: Medical Management of Chronic Issues   HPI Eugene Bautista presents for  follow-up of hypertension. Patient has no history of headache chest pain or shortness of breath or recent cough. Patient also denies symptoms of TIA such as numbness weakness lateralizing. Patient denies side effects from medication. States taking it regularly.  Patient also  in for follow-up of elevated cholesterol. Doing well without complaints on current medication. Denies side effects  including myalgia and arthralgia and nausea. Also in today for liver function testing. Currently no chest pain, shortness of breath or other cardiovascular related symptoms noted.  Follow-up of diabetes. Patient denies symptoms such as excessive hunger or urinary frequency, excessive hunger, nausea No significant hypoglycemic spells noted. Medications reviewed. Pt reports taking them regularly. Pt. denies complication/adverse reaction today.    History Eugene Bautista has a past medical history of Carotid artery occlusion, Diabetes mellitus without complication ([place]), Hyperlipidemia, Hypertension, Left carotid bruit, and Obesity.   Eugene Bautista has a past surgical history that includes Ruptured Aorta Repair (1989); Urinary surgery (1989); and Fracture surgery (Left, 1989).   His family history includes Aneurysm (age of onset: 77) in his sister; Cancer in his brother; Dementia in his mother; Diabetes in his father; Healthy in his daughter; Heart attack (age of onset: 12) in his father; Heart disease in his father; Kidney disease in his father.Eugene Bautista reports that Eugene Bautista quit smoking about 18 years ago. His smoking use included cigarettes. Eugene Bautista has a 43.75 pack-year smoking history. Eugene Bautista has never used smokeless tobacco. Eugene Bautista reports current alcohol use of about 6.0 standard drinks of alcohol per week. Eugene Bautista reports that Eugene Bautista does not use drugs.  Current Outpatient  Medications on File Prior to Visit  Medication Sig Dispense Refill  . amLODipine (NORVASC) 10 MG tablet Take 1 tablet (10 mg total) by mouth daily. 90 tablet 1  . aspirin 81 MG tablet Take 81 mg by mouth daily.    . cholecalciferol (VITAMIN D) 1000 UNITS tablet Take 2,000 Units by mouth daily.    . dapagliflozin propanediol (FARXIGA) 10 MG TABS tablet TAKE 1 TABLET BY MOUTH  DAILY BEFORE BREAKFAST 90 tablet 1  . fish oil-omega-3 fatty acids 1000 MG capsule Take 1 g by mouth 2 (two) times daily.    Marland Kitchen glipiZIDE (GLUCOTROL) 5 MG tablet Take 1 tablet (5 mg total) by mouth every morning. 90 tablet 1  . metFORMIN (GLUCOPHAGE) 1000 MG tablet Take 1 tablet (1,000 mg total) by mouth 2 (two) times daily with a meal. 180 tablet 1  . metoprolol succinate (TOPROL-XL) 25 MG 24 hr tablet Take 1 tablet (25 mg total) by mouth daily. 90 tablet 1  . rosuvastatin (CRESTOR) 10 MG tablet Take 1 tablet (10 mg total) by mouth at bedtime. 90 tablet 1  . sildenafil (REVATIO) 20 MG tablet Take 2-5 pills at once, orally, with each sexual encounter 50 tablet 5  . valsartan-hydrochlorothiazide (DIOVAN-HCT) 320-25 MG tablet Take 1 tablet by mouth daily. 90 tablet 1   No current facility-administered medications on file prior to visit.    ROS Review of Systems  Constitutional: Negative.   HENT: Negative.   Eyes: Negative for visual disturbance.  Respiratory: Negative for cough and shortness of breath.   Cardiovascular: Negative for chest pain and leg swelling.  Gastrointestinal: Negative for abdominal pain, diarrhea, nausea and vomiting.  Genitourinary: Negative for difficulty urinating.  Musculoskeletal:  Negative for arthralgias and myalgias.  Skin: Negative for rash.  Neurological: Negative for headaches.  Psychiatric/Behavioral: Negative for sleep disturbance.    Objective:  BP 129/63   Pulse 73   Temp (!) 97.3 F (36.3 C) (Temporal)   Resp 20   Ht 6' (1.829 m)   Wt 287 lb 8 oz (130.4 kg)   SpO2 97%    BMI 38.99 kg/m   BP Readings from Last 3 Encounters:  01/29/20 129/63  10/25/19 123/72  08/01/19 131/67    Wt Readings from Last 3 Encounters:  01/29/20 287 lb 8 oz (130.4 kg)  10/25/19 287 lb 8 oz (130.4 kg)  08/01/19 291 lb 6 oz (132.2 kg)     Physical Exam Vitals reviewed.  Constitutional:      Appearance: Eugene Bautista is well-developed.  HENT:     Head: Normocephalic and atraumatic.     Right Ear: Tympanic membrane and external ear normal. No decreased hearing noted.     Left Ear: Tympanic membrane and external ear normal. No decreased hearing noted.     Mouth/Throat:     Pharynx: No oropharyngeal exudate or posterior oropharyngeal erythema.  Eyes:     Pupils: Pupils are equal, round, and reactive to light.  Cardiovascular:     Rate and Rhythm: Normal rate and regular rhythm.     Heart sounds: No murmur heard.   Pulmonary:     Effort: No respiratory distress.     Breath sounds: Normal breath sounds.  Abdominal:     General: Bowel sounds are normal.     Palpations: Abdomen is soft. There is no mass.     Tenderness: There is no abdominal tenderness.  Musculoskeletal:     Cervical back: Normal range of motion and neck supple.    Results for orders placed or performed in visit on 01/24/20  Testosterone,Free and Total  Result Value Ref Range   Testosterone 214 (L) 264 - 916 ng/dL   Testosterone, Free 20.0 (H) 6.6 - 18.1 pg/mL  Lipid panel  Result Value Ref Range   Cholesterol, Total 112 100 - 199 mg/dL   Triglycerides 150 (H) 0 - 149 mg/dL   HDL 45 >39 mg/dL   VLDL Cholesterol Cal 25 5 - 40 mg/dL   LDL Chol Calc (NIH) 42 0 - 99 mg/dL   Chol/HDL Ratio 2.5 0.0 - 5.0 ratio  CMP14+EGFR  Result Value Ref Range   Glucose 215 (H) 65 - 99 mg/dL   BUN 15 8 - 27 mg/dL   Creatinine, Ser 1.17 0.76 - 1.27 mg/dL   GFR calc non Af Amer 66 >59 mL/min/1.73   GFR calc Af Amer 76 >59 mL/min/1.73   BUN/Creatinine Ratio 13 10 - 24   Sodium 139 134 - 144 mmol/L   Potassium 5.0 3.5 -  5.2 mmol/L   Chloride 97 96 - 106 mmol/L   CO2 28 20 - 29 mmol/L   Calcium 9.5 8.6 - 10.2 mg/dL   Total Protein 7.1 6.0 - 8.5 g/dL   Albumin 4.7 3.8 - 4.8 g/dL   Globulin, Total 2.4 1.5 - 4.5 g/dL   Albumin/Globulin Ratio 2.0 1.2 - 2.2   Bilirubin Total 0.7 0.0 - 1.2 mg/dL   Alkaline Phosphatase 77 44 - 121 IU/L   AST 17 0 - 40 IU/L   ALT 28 0 - 44 IU/L  CBC with Differential/Platelet  Result Value Ref Range   WBC 8.0 3.4 - 10.8 x10E3/uL   RBC 5.76 4.14 - 5.80  x10E6/uL   Hemoglobin 17.5 13.0 - 17.7 g/dL   Hematocrit 52.8 (H) 37.5 - 51.0 %   MCV 92 79 - 97 fL   MCH 30.4 26.6 - 33.0 pg   MCHC 33.1 31 - 35 g/dL   RDW 12.8 11.6 - 15.4 %   Platelets 190 150 - 450 x10E3/uL   Neutrophils 64 Not Estab. %   Lymphs 21 Not Estab. %   Monocytes 11 Not Estab. %   Eos 3 Not Estab. %   Basos 1 Not Estab. %   Neutrophils Absolute 5.1 1.40 - 7.00 x10E3/uL   Lymphocytes Absolute 1.7 0 - 3 x10E3/uL   Monocytes Absolute 0.8 0 - 0 x10E3/uL   EOS (ABSOLUTE) 0.3 0.0 - 0.4 x10E3/uL   Basophils Absolute 0.0 0 - 0 x10E3/uL   Immature Granulocytes 0 Not Estab. %   Immature Grans (Abs) 0.0 0.0 - 0.1 x10E3/uL  Bayer DCA Hb A1c Waived  Result Value Ref Range   HB A1C (BAYER DCA - WAIVED) 7.9 (H) <7.0 %       Assessment & Plan:   Nycere was seen today for medical management of chronic issues.  Diagnoses and all orders for this visit:  Type 2 diabetes mellitus with diabetic nephropathy, with long-term current use of insulin ([place])  Dyslipidemia associated with type 2 diabetes mellitus ([place])  Essential hypertension  Hypogonadism, testicular  Screen for colon cancer -     Ambulatory referral to Gastroenterology  Other orders -     Semaglutide, 1 MG/DOSE, (OZEMPIC, 1 MG/DOSE,) 4 MG/3ML SOPN; INJECT SUBCUTANEOUSLY 1 MG  WEEKLY   I have discontinued Gwyndolyn Saxon C. Seymore's Testosterone. I have also changed his Ozempic (1 MG/DOSE). Additionally, I am having him maintain his aspirin, fish  oil-omega-3 fatty acids, cholecalciferol, sildenafil, amLODipine, dapagliflozin propanediol, glipiZIDE, metFORMIN, metoprolol succinate, rosuvastatin, and valsartan-hydrochlorothiazide.  Meds ordered this encounter  Medications  . Semaglutide, 1 MG/DOSE, (OZEMPIC, 1 MG/DOSE,) 4 MG/3ML SOPN    Sig: INJECT SUBCUTANEOUSLY 1 MG  WEEKLY    Dispense:  9 mL    Refill:  0   Lab work was reviewed with the patient. Due to his obesity plus his elevated A1c, semaglutide was added to his regimen. Follow-up: Return in about 6 weeks (around 03/11/2020).  Claretta Fraise, M.D.

## 2020-04-25 ENCOUNTER — Telehealth: Payer: Self-pay

## 2020-04-25 NOTE — Telephone Encounter (Signed)
Patient would like to do labs before appt but states that he works 5 days a week now until 5 or 5:30 and can't come in before appt.

## 2020-04-27 ENCOUNTER — Other Ambulatory Visit: Payer: Self-pay | Admitting: Family Medicine

## 2020-05-01 ENCOUNTER — Encounter: Payer: Self-pay | Admitting: Family Medicine

## 2020-05-01 ENCOUNTER — Ambulatory Visit: Payer: BC Managed Care – PPO | Admitting: Family Medicine

## 2020-05-01 ENCOUNTER — Other Ambulatory Visit: Payer: Self-pay

## 2020-05-01 VITALS — BP 132/75 | HR 76 | Temp 98.3°F | Resp 20 | Ht 72.0 in | Wt 282.1 lb

## 2020-05-01 DIAGNOSIS — E1121 Type 2 diabetes mellitus with diabetic nephropathy: Secondary | ICD-10-CM

## 2020-05-01 DIAGNOSIS — E1169 Type 2 diabetes mellitus with other specified complication: Secondary | ICD-10-CM | POA: Diagnosis not present

## 2020-05-01 DIAGNOSIS — E785 Hyperlipidemia, unspecified: Secondary | ICD-10-CM | POA: Diagnosis not present

## 2020-05-01 DIAGNOSIS — I10 Essential (primary) hypertension: Secondary | ICD-10-CM

## 2020-05-01 DIAGNOSIS — Z794 Long term (current) use of insulin: Secondary | ICD-10-CM | POA: Diagnosis not present

## 2020-05-01 LAB — CBC WITH DIFFERENTIAL/PLATELET
Basophils Absolute: 0 10*3/uL (ref 0.0–0.2)
Basos: 1 %
EOS (ABSOLUTE): 0.2 10*3/uL (ref 0.0–0.4)
Eos: 3 %
Hematocrit: 51.4 % — ABNORMAL HIGH (ref 37.5–51.0)
Hemoglobin: 17.3 g/dL (ref 13.0–17.7)
Immature Grans (Abs): 0 10*3/uL (ref 0.0–0.1)
Immature Granulocytes: 0 %
Lymphocytes Absolute: 1.6 10*3/uL (ref 0.7–3.1)
Lymphs: 21 %
MCH: 30.1 pg (ref 26.6–33.0)
MCHC: 33.7 g/dL (ref 31.5–35.7)
MCV: 90 fL (ref 79–97)
Monocytes Absolute: 0.9 10*3/uL (ref 0.1–0.9)
Monocytes: 12 %
Neutrophils Absolute: 4.9 10*3/uL (ref 1.4–7.0)
Neutrophils: 63 %
Platelets: 191 10*3/uL (ref 150–450)
RBC: 5.74 x10E6/uL (ref 4.14–5.80)
RDW: 13.2 % (ref 11.6–15.4)
WBC: 7.8 10*3/uL (ref 3.4–10.8)

## 2020-05-01 LAB — CMP14+EGFR
ALT: 27 IU/L (ref 0–44)
AST: 16 IU/L (ref 0–40)
Albumin/Globulin Ratio: 1.8 (ref 1.2–2.2)
Albumin: 4.6 g/dL (ref 3.8–4.8)
Alkaline Phosphatase: 79 IU/L (ref 44–121)
BUN/Creatinine Ratio: 21 (ref 10–24)
BUN: 28 mg/dL — ABNORMAL HIGH (ref 8–27)
Bilirubin Total: 0.8 mg/dL (ref 0.0–1.2)
CO2: 24 mmol/L (ref 20–29)
Calcium: 9.7 mg/dL (ref 8.6–10.2)
Chloride: 100 mmol/L (ref 96–106)
Creatinine, Ser: 1.33 mg/dL — ABNORMAL HIGH (ref 0.76–1.27)
GFR calc Af Amer: 65 mL/min/{1.73_m2} (ref >59)
GFR calc non Af Amer: 56 mL/min/{1.73_m2} — ABNORMAL LOW (ref >59)
Globulin, Total: 2.6 g/dL (ref 1.5–4.5)
Glucose: 177 mg/dL — ABNORMAL HIGH (ref 65–99)
Potassium: 5 mmol/L (ref 3.5–5.2)
Sodium: 139 mmol/L (ref 134–144)
Total Protein: 7.2 g/dL (ref 6.0–8.5)

## 2020-05-01 LAB — LIPID PANEL
Chol/HDL Ratio: 2.8 ratio (ref 0.0–5.0)
Cholesterol, Total: 111 mg/dL (ref 100–199)
HDL: 39 mg/dL — ABNORMAL LOW (ref >39)
LDL Chol Calc (NIH): 42 mg/dL (ref 0–99)
Triglycerides: 182 mg/dL — ABNORMAL HIGH (ref 0–149)
VLDL Cholesterol Cal: 30 mg/dL (ref 5–40)

## 2020-05-01 LAB — BAYER DCA HB A1C WAIVED: HB A1C (BAYER DCA - WAIVED): 7.8 % — ABNORMAL HIGH (ref <7.0)

## 2020-05-01 MED ORDER — AMLODIPINE BESYLATE 10 MG PO TABS: 10.0000 mg | ORAL_TABLET | Freq: Every day | ORAL | 1 refills | Status: DC

## 2020-05-01 MED ORDER — GLIPIZIDE 5 MG PO TABS: 5.0000 mg | ORAL_TABLET | Freq: Every morning | ORAL | 1 refills | Status: DC

## 2020-05-01 MED ORDER — DAPAGLIFLOZIN PROPANEDIOL 10 MG PO TABS
ORAL_TABLET | ORAL | 1 refills | Status: DC
Start: 2020-05-01 — End: 2020-09-17

## 2020-05-01 MED ORDER — METFORMIN HCL 1000 MG PO TABS: 1000.0000 mg | ORAL_TABLET | Freq: Two times a day (BID) | ORAL | 1 refills | Status: DC

## 2020-05-01 MED ORDER — TRULICITY 3 MG/0.5ML ~~LOC~~ SOAJ: 3.0000 mg | SUBCUTANEOUS | 3 refills | Status: DC

## 2020-05-01 MED ORDER — VALSARTAN-HYDROCHLOROTHIAZIDE 320-25 MG PO TABS: 1.0000 | ORAL_TABLET | Freq: Every day | ORAL | 1 refills | Status: DC

## 2020-05-01 MED ORDER — METOPROLOL SUCCINATE ER 25 MG PO TB24: 25.0000 mg | ORAL_TABLET | Freq: Every day | ORAL | 1 refills | Status: DC

## 2020-05-01 NOTE — Patient Instructions (Signed)

## 2020-05-01 NOTE — Progress Notes (Signed)
Subjective:  Patient ID: Eugene Bautista, male    DOB: [birth year]  Age: 65 y.o. MRN: [id number]  CC: Medical Management of Chronic Issues   HPI Eugene Bautista presents forFollow-up of diabetes. Patient checks blood sugar at home.   around 180  fasting and not checking postprandial Patient denies symptoms such as polyuria, polydipsia, excessive hunger, nausea No significant hypoglycemic spells noted. Medications reviewed. Pt reports taking them regularly without complication/adverse reaction being reported today.  Checking feet daily.   presents for  follow-up of hypertension. Patient has no history of headache chest pain or shortness of breath or recent cough. Patient also denies symptoms of TIA such as focal numbness or weakness. Patient denies side effects from medication. States taking it regularly.   in for follow-up of elevated cholesterol. Doing well without complaints on current medication. Denies side effects of statin including myalgia and arthralgia and nausea. Currently no chest pain, shortness of breath or other cardiovascular related symptoms noted.   History Eugene Bautista has a past medical history of Carotid artery occlusion, Diabetes mellitus without complication ([place]), Hyperlipidemia, Hypertension, Left carotid bruit, and Obesity.   He has a past surgical history that includes Ruptured Aorta Repair (1989); Urinary surgery (1989); and Fracture surgery (Left, 1989).   His family history includes Aneurysm (age of onset: 42) in his sister; Cancer in his brother; Dementia in his mother; Diabetes in his father; Healthy in his daughter; Heart attack (age of onset: 43) in his father; Heart disease in his father; Kidney disease in his father.He reports that he quit smoking about 19 years ago. His smoking use included cigarettes. He has a 43.75 pack-year smoking history. He has never used smokeless tobacco. He reports current alcohol use of about 6.0 standard drinks of alcohol per  week. He reports that he does not use drugs.  Current Outpatient Medications on File Prior to Visit  Medication Sig Dispense Refill  . aspirin 81 MG tablet Take 81 mg by mouth daily.    . cholecalciferol (VITAMIN D) 1000 UNITS tablet Take 2,000 Units by mouth daily.    . fish oil-omega-3 fatty acids 1000 MG capsule Take 1 g by mouth 2 (two) times daily.    . rosuvastatin (CRESTOR) 10 MG tablet TAKE 1 TABLET BY MOUTH AT  BEDTIME 90 tablet 0  . Semaglutide, 1 MG/DOSE, (OZEMPIC, 1 MG/DOSE,) 4 MG/3ML SOPN INJECT SUBCUTANEOUSLY 1 MG  WEEKLY 9 mL 0  . sildenafil (REVATIO) 20 MG tablet Take 2-5 pills at once, orally, with each sexual encounter 50 tablet 5   No current facility-administered medications on file prior to visit.    ROS Review of Systems  Constitutional: Negative for fever.  Respiratory: Negative for shortness of breath.   Cardiovascular: Negative for chest pain.  Musculoskeletal: Negative for arthralgias.  Skin: Negative for rash.    Objective:  BP 132/75   Pulse 76   Temp 98.3 F (36.8 C) (Temporal)   Resp 20   Ht 6' (1.829 m)   Wt 282 lb 2 oz (128 kg)   SpO2 98%   BMI 38.26 kg/m   BP Readings from Last 3 Encounters:  05/01/20 132/75  01/29/20 129/63  10/25/19 123/72    Wt Readings from Last 3 Encounters:  05/01/20 282 lb 2 oz (128 kg)  01/29/20 287 lb 8 oz (130.4 kg)  10/25/19 287 lb 8 oz (130.4 kg)     Physical Exam Vitals reviewed.  Constitutional:      Appearance: He is well-developed  and well-nourished.  HENT:     Head: Normocephalic and atraumatic.     Right Ear: Tympanic membrane and external ear normal. No decreased hearing noted.     Left Ear: Tympanic membrane and external ear normal. No decreased hearing noted.     Mouth/Throat:     Pharynx: No oropharyngeal exudate or posterior oropharyngeal erythema.  Eyes:     Pupils: Pupils are equal, round, and reactive to light.  Cardiovascular:     Rate and Rhythm: Normal rate and regular rhythm.      Heart sounds: No murmur heard.   Pulmonary:     Effort: No respiratory distress.     Breath sounds: Normal breath sounds.  Abdominal:     General: Bowel sounds are normal.     Palpations: Abdomen is soft. There is no mass.     Tenderness: There is no abdominal tenderness.  Musculoskeletal:     Cervical back: Normal range of motion and neck supple.       Assessment & Plan:   Eugene Bautista was seen today for medical management of chronic issues.  Diagnoses and all orders for this visit:  Type 2 diabetes mellitus with diabetic nephropathy, with long-term current use of insulin ([facility]) -     Bayer DCA Hb A1c Waived -     CBC with Differential/Platelet -     CMP14+EGFR -     Microalbumin / creatinine urine ratio -     Lipid panel -     metFORMIN (GLUCOPHAGE) 1000 MG tablet; Take 1 tablet (1,000 mg total) by mouth 2 (two) times daily with a meal. -     glipiZIDE (GLUCOTROL) 5 MG tablet; Take 1 tablet (5 mg total) by mouth every morning. -     dapagliflozin propanediol (FARXIGA) 10 MG TABS tablet; TAKE 1 TABLET BY MOUTH  DAILY BEFORE BREAKFAST -     Dulaglutide (TRULICITY) 3 DG/6.4QI SOPN; Inject 3 mg into the skin once a week.  Dyslipidemia associated with type 2 diabetes mellitus ([facility]) -     CBC with Differential/Platelet -     CMP14+EGFR -     Lipid panel  Essential hypertension -     CBC with Differential/Platelet -     CMP14+EGFR -     Lipid panel -     valsartan-hydrochlorothiazide (DIOVAN-HCT) 320-25 MG tablet; Take 1 tablet by mouth daily. -     metoprolol succinate (TOPROL-XL) 25 MG 24 hr tablet; Take 1 tablet (25 mg total) by mouth daily. -     amLODipine (NORVASC) 10 MG tablet; Take 1 tablet (10 mg total) by mouth daily.      I am having Eugene Bautista. Eugene Bautista start on Trulicity. I am also having him maintain his aspirin, fish oil-omega-3 fatty acids, cholecalciferol, sildenafil, Ozempic (1 MG/DOSE), rosuvastatin, valsartan-hydrochlorothiazide, metoprolol succinate,  metFORMIN, glipiZIDE, dapagliflozin propanediol, and amLODipine.  Meds ordered this encounter  Medications  . valsartan-hydrochlorothiazide (DIOVAN-HCT) 320-25 MG tablet    Sig: Take 1 tablet by mouth daily.    Dispense:  90 tablet    Refill:  1    Requesting 1 year supply  . metoprolol succinate (TOPROL-XL) 25 MG 24 hr tablet    Sig: Take 1 tablet (25 mg total) by mouth daily.    Dispense:  90 tablet    Refill:  1    Requesting 1 year supply  . metFORMIN (GLUCOPHAGE) 1000 MG tablet    Sig: Take 1 tablet (1,000 mg total) by mouth 2 (two) times  daily with a meal.    Dispense:  180 tablet    Refill:  1  . glipiZIDE (GLUCOTROL) 5 MG tablet    Sig: Take 1 tablet (5 mg total) by mouth every morning.    Dispense:  90 tablet    Refill:  1    Requesting 1 year supply  . dapagliflozin propanediol (FARXIGA) 10 MG TABS tablet    Sig: TAKE 1 TABLET BY MOUTH  DAILY BEFORE BREAKFAST    Dispense:  90 tablet    Refill:  1  . amLODipine (NORVASC) 10 MG tablet    Sig: Take 1 tablet (10 mg total) by mouth daily.    Dispense:  90 tablet    Refill:  1    Requesting 1 year supply  . Dulaglutide (TRULICITY) 3 PF/2.9WK SOPN    Sig: Inject 3 mg into the skin once a week.    Dispense:  2 mL    Refill:  3     Follow-up: No follow-ups on file.  Claretta Fraise, M.D.

## 2020-05-02 LAB — MICROALBUMIN / CREATININE URINE RATIO
Creatinine, Urine: 61.7 mg/dL
Microalb/Creat Ratio: 9 mg/g creat (ref 0–29)
Microalbumin, Urine: 5.7 ug/mL

## 2020-05-04 ENCOUNTER — Encounter: Payer: Self-pay | Admitting: Family Medicine

## 2020-05-05 NOTE — Progress Notes (Signed)
Hello Elishua,  Your lab result is normal and/or stable.Some minor variations that are not significant are commonly marked abnormal, but do not represent any medical problem for you.  Best regards, Moya Duan, M.D.

## 2020-05-06 ENCOUNTER — Telehealth: Payer: Self-pay

## 2020-05-06 DIAGNOSIS — E1121 Type 2 diabetes mellitus with diabetic nephropathy: Secondary | ICD-10-CM

## 2020-05-06 MED ORDER — TRULICITY 3 MG/0.5ML ~~LOC~~ SOAJ: 3.0000 mg | SUBCUTANEOUS | 3 refills | Status: DC

## 2020-05-06 NOTE — Telephone Encounter (Signed)
Patient called requesting that trulicity be sent to mail order for 90 days instead of 30. According to chart and dosing it was sent for 90 days. Resent to mail order just to be sure.

## 2020-06-24 ENCOUNTER — Encounter: Payer: Self-pay | Admitting: *Deleted

## 2020-07-04 ENCOUNTER — Other Ambulatory Visit: Payer: Self-pay | Admitting: Family Medicine

## 2020-08-06 ENCOUNTER — Ambulatory Visit: Payer: BC Managed Care – PPO | Admitting: Family Medicine

## 2020-08-14 ENCOUNTER — Other Ambulatory Visit: Payer: Self-pay | Admitting: *Deleted

## 2020-08-14 ENCOUNTER — Other Ambulatory Visit: Payer: Self-pay

## 2020-08-14 ENCOUNTER — Other Ambulatory Visit: Payer: BC Managed Care – PPO

## 2020-08-14 DIAGNOSIS — E1121 Type 2 diabetes mellitus with diabetic nephropathy: Secondary | ICD-10-CM

## 2020-08-14 DIAGNOSIS — Z794 Long term (current) use of insulin: Secondary | ICD-10-CM | POA: Diagnosis not present

## 2020-08-14 DIAGNOSIS — E1169 Type 2 diabetes mellitus with other specified complication: Secondary | ICD-10-CM

## 2020-08-14 DIAGNOSIS — E785 Hyperlipidemia, unspecified: Secondary | ICD-10-CM | POA: Diagnosis not present

## 2020-08-14 DIAGNOSIS — E291 Testicular hypofunction: Secondary | ICD-10-CM | POA: Diagnosis not present

## 2020-08-14 LAB — BAYER DCA HB A1C WAIVED: HB A1C (BAYER DCA - WAIVED): 7.8 % — ABNORMAL HIGH (ref <7.0)

## 2020-08-16 LAB — CBC WITH DIFFERENTIAL/PLATELET
Basophils Absolute: 0 10*3/uL (ref 0.0–0.2)
Basos: 1 %
EOS (ABSOLUTE): 0.3 10*3/uL (ref 0.0–0.4)
Eos: 5 %
Hematocrit: 54.2 % — ABNORMAL HIGH (ref 37.5–51.0)
Hemoglobin: 17.5 g/dL (ref 13.0–17.7)
Immature Grans (Abs): 0 10*3/uL (ref 0.0–0.1)
Immature Granulocytes: 0 %
Lymphocytes Absolute: 1.5 10*3/uL (ref 0.7–3.1)
Lymphs: 24 %
MCH: 29.5 pg (ref 26.6–33.0)
MCHC: 32.3 g/dL (ref 31.5–35.7)
MCV: 91 fL (ref 79–97)
Monocytes Absolute: 0.7 10*3/uL (ref 0.1–0.9)
Monocytes: 10 %
Neutrophils Absolute: 3.9 10*3/uL (ref 1.4–7.0)
Neutrophils: 60 %
Platelets: 180 10*3/uL (ref 150–450)
RBC: 5.93 x10E6/uL — ABNORMAL HIGH (ref 4.14–5.80)
RDW: 13 % (ref 11.6–15.4)
WBC: 6.4 10*3/uL (ref 3.4–10.8)

## 2020-08-16 LAB — TESTOSTERONE,FREE AND TOTAL
Testosterone, Free: 15.9 pg/mL (ref 6.6–18.1)
Testosterone: 281 ng/dL (ref 264–916)

## 2020-08-16 LAB — CMP14+EGFR
ALT: 21 IU/L (ref 0–44)
AST: 18 IU/L (ref 0–40)
Albumin/Globulin Ratio: 1.8 (ref 1.2–2.2)
Albumin: 4.5 g/dL (ref 3.8–4.8)
Alkaline Phosphatase: 77 IU/L (ref 44–121)
BUN/Creatinine Ratio: 17 (ref 10–24)
BUN: 21 mg/dL (ref 8–27)
Bilirubin Total: 0.8 mg/dL (ref 0.0–1.2)
CO2: 24 mmol/L (ref 20–29)
Calcium: 9.6 mg/dL (ref 8.6–10.2)
Chloride: 96 mmol/L (ref 96–106)
Creatinine, Ser: 1.22 mg/dL (ref 0.76–1.27)
Globulin, Total: 2.5 g/dL (ref 1.5–4.5)
Glucose: 184 mg/dL — ABNORMAL HIGH (ref 65–99)
Potassium: 4.7 mmol/L (ref 3.5–5.2)
Sodium: 137 mmol/L (ref 134–144)
Total Protein: 7 g/dL (ref 6.0–8.5)
eGFR: 67 mL/min/{1.73_m2} (ref >59)

## 2020-08-16 LAB — LIPID PANEL
Chol/HDL Ratio: 2.9 ratio (ref 0.0–5.0)
Cholesterol, Total: 134 mg/dL (ref 100–199)
HDL: 47 mg/dL (ref >39)
LDL Chol Calc (NIH): 58 mg/dL (ref 0–99)
Triglycerides: 176 mg/dL — ABNORMAL HIGH (ref 0–149)
VLDL Cholesterol Cal: 29 mg/dL (ref 5–40)

## 2020-08-21 ENCOUNTER — Ambulatory Visit: Payer: BC Managed Care – PPO | Admitting: Family Medicine

## 2020-09-01 ENCOUNTER — Other Ambulatory Visit: Payer: Self-pay | Admitting: *Deleted

## 2020-09-01 DIAGNOSIS — E1121 Type 2 diabetes mellitus with diabetic nephropathy: Secondary | ICD-10-CM

## 2020-09-01 MED ORDER — TRULICITY 3 MG/0.5ML ~~LOC~~ SOAJ: 3.0000 mg | SUBCUTANEOUS | 3 refills | Status: DC

## 2020-09-03 ENCOUNTER — Encounter: Payer: Self-pay | Admitting: Family Medicine

## 2020-09-03 ENCOUNTER — Other Ambulatory Visit: Payer: Self-pay

## 2020-09-03 ENCOUNTER — Ambulatory Visit: Payer: BC Managed Care – PPO | Admitting: Family Medicine

## 2020-09-03 VITALS — BP 119/68 | HR 78 | Temp 98.2°F | Ht 72.0 in | Wt 280.8 lb

## 2020-09-03 DIAGNOSIS — N1831 Chronic kidney disease, stage 3a: Secondary | ICD-10-CM

## 2020-09-03 DIAGNOSIS — Z794 Long term (current) use of insulin: Secondary | ICD-10-CM

## 2020-09-03 DIAGNOSIS — E1121 Type 2 diabetes mellitus with diabetic nephropathy: Secondary | ICD-10-CM

## 2020-09-03 DIAGNOSIS — I10 Essential (primary) hypertension: Secondary | ICD-10-CM

## 2020-09-03 DIAGNOSIS — E1169 Type 2 diabetes mellitus with other specified complication: Secondary | ICD-10-CM | POA: Diagnosis not present

## 2020-09-03 DIAGNOSIS — E785 Hyperlipidemia, unspecified: Secondary | ICD-10-CM

## 2020-09-03 MED ORDER — TRIAMCINOLONE ACETONIDE 0.1 % EX CREA: 1.0000 "application " | TOPICAL_CREAM | Freq: Three times a day (TID) | CUTANEOUS | 0 refills | Status: AC

## 2020-09-03 NOTE — Addendum Note (Signed)
Addended by: Mechele Claude on: 09/03/2020 02:47 PM   Modules accepted: Orders

## 2020-09-03 NOTE — Progress Notes (Addendum)
Subjective:  Patient ID: Eugene Bautista,  male    DOB: 30-Jul-1955  Age: 65 y.o.    CC: Medical Management of Chronic Issues   HPI [name] presents for  follow-up of hypertension. Patient has no history of headache chest pain or shortness of breath or recent cough. Patient also denies symptoms of TIA such as numbness weakness lateralizing. Patient denies side effects from medication. States taking it regularly.  Patient also  in for follow-up of elevated cholesterol. Doing well without complaints on current medication. Denies side effects  including myalgia and arthralgia and nausea. Also in today for liver function testing. Currently no chest pain, shortness of breath or other cardiovascular related symptoms noted.  Follow-up of diabetes. Patient does check blood sugar at home. Readings run between 180 and 200 Patient denies symptoms such as excessive hunger or urinary frequency, excessive hunger, nausea No significant hypoglycemic spells noted. Medications reviewed. Pt reports taking them regularly. Pt. denies complication/adverse reaction today.    History [place] has a past medical history of Carotid artery occlusion, Diabetes mellitus without complication ([facility]), Hyperlipidemia, Hypertension, Left carotid bruit, and Obesity.   He has a past surgical history that includes Ruptured Aorta Repair (1989); Urinary surgery (1989); and Fracture surgery (Left, 1989).   His family history includes Aneurysm (age of onset: 53) in his sister; Cancer in his brother; Dementia in his mother; Diabetes in his father; Healthy in his daughter; Heart attack (age of onset: 89) in his father; Heart disease in his father; Kidney disease in his father.He reports that he quit smoking about 19 years ago. His smoking use included cigarettes. He has a 43.75 pack-year smoking history. He has never used smokeless tobacco. He reports current alcohol use of about 6.0 standard drinks of alcohol per  week. He reports that he does not use drugs.  Current Outpatient Medications on File Prior to Visit  Medication Sig Dispense Refill   amLODipine (NORVASC) 10 MG tablet Take 1 tablet (10 mg total) by mouth daily. 90 tablet 1   aspirin 81 MG tablet Take 81 mg by mouth daily.     cholecalciferol (VITAMIN D) 1000 UNITS tablet Take 2,000 Units by mouth daily.     dapagliflozin propanediol (FARXIGA) 10 MG TABS tablet TAKE 1 TABLET BY MOUTH  DAILY BEFORE BREAKFAST 90 tablet 1   Dulaglutide (TRULICITY) 3 MG/0.5ML SOPN Inject 3 mg into the skin once a week. 6 mL 3   fish oil-omega-3 fatty acids 1000 MG capsule Take 1 g by mouth 2 (two) times daily.     glipiZIDE (GLUCOTROL) 5 MG tablet Take 1 tablet (5 mg total) by mouth every morning. 90 tablet 1   metFORMIN (GLUCOPHAGE) 1000 MG tablet Take 1 tablet (1,000 mg total) by mouth 2 (two) times daily with a meal. 180 tablet 1   metoprolol succinate (TOPROL-XL) 25 MG 24 hr tablet Take 1 tablet (25 mg total) by mouth daily. 90 tablet 1   rosuvastatin (CRESTOR) 10 MG tablet TAKE 1 TABLET BY MOUTH AT  BEDTIME 90 tablet 0   Semaglutide, 1 MG/DOSE, (OZEMPIC, 1 MG/DOSE,) 4 MG/3ML SOPN INJECT SUBCUTANEOUSLY 1 MG  WEEKLY 9 mL 0   sildenafil (REVATIO) 20 MG tablet Take 2-5 pills at once, orally, with each sexual encounter 50 tablet 5   valsartan-hydrochlorothiazide (DIOVAN-HCT) 320-25 MG tablet Take 1 tablet by mouth daily. 90 tablet 1   No current facility-administered medications on file prior to visit.    ROS Review of Systems  Constitutional:  Negative for fever.  Respiratory:  Negative for shortness of breath.   Cardiovascular:  Negative for chest pain.  Musculoskeletal:  Negative for arthralgias.  Skin:  Negative for rash.   Objective:  BP 119/68   Pulse 78   Temp 98.2 F (36.8 C)   Ht 6' (1.829 m)   Wt 280 lb 12.8 oz (127.4 kg)   SpO2 95%   BMI 38.08 kg/m   BP Readings from Last 3 Encounters:  09/03/20 119/68  05/01/20 132/75  01/29/20  129/63    Wt Readings from Last 3 Encounters:  09/03/20 280 lb 12.8 oz (127.4 kg)  05/01/20 282 lb 2 oz (128 kg)  01/29/20 287 lb 8 oz (130.4 kg)     Physical Exam Constitutional:      General: He is not in acute distress.    Appearance: He is well-developed.  HENT:     Head: Normocephalic and atraumatic.     Right Ear: External ear normal.     Left Ear: External ear normal.     Nose: Nose normal.  Eyes:     Conjunctiva/sclera: Conjunctivae normal.     Pupils: Pupils are equal, round, and reactive to light.  Cardiovascular:     Rate and Rhythm: Normal rate and regular rhythm.     Heart sounds: Normal heart sounds. No murmur heard. Pulmonary:     Effort: Pulmonary effort is normal. No respiratory distress.     Breath sounds: Normal breath sounds. No wheezing or rales.  Abdominal:     Palpations: Abdomen is soft.     Tenderness: There is no abdominal tenderness.  Musculoskeletal:        General: Normal range of motion.     Cervical back: Normal range of motion and neck supple.  Skin:    General: Skin is warm and dry.  Neurological:     Mental Status: He is alert and oriented to person, place, and time.     Deep Tendon Reflexes: Reflexes are normal and symmetric.  Psychiatric:        Behavior: Behavior normal.        Thought Content: Thought content normal.        Judgment: Judgment normal.    Diabetic Foot Exam - Simple   Simple Foot Form Diabetic Foot exam was performed with the following findings: Yes 09/03/2020  2:45 PM  Visual Inspection No deformities, no ulcerations, no other skin breakdown bilaterally: Yes Sensation Testing Intact to touch and monofilament testing bilaterally: Yes Pulse Check Posterior Tibialis and Dorsalis pulse intact bilaterally: Yes Comments       Assessment & Plan:   Daniyal was seen today for medical management of chronic issues.  Diagnoses and all orders for this visit:  Chronic renal impairment, stage 3a  (HCC)  Dyslipidemia associated with type 2 diabetes mellitus (HCC)  Essential hypertension  Type 2 diabetes mellitus with diabetic nephropathy, with long-term current use of insulin ([facility])  Other orders -     triamcinolone cream (KENALOG) 0.1 %; Apply 1 application topically 3 (three) times daily. Avoid face and genitalia  I am having Chrissie Noa C. Dobis start on triamcinolone cream. I am also having him maintain his aspirin, fish oil-omega-3 fatty acids, cholecalciferol, sildenafil, Ozempic (1 MG/DOSE), valsartan-hydrochlorothiazide, metoprolol succinate, metFORMIN, glipiZIDE, dapagliflozin propanediol, amLODipine, rosuvastatin, and Trulicity.  Meds ordered this encounter  Medications   triamcinolone cream (KENALOG) 0.1 %    Sig: Apply 1 application topically 3 (three) times daily. Avoid face and  genitalia    Dispense:  45 g    Refill:  0      Follow-up: Return in about 3 months (around 12/04/2020).  Mechele Claude, M.D.

## 2020-09-14 IMAGING — DX DG KNEE 1-2V*L*
2 series · 2 of 2 positions shown · non-contrast
Comparison: 6360

CLINICAL DATA: Chronic pain and interferes with exercise

EXAM:
LEFT KNEE - 1-2 VIEW

[knee ap]
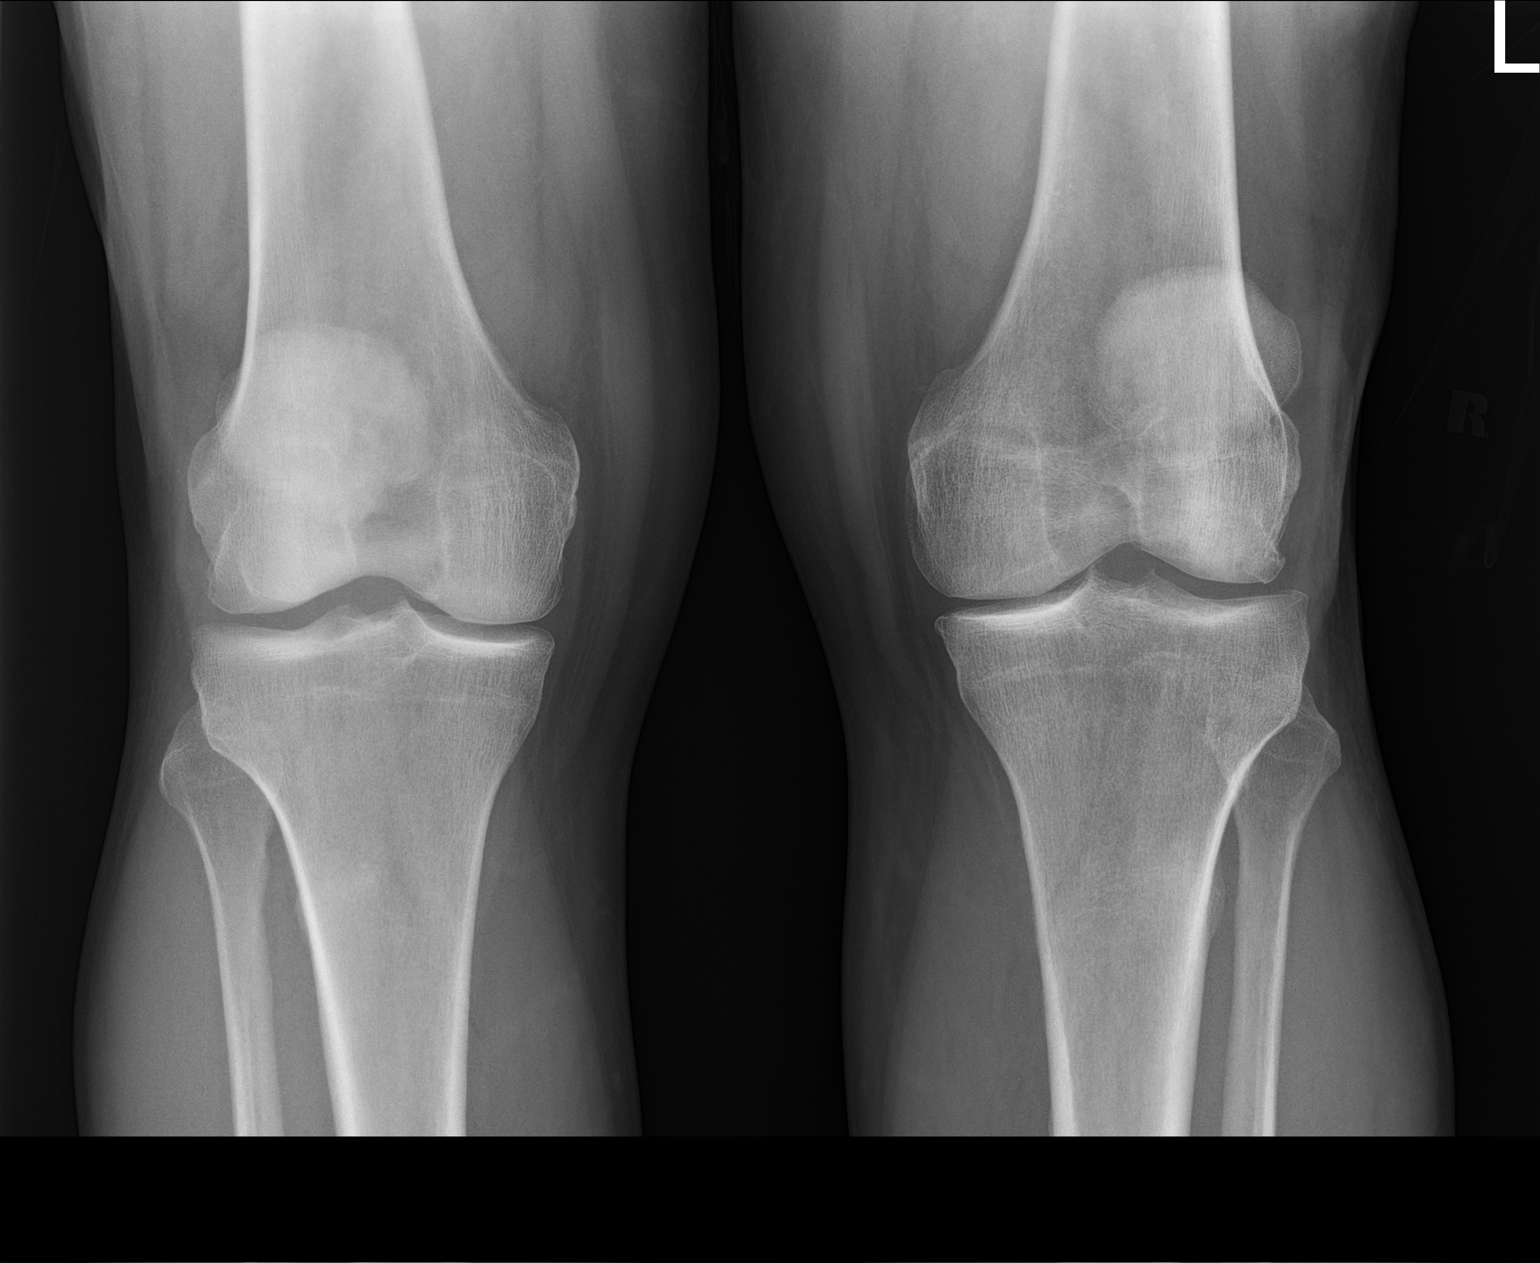

[knee lat]
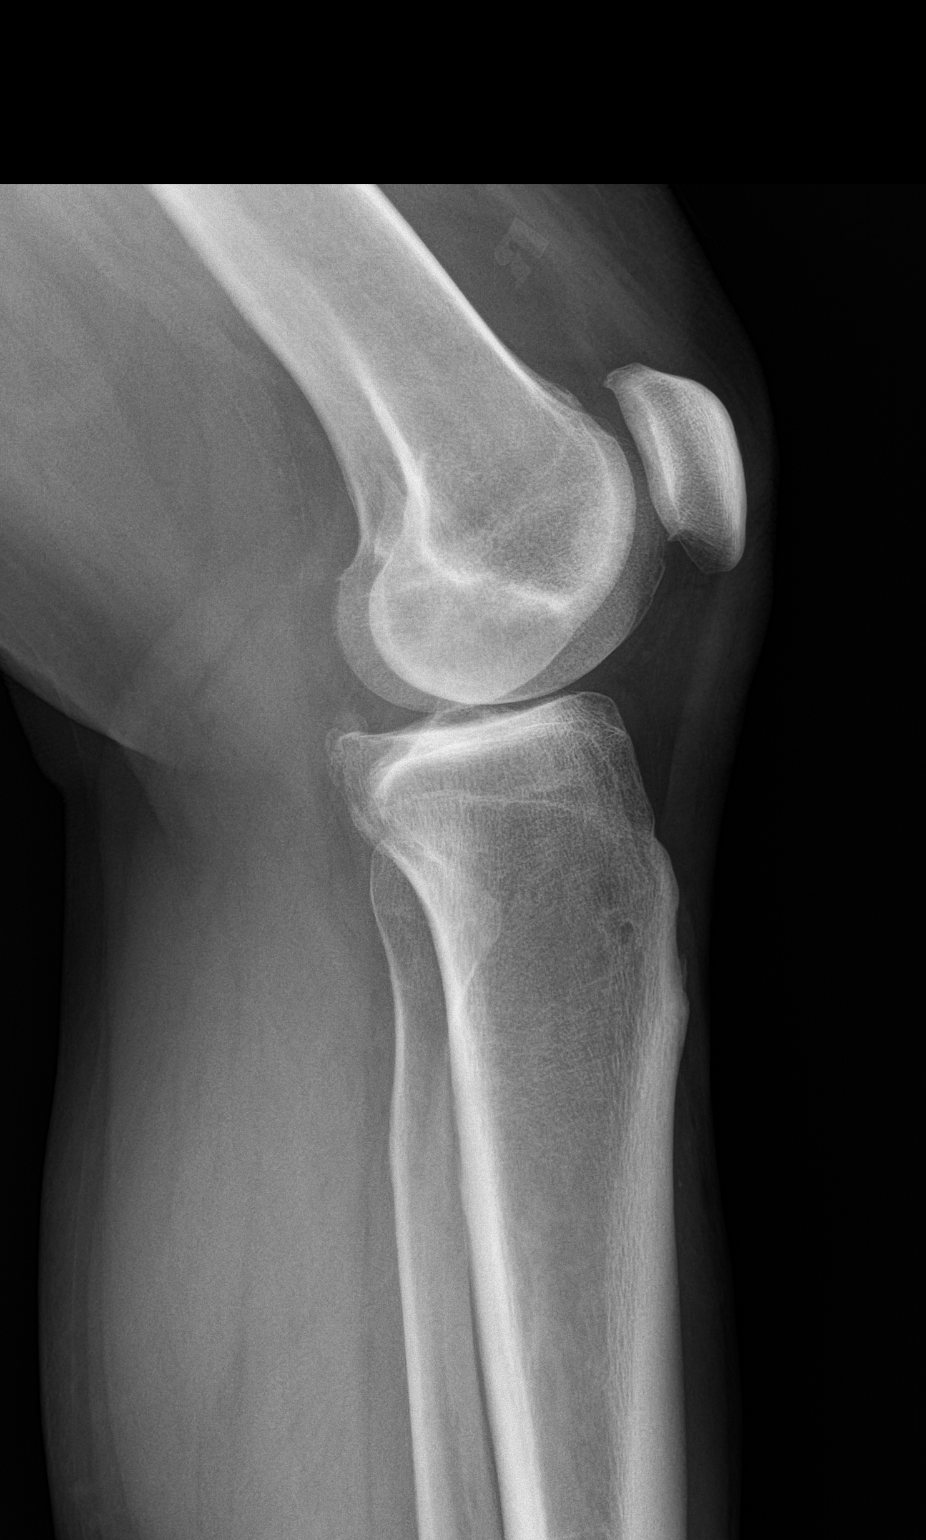

[2 of 2 positions shown; findings below may reference images not displayed]

FINDINGS: Alignment is anatomic. No acute fracture. No joint effusion.
Tricompartmental, but primarily medial compartment osteoarthritis.
Joint space narrowing is similar to the prior study.
IMPRESSION: Similar medial compartment osteoarthritis.

## 2020-09-16 ENCOUNTER — Other Ambulatory Visit: Payer: Self-pay | Admitting: Family Medicine

## 2020-09-16 DIAGNOSIS — I10 Essential (primary) hypertension: Secondary | ICD-10-CM

## 2020-09-16 DIAGNOSIS — E1121 Type 2 diabetes mellitus with diabetic nephropathy: Secondary | ICD-10-CM

## 2020-12-05 ENCOUNTER — Ambulatory Visit: Payer: BC Managed Care – PPO | Admitting: Family Medicine

## 2020-12-05 ENCOUNTER — Other Ambulatory Visit: Payer: Self-pay | Admitting: Family Medicine

## 2020-12-05 ENCOUNTER — Encounter: Payer: Self-pay | Admitting: Family Medicine

## 2020-12-05 ENCOUNTER — Other Ambulatory Visit: Payer: Self-pay

## 2020-12-05 VITALS — BP 115/72 | HR 75 | Temp 98.1°F | Ht 72.0 in | Wt 283.4 lb

## 2020-12-05 DIAGNOSIS — Z794 Long term (current) use of insulin: Secondary | ICD-10-CM

## 2020-12-05 DIAGNOSIS — E785 Hyperlipidemia, unspecified: Secondary | ICD-10-CM | POA: Diagnosis not present

## 2020-12-05 DIAGNOSIS — E1169 Type 2 diabetes mellitus with other specified complication: Secondary | ICD-10-CM

## 2020-12-05 DIAGNOSIS — I10 Essential (primary) hypertension: Secondary | ICD-10-CM

## 2020-12-05 DIAGNOSIS — Z23 Encounter for immunization: Secondary | ICD-10-CM | POA: Diagnosis not present

## 2020-12-05 DIAGNOSIS — E1121 Type 2 diabetes mellitus with diabetic nephropathy: Secondary | ICD-10-CM | POA: Diagnosis not present

## 2020-12-05 LAB — LIPID PANEL
Chol/HDL Ratio: 2.8 ratio (ref 0.0–5.0)
Cholesterol, Total: 113 mg/dL (ref 100–199)
HDL: 41 mg/dL (ref >39)
LDL Chol Calc (NIH): 43 mg/dL (ref 0–99)
Triglycerides: 172 mg/dL — ABNORMAL HIGH (ref 0–149)
VLDL Cholesterol Cal: 29 mg/dL (ref 5–40)

## 2020-12-05 LAB — CMP14+EGFR
ALT: 21 IU/L (ref 0–44)
AST: 18 IU/L (ref 0–40)
Albumin/Globulin Ratio: 1.8 (ref 1.2–2.2)
Albumin: 4.6 g/dL (ref 3.8–4.8)
Alkaline Phosphatase: 73 IU/L (ref 44–121)
BUN/Creatinine Ratio: 24 (ref 10–24)
BUN: 32 mg/dL — ABNORMAL HIGH (ref 8–27)
Bilirubin Total: 0.8 mg/dL (ref 0.0–1.2)
CO2: 24 mmol/L (ref 20–29)
Calcium: 9.5 mg/dL (ref 8.6–10.2)
Chloride: 98 mmol/L (ref 96–106)
Creatinine, Ser: 1.35 mg/dL — ABNORMAL HIGH (ref 0.76–1.27)
Globulin, Total: 2.6 g/dL (ref 1.5–4.5)
Glucose: 222 mg/dL — ABNORMAL HIGH (ref 70–99)
Potassium: 5.4 mmol/L — ABNORMAL HIGH (ref 3.5–5.2)
Sodium: 137 mmol/L (ref 134–144)
Total Protein: 7.2 g/dL (ref 6.0–8.5)
eGFR: 59 mL/min/{1.73_m2} — ABNORMAL LOW (ref >59)

## 2020-12-05 LAB — CBC WITH DIFFERENTIAL/PLATELET
Basophils Absolute: 0.1 10*3/uL (ref 0.0–0.2)
Basos: 1 %
EOS (ABSOLUTE): 0.3 10*3/uL (ref 0.0–0.4)
Eos: 5 %
Hematocrit: 50 % (ref 37.5–51.0)
Hemoglobin: 17.2 g/dL (ref 13.0–17.7)
Immature Grans (Abs): 0 10*3/uL (ref 0.0–0.1)
Immature Granulocytes: 0 %
Lymphocytes Absolute: 1.4 10*3/uL (ref 0.7–3.1)
Lymphs: 20 %
MCH: 30.8 pg (ref 26.6–33.0)
MCHC: 34.4 g/dL (ref 31.5–35.7)
MCV: 89 fL (ref 79–97)
Monocytes Absolute: 0.7 10*3/uL (ref 0.1–0.9)
Monocytes: 10 %
Neutrophils Absolute: 4.6 10*3/uL (ref 1.4–7.0)
Neutrophils: 64 %
Platelets: 197 10*3/uL (ref 150–450)
RBC: 5.59 x10E6/uL (ref 4.14–5.80)
RDW: 12.9 % (ref 11.6–15.4)
WBC: 7.2 10*3/uL (ref 3.4–10.8)

## 2020-12-05 LAB — BAYER DCA HB A1C WAIVED: HB A1C (BAYER DCA - WAIVED): 7.7 % — ABNORMAL HIGH (ref 4.8–5.6)

## 2020-12-05 MED ORDER — METOPROLOL SUCCINATE ER 25 MG PO TB24: 25.0000 mg | ORAL_TABLET | Freq: Every day | ORAL | 3 refills | Status: DC

## 2020-12-05 MED ORDER — GLIPIZIDE 5 MG PO TABS
5.0000 mg | ORAL_TABLET | Freq: Every morning | ORAL | 3 refills | Status: DC
Start: 2020-12-05 — End: 2021-03-19

## 2020-12-05 MED ORDER — ROSUVASTATIN CALCIUM 10 MG PO TABS: 10.0000 mg | ORAL_TABLET | Freq: Every day | ORAL | 3 refills | Status: DC

## 2020-12-05 MED ORDER — AMLODIPINE BESYLATE 10 MG PO TABS: 10.0000 mg | ORAL_TABLET | Freq: Every day | ORAL | 3 refills | Status: DC

## 2020-12-05 MED ORDER — METFORMIN HCL 1000 MG PO TABS: 1000.0000 mg | ORAL_TABLET | Freq: Two times a day (BID) | ORAL | 3 refills | Status: DC

## 2020-12-05 MED ORDER — DAPAGLIFLOZIN PROPANEDIOL 10 MG PO TABS: 10.0000 mg | ORAL_TABLET | Freq: Every day | ORAL | 3 refills | Status: DC

## 2020-12-05 MED ORDER — VALSARTAN-HYDROCHLOROTHIAZIDE 320-25 MG PO TABS: 1.0000 | ORAL_TABLET | Freq: Every day | ORAL | 3 refills | Status: DC

## 2020-12-05 NOTE — Progress Notes (Signed)
Subjective:  Patient ID: Eugene Bautista,  male    DOB: [birth year]  Age: 65 y.o.    CC: Medical Management of Chronic Issues   HPI Eugene Bautista presents for  follow-up of hypertension. Patient has no history of headache chest pain or shortness of breath or recent cough. Patient also denies symptoms of TIA such as numbness weakness lateralizing. Patient denies side effects from medication. States taking it regularly.  Patient also  in for follow-up of elevated cholesterol. Doing well without complaints on current medication. Denies side effects  including myalgia and arthralgia and nausea. Also in today for liver function testing. Currently no chest pain, shortness of breath or other cardiovascular related symptoms noted.  Follow-up of diabetes. Patient does check blood sugar at home. Readings run between 180 and 220. Realiizes he is not exercsing or following a good diet. Patient denies symptoms such as excessive hunger or urinary frequency, excessive hunger, nausea No significant hypoglycemic spells noted. Medications reviewed. Pt reports taking them regularly. Pt. denies complication/adverse reaction today.    History Eugene Bautista has a past medical history of Carotid artery occlusion, Diabetes mellitus without complication ([place]), Hyperlipidemia, Hypertension, Left carotid bruit, and Obesity.   He has a past surgical history that includes Ruptured Aorta Repair (1989); Urinary surgery (1989); and Fracture surgery (Left, 1989).   His family history includes Aneurysm (age of onset: 81) in his sister; Cancer in his brother; Dementia in his mother; Diabetes in his father; Healthy in his daughter; Heart attack (age of onset: 55) in his father; Heart disease in his father; Kidney disease in his father.He reports that he quit smoking about 19 years ago. His smoking use included cigarettes. He has a 43.75 pack-year smoking history. He has never used smokeless tobacco. He reports current  alcohol use of about 6.0 standard drinks per week. He reports that he does not use drugs.  Current Outpatient Medications on File Prior to Visit  Medication Sig Dispense Refill   aspirin 81 MG tablet Take 81 mg by mouth daily.     cholecalciferol (VITAMIN D) 1000 UNITS tablet Take 2,000 Units by mouth daily.     Dulaglutide (TRULICITY) 3 QI/6.9GE SOPN Inject 3 mg into the skin once a week. 6 mL 3   fish oil-omega-3 fatty acids 1000 MG capsule Take 1 g by mouth 2 (two) times daily.     sildenafil (REVATIO) 20 MG tablet Take 2-5 pills at once, orally, with each sexual encounter 50 tablet 5   triamcinolone cream (KENALOG) 0.1 % Apply 1 application topically 3 (three) times daily. Avoid face and genitalia 45 g 0   No current facility-administered medications on file prior to visit.    ROS Review of Systems  Constitutional:  Negative for fever.  Respiratory:  Negative for shortness of breath.   Cardiovascular:  Negative for chest pain.  Musculoskeletal:  Negative for arthralgias.  Skin:  Negative for rash.  Neurological:  Positive for numbness (occasional in feet at the end of the day, with tingling).   Objective:  BP 115/72   Pulse 75   Temp 98.1 F (36.7 C)   Ht 6' (1.829 m)   Wt 283 lb 6.4 oz (128.5 kg)   SpO2 94%   BMI 38.44 kg/m   BP Readings from Last 3 Encounters:  12/05/20 115/72  09/03/20 119/68  05/01/20 132/75    Wt Readings from Last 3 Encounters:  12/05/20 283 lb 6.4 oz (128.5 kg)  09/03/20 280 lb 12.8 oz (  127.4 kg)  05/01/20 282 lb 2 oz (128 kg)     Physical Exam Constitutional:      General: He is not in acute distress.    Appearance: He is well-developed.  HENT:     Head: Normocephalic and atraumatic.     Right Ear: External ear normal.     Left Ear: External ear normal.     Nose: Nose normal.  Eyes:     Conjunctiva/sclera: Conjunctivae normal.     Pupils: Pupils are equal, round, and reactive to light.  Cardiovascular:     Rate and Rhythm:  Normal rate and regular rhythm.     Heart sounds: Normal heart sounds. No murmur heard. Pulmonary:     Effort: Pulmonary effort is normal. No respiratory distress.     Breath sounds: Normal breath sounds. No wheezing or rales.  Abdominal:     Palpations: Abdomen is soft.     Tenderness: There is no abdominal tenderness.  Musculoskeletal:        General: Normal range of motion.     Cervical back: Normal range of motion and neck supple.  Skin:    General: Skin is warm and dry.  Neurological:     Mental Status: He is alert and oriented to person, place, and time.     Deep Tendon Reflexes: Reflexes are normal and symmetric.  Psychiatric:        Behavior: Behavior normal.        Thought Content: Thought content normal.        Judgment: Judgment normal.    Diabetic Foot Exam - Simple   Simple Foot Form Diabetic Foot exam was performed with the following findings: Yes 12/05/2020  8:22 AM  Visual Inspection No deformities, no ulcerations, no other skin breakdown bilaterally: Yes Sensation Testing Intact to touch and monofilament testing bilaterally: Yes Pulse Check Posterior Tibialis and Dorsalis pulse intact bilaterally: Yes Comments       Assessment & Plan:   Eugene Bautista was seen today for medical management of chronic issues.  Diagnoses and all orders for this visit:  Type 2 diabetes mellitus with diabetic nephropathy, with long-term current use of insulin ([place]) -     Bayer DCA Hb A1c Waived -     CBC with Differential/Platelet -     dapagliflozin propanediol (FARXIGA) 10 MG TABS tablet; Take 1 tablet (10 mg total) by mouth daily before breakfast. -     glipiZIDE (GLUCOTROL) 5 MG tablet; Take 1 tablet (5 mg total) by mouth every morning. -     metFORMIN (GLUCOPHAGE) 1000 MG tablet; Take 1 tablet (1,000 mg total) by mouth 2 (two) times daily with a meal.  Essential hypertension -     CMP14+EGFR -     amLODipine (NORVASC) 10 MG tablet; Take 1 tablet (10 mg total) by mouth  daily. -     metoprolol succinate (TOPROL-XL) 25 MG 24 hr tablet; Take 1 tablet (25 mg total) by mouth daily. -     valsartan-hydrochlorothiazide (DIOVAN-HCT) 320-25 MG tablet; Take 1 tablet by mouth daily.  Dyslipidemia associated with type 2 diabetes mellitus ([place]) -     Lipid panel  Other orders -     rosuvastatin (CRESTOR) 10 MG tablet; Take 1 tablet (10 mg total) by mouth at bedtime.  I have discontinued Luanna Cole. Bartnick's Ozempic (1 MG/DOSE). I have changed his Wilder Glade to dapagliflozin propanediol. I have also changed his metFORMIN, rosuvastatin, and valsartan-hydrochlorothiazide. Additionally, I am having him maintain his aspirin,  fish oil-omega-3 fatty acids, cholecalciferol, sildenafil, Trulicity, triamcinolone cream, amLODipine, glipiZIDE, and metoprolol succinate.  Meds ordered this encounter  Medications   amLODipine (NORVASC) 10 MG tablet    Sig: Take 1 tablet (10 mg total) by mouth daily.    Dispense:  90 tablet    Refill:  3    Requesting 1 year supply   dapagliflozin propanediol (FARXIGA) 10 MG TABS tablet    Sig: Take 1 tablet (10 mg total) by mouth daily before breakfast.    Dispense:  90 tablet    Refill:  3   glipiZIDE (GLUCOTROL) 5 MG tablet    Sig: Take 1 tablet (5 mg total) by mouth every morning.    Dispense:  90 tablet    Refill:  3    Requesting 1 year supply   metFORMIN (GLUCOPHAGE) 1000 MG tablet    Sig: Take 1 tablet (1,000 mg total) by mouth 2 (two) times daily with a meal.    Dispense:  180 tablet    Refill:  3   metoprolol succinate (TOPROL-XL) 25 MG 24 hr tablet    Sig: Take 1 tablet (25 mg total) by mouth daily.    Dispense:  90 tablet    Refill:  3    Requesting 1 year supply   rosuvastatin (CRESTOR) 10 MG tablet    Sig: Take 1 tablet (10 mg total) by mouth at bedtime.    Dispense:  90 tablet    Refill:  3   valsartan-hydrochlorothiazide (DIOVAN-HCT) 320-25 MG tablet    Sig: Take 1 tablet by mouth daily.    Dispense:  90 tablet     Refill:  3   Encouraged diet and exercise to control glucose.   Follow-up: Return in about 3 months (around 03/06/2021).  Claretta Fraise, M.D.

## 2021-03-04 ENCOUNTER — Telehealth: Payer: Self-pay | Admitting: Family Medicine

## 2021-03-04 ENCOUNTER — Other Ambulatory Visit: Payer: Self-pay | Admitting: Family Medicine

## 2021-03-04 MED ORDER — SITAGLIPTIN PHOSPHATE 100 MG PO TABS: 100.0000 mg | ORAL_TABLET | Freq: Every day | ORAL | 0 refills | Status: DC

## 2021-03-04 NOTE — Telephone Encounter (Signed)
I sent in Januvia pills as a temporary substitute. When your last shot wears off, take it daily until the trulicity is available.

## 2021-03-04 NOTE — Telephone Encounter (Signed)
PATIENT AWARE

## 2021-03-19 ENCOUNTER — Ambulatory Visit: Payer: BC Managed Care – PPO | Admitting: Family Medicine

## 2021-03-19 ENCOUNTER — Encounter: Payer: Self-pay | Admitting: Family Medicine

## 2021-03-19 VITALS — BP 114/56 | HR 72 | Temp 97.6°F | Ht 72.0 in | Wt 285.0 lb

## 2021-03-19 DIAGNOSIS — Z125 Encounter for screening for malignant neoplasm of prostate: Secondary | ICD-10-CM | POA: Diagnosis not present

## 2021-03-19 DIAGNOSIS — E1121 Type 2 diabetes mellitus with diabetic nephropathy: Secondary | ICD-10-CM

## 2021-03-19 DIAGNOSIS — E291 Testicular hypofunction: Secondary | ICD-10-CM | POA: Diagnosis not present

## 2021-03-19 DIAGNOSIS — Z23 Encounter for immunization: Secondary | ICD-10-CM

## 2021-03-19 DIAGNOSIS — Z794 Long term (current) use of insulin: Secondary | ICD-10-CM

## 2021-03-19 DIAGNOSIS — I10 Essential (primary) hypertension: Secondary | ICD-10-CM

## 2021-03-19 DIAGNOSIS — E785 Hyperlipidemia, unspecified: Secondary | ICD-10-CM

## 2021-03-19 DIAGNOSIS — E1169 Type 2 diabetes mellitus with other specified complication: Secondary | ICD-10-CM | POA: Diagnosis not present

## 2021-03-19 LAB — BAYER DCA HB A1C WAIVED: HB A1C (BAYER DCA - WAIVED): 8.1 % — ABNORMAL HIGH (ref 4.8–5.6)

## 2021-03-19 MED ORDER — GLIPIZIDE 10 MG PO TABS: 10.0000 mg | ORAL_TABLET | Freq: Every morning | ORAL | 3 refills | Status: DC

## 2021-03-19 NOTE — Addendum Note (Signed)
Addended by: Baldomero Lamy B on: 03/19/2021 11:21 AM   Modules accepted: Orders

## 2021-03-19 NOTE — Progress Notes (Signed)
Subjective:  Patient ID: Eugene Bautista,  male    DOB: 02-Aug-1955  Age: 66 y.o.    CC: Medical Management of Chronic Issues   HPI Eugene Bautista presents for  follow-up of hypertension. Patient has no history of headache chest pain or shortness of breath or recent cough. Patient also denies symptoms of TIA such as numbness weakness lateralizing. Patient denies side effects from medication. States taking it regularly.  Patient also  in for follow-up of elevated cholesterol. Doing well without complaints on current medication. Denies side effects  including myalgia and arthralgia and nausea. Also in today for liver function testing. Currently no chest pain, shortness of breath or other cardiovascular related symptoms noted.  Follow-up of diabetes. Patient does check blood sugar at home. Readings run between 180 fasting  and 130-160 prandial.  Patient denies symptoms such as excessive hunger or urinary frequency, excessive hunger, nausea No significant hypoglycemic spells noted. Medications reviewed. Pt reports taking them regularly. Pt. denies complication/adverse reaction today. Pharmacy is out of Trulicity. Using Januvia as a temporary substitute.    History Eugene Bautista has a past medical history of Carotid artery occlusion, Diabetes mellitus without complication ([place]), Hyperlipidemia, Hypertension, Left carotid bruit, and Obesity.   He has a past surgical history that includes Ruptured Aorta Repair (1989); Urinary surgery (1989); and Fracture surgery (Left, 1989).   His family history includes Aneurysm (age of onset: 32) in his sister; Cancer in his brother; Dementia in his mother; Diabetes in his father; Healthy in his daughter; Heart attack (age of onset: 74) in his father; Heart disease in his father; Kidney disease in his father.He reports that he quit smoking about 20 years ago. His smoking use included cigarettes. He has a 43.75 pack-year smoking history. He has never used  smokeless tobacco. He reports current alcohol use of about 6.0 standard drinks per week. He reports that he does not use drugs.  Current Outpatient Medications on File Prior to Visit  Medication Sig Dispense Refill   amLODipine (NORVASC) 10 MG tablet Take 1 tablet (10 mg total) by mouth daily. 90 tablet 3   aspirin 81 MG tablet Take 81 mg by mouth daily.     cholecalciferol (VITAMIN D) 1000 UNITS tablet Take 2,000 Units by mouth daily.     dapagliflozin propanediol (FARXIGA) 10 MG TABS tablet Take 1 tablet (10 mg total) by mouth daily before breakfast. 90 tablet 3   Dulaglutide (TRULICITY) 3 LT/5.3UY SOPN Inject 3 mg into the skin once a week. 6 mL 3   fish oil-omega-3 fatty acids 1000 MG capsule Take 1 g by mouth 2 (two) times daily.     metFORMIN (GLUCOPHAGE) 1000 MG tablet Take 1 tablet (1,000 mg total) by mouth 2 (two) times daily with a meal. 180 tablet 3   metoprolol succinate (TOPROL-XL) 25 MG 24 hr tablet Take 1 tablet (25 mg total) by mouth daily. 90 tablet 3   rosuvastatin (CRESTOR) 10 MG tablet Take 1 tablet (10 mg total) by mouth at bedtime. 90 tablet 3   sildenafil (REVATIO) 20 MG tablet Take 2-5 pills at once, orally, with each sexual encounter 50 tablet 5   sitaGLIPtin (JANUVIA) 100 MG tablet Take 1 tablet (100 mg total) by mouth daily. 30 tablet 0   triamcinolone cream (KENALOG) 0.1 % Apply 1 application topically 3 (three) times daily. Avoid face and genitalia 45 g 0   valsartan-hydrochlorothiazide (DIOVAN-HCT) 320-25 MG tablet Take 1 tablet by mouth daily. 90 tablet 3  No current facility-administered medications on file prior to visit.    ROS Review of Systems  Constitutional:  Negative for fever.  Respiratory:  Negative for shortness of breath.   Cardiovascular:  Negative for chest pain.  Musculoskeletal:  Negative for arthralgias.  Skin:  Negative for rash.   Objective:  BP (!) 114/56    Pulse 72    Temp 97.6 F (36.4 C)    Ht 6' (1.829 m)    Wt 285 lb (129.3 kg)     SpO2 96%    BMI 38.65 kg/m   BP Readings from Last 3 Encounters:  03/19/21 (!) 114/56  12/05/20 115/72  09/03/20 119/68    Wt Readings from Last 3 Encounters:  03/19/21 285 lb (129.3 kg)  12/05/20 283 lb 6.4 oz (128.5 kg)  09/03/20 280 lb 12.8 oz (127.4 kg)     Physical Exam Constitutional:      General: He is not in acute distress.    Appearance: He is well-developed.  HENT:     Head: Normocephalic and atraumatic.     Right Ear: External ear normal.     Left Ear: External ear normal.     Nose: Nose normal.  Eyes:     Conjunctiva/sclera: Conjunctivae normal.     Pupils: Pupils are equal, round, and reactive to light.  Cardiovascular:     Rate and Rhythm: Normal rate and regular rhythm.     Heart sounds: Normal heart sounds. No murmur heard. Pulmonary:     Effort: Pulmonary effort is normal. No respiratory distress.     Breath sounds: Normal breath sounds. No wheezing or rales.  Abdominal:     Palpations: Abdomen is soft.     Tenderness: There is no abdominal tenderness.  Musculoskeletal:        General: Normal range of motion.     Cervical back: Normal range of motion and neck supple.  Skin:    General: Skin is warm and dry.  Neurological:     Mental Status: He is alert and oriented to person, place, and time.     Deep Tendon Reflexes: Reflexes are normal and symmetric.  Psychiatric:        Behavior: Behavior normal.        Thought Content: Thought content normal.        Judgment: Judgment normal.    Diabetic Foot Exam - Simple   No data filed       Assessment & Plan:   Eugene Bautista was seen today for medical management of chronic issues.  Diagnoses and all orders for this visit:  Type 2 diabetes mellitus with diabetic nephropathy, with long-term current use of insulin ([facility]) -     Bayer DCA Hb A1c Waived -     glipiZIDE (GLUCOTROL) 10 MG tablet; Take 1 tablet (10 mg total) by mouth every morning.  Essential hypertension -     CBC with  Differential/Platelet -     CMP14+EGFR  Dyslipidemia associated with type 2 diabetes mellitus ([place]) -     Lipid panel  Screening for prostate cancer -     PSA, total and free  Hypogonadism, testicular -     Testosterone,Free and Total   I have changed Eugene Bautista's glipiZIDE. I am also having him maintain his aspirin, fish oil-omega-3 fatty acids, cholecalciferol, sildenafil, Trulicity, triamcinolone cream, amLODipine, dapagliflozin propanediol, metFORMIN, metoprolol succinate, rosuvastatin, valsartan-hydrochlorothiazide, and sitaGLIPtin.  Meds ordered this encounter  Medications   glipiZIDE (GLUCOTROL) 10 MG tablet  Sig: Take 1 tablet (10 mg total) by mouth every morning.    Dispense:  90 tablet    Refill:  3    Requesting 1 year supply     Follow-up: Return in about 3 months (around 06/17/2021).  Claretta Fraise, M.D.

## 2021-03-24 LAB — CMP14+EGFR
ALT: 20 IU/L (ref 0–44)
AST: 18 IU/L (ref 0–40)
Albumin/Globulin Ratio: 1.3 (ref 1.2–2.2)
Albumin: 4.2 g/dL (ref 3.8–4.8)
Alkaline Phosphatase: 78 IU/L (ref 44–121)
BUN/Creatinine Ratio: 17 (ref 10–24)
BUN: 23 mg/dL (ref 8–27)
Bilirubin Total: 0.7 mg/dL (ref 0.0–1.2)
CO2: 20 mmol/L (ref 20–29)
Calcium: 9.7 mg/dL (ref 8.6–10.2)
Chloride: 94 mmol/L — ABNORMAL LOW (ref 96–106)
Creatinine, Ser: 1.38 mg/dL — ABNORMAL HIGH (ref 0.76–1.27)
Globulin, Total: 3.2 g/dL (ref 1.5–4.5)
Glucose: 181 mg/dL — ABNORMAL HIGH (ref 70–99)
Potassium: 5 mmol/L (ref 3.5–5.2)
Sodium: 139 mmol/L (ref 134–144)
Total Protein: 7.4 g/dL (ref 6.0–8.5)
eGFR: 57 mL/min/{1.73_m2} — ABNORMAL LOW (ref >59)

## 2021-03-24 LAB — CBC WITH DIFFERENTIAL/PLATELET
Basophils Absolute: 0.1 10*3/uL (ref 0.0–0.2)
Basos: 1 %
EOS (ABSOLUTE): 0.3 10*3/uL (ref 0.0–0.4)
Eos: 4 %
Hematocrit: 51.9 % — ABNORMAL HIGH (ref 37.5–51.0)
Hemoglobin: 17.5 g/dL (ref 13.0–17.7)
Immature Grans (Abs): 0 10*3/uL (ref 0.0–0.1)
Immature Granulocytes: 1 %
Lymphocytes Absolute: 1.4 10*3/uL (ref 0.7–3.1)
Lymphs: 17 %
MCH: 30.1 pg (ref 26.6–33.0)
MCHC: 33.7 g/dL (ref 31.5–35.7)
MCV: 89 fL (ref 79–97)
Monocytes Absolute: 0.9 10*3/uL (ref 0.1–0.9)
Monocytes: 11 %
Neutrophils Absolute: 5.2 10*3/uL (ref 1.4–7.0)
Neutrophils: 66 %
Platelets: 202 10*3/uL (ref 150–450)
RBC: 5.81 x10E6/uL — ABNORMAL HIGH (ref 4.14–5.80)
RDW: 12.6 % (ref 11.6–15.4)
WBC: 7.8 10*3/uL (ref 3.4–10.8)

## 2021-03-24 LAB — LIPID PANEL
Chol/HDL Ratio: 2.8 ratio (ref 0.0–5.0)
Cholesterol, Total: 112 mg/dL (ref 100–199)
HDL: 40 mg/dL (ref >39)
LDL Chol Calc (NIH): 46 mg/dL (ref 0–99)
Triglycerides: 152 mg/dL — ABNORMAL HIGH (ref 0–149)
VLDL Cholesterol Cal: 26 mg/dL (ref 5–40)

## 2021-03-24 LAB — TESTOSTERONE,FREE AND TOTAL
Testosterone, Free: 9.7 pg/mL (ref 6.6–18.1)
Testosterone: 164 ng/dL — ABNORMAL LOW (ref 264–916)

## 2021-03-24 LAB — PSA, TOTAL AND FREE
PSA, Free Pct: 30 %
PSA, Free: 0.18 ng/mL
Prostate Specific Ag, Serum: 0.6 ng/mL (ref 0.0–4.0)

## 2021-03-25 ENCOUNTER — Telehealth: Payer: Self-pay | Admitting: Family Medicine

## 2021-03-25 DIAGNOSIS — Z794 Long term (current) use of insulin: Secondary | ICD-10-CM

## 2021-03-25 MED ORDER — TRULICITY 3 MG/0.5ML ~~LOC~~ SOAJ: 3.0000 mg | SUBCUTANEOUS | 0 refills | Status: DC

## 2021-03-25 NOTE — Telephone Encounter (Signed)
Aware Rx has been sent into [facility]

## 2021-03-27 ENCOUNTER — Telehealth: Payer: Self-pay | Admitting: *Deleted

## 2021-03-27 NOTE — Telephone Encounter (Signed)
Approvedtoday Request Reference Number: [id number]. TRULICITY INJ 0000000 is approved through 03/27/2022. Your patient may now fill this prescription and it will be covered.  Pharm aware

## 2021-03-27 NOTE — Telephone Encounter (Signed)
Key: [id number] - PA Case ID: [id number] - Rx #: [id number] help? Call us at [phone] Status Sent to Plan today  Trulicity 3MG /0.5ML -injectors

## 2021-04-01 ENCOUNTER — Other Ambulatory Visit: Payer: Self-pay | Admitting: Family Medicine

## 2021-04-09 ENCOUNTER — Telehealth: Payer: Self-pay | Admitting: Family Medicine

## 2021-04-09 NOTE — Telephone Encounter (Signed)
PATIENT AWARE

## 2021-04-09 NOTE — Telephone Encounter (Signed)
Patient would like to speak to a nurse about a medication. Please call back.

## 2021-04-09 NOTE — Telephone Encounter (Signed)
TAke the trulicity one day after the last Januvia

## 2021-04-09 NOTE — Telephone Encounter (Signed)
Patient wants to transition back on the trulicity after Januvia how should he do it ?

## 2021-06-22 ENCOUNTER — Ambulatory Visit: Payer: BC Managed Care – PPO | Admitting: Family Medicine

## 2021-06-22 ENCOUNTER — Encounter: Payer: Self-pay | Admitting: Family Medicine

## 2021-06-22 VITALS — BP 121/69 | HR 70 | Temp 97.6°F | Ht 72.0 in | Wt 279.4 lb

## 2021-06-22 DIAGNOSIS — E1121 Type 2 diabetes mellitus with diabetic nephropathy: Secondary | ICD-10-CM

## 2021-06-22 DIAGNOSIS — I10 Essential (primary) hypertension: Secondary | ICD-10-CM | POA: Diagnosis not present

## 2021-06-22 DIAGNOSIS — E1169 Type 2 diabetes mellitus with other specified complication: Secondary | ICD-10-CM | POA: Diagnosis not present

## 2021-06-22 DIAGNOSIS — E291 Testicular hypofunction: Secondary | ICD-10-CM

## 2021-06-22 DIAGNOSIS — E785 Hyperlipidemia, unspecified: Secondary | ICD-10-CM

## 2021-06-22 DIAGNOSIS — Z23 Encounter for immunization: Secondary | ICD-10-CM

## 2021-06-22 DIAGNOSIS — Z1211 Encounter for screening for malignant neoplasm of colon: Secondary | ICD-10-CM

## 2021-06-22 DIAGNOSIS — Z794 Long term (current) use of insulin: Secondary | ICD-10-CM

## 2021-06-22 LAB — BAYER DCA HB A1C WAIVED: HB A1C (BAYER DCA - WAIVED): 7.8 % — ABNORMAL HIGH (ref 4.8–5.6)

## 2021-06-22 MED ORDER — TRULICITY 3 MG/0.5ML ~~LOC~~ SOAJ: 3.0000 mg | SUBCUTANEOUS | 3 refills | Status: DC

## 2021-06-22 NOTE — Addendum Note (Signed)
Addended by: Baldomero Lamy B on: 06/22/2021 04:19 PM ? ? Modules accepted: Orders ? ?

## 2021-06-22 NOTE — Progress Notes (Signed)
? ?Subjective:  ?Patient ID: Eugene Bautista, male    DOB: [birth year]  Age: 66 y.o. MRN: [id number] ? ?CC: Medical Management of Chronic Issues ? ? ?HPI ?Eugene Bautista presents forFollow-up of diabetes. I need to get a better hold on my sugar. Patient checks blood sugar at home.  ? 180-200 fasting and 150-160 postprandial ?Patient denies symptoms such as polyuria, polydipsia, excessive hunger, nausea ?No significant hypoglycemic spells noted. ?Medications reviewed. Pt reports taking them regularly without complication/adverse reaction being reported today.  ? ?History ?Eugene Bautista has a past medical history of Carotid artery occlusion, Diabetes mellitus without complication ([place]), Hyperlipidemia, Hypertension, Left carotid bruit, and Obesity.  ? ?He has a past surgical history that includes Ruptured Aorta Repair (1989); Urinary surgery (1989); and Fracture surgery (Left, 1989).  ? ?His family history includes Aneurysm (age of onset: 68) in his sister; Cancer in his brother; Dementia in his mother; Diabetes in his father; Healthy in his daughter; Heart attack (age of onset: 59) in his father; Heart disease in his father; Kidney disease in his father.He reports that he quit smoking about 20 years ago. His smoking use included cigarettes. He has a 43.75 pack-year smoking history. He has never used smokeless tobacco. He reports current alcohol use of about 6.0 standard drinks per week. He reports that he does not use drugs. ? ?Current Outpatient Medications on File Prior to Visit  ?Medication Sig Dispense Refill  ? amLODipine (NORVASC) 10 MG tablet Take 1 tablet (10 mg total) by mouth daily. 90 tablet 3  ? aspirin 81 MG tablet Take 81 mg by mouth daily.    ? cholecalciferol (VITAMIN D) 1000 UNITS tablet Take 2,000 Units by mouth daily.    ? dapagliflozin propanediol (FARXIGA) 10 MG TABS tablet Take 1 tablet (10 mg total) by mouth daily before breakfast. 90 tablet 3  ? fish oil-omega-3 fatty acids 1000 MG capsule  Take 1 g by mouth 2 (two) times daily.    ? glipiZIDE (GLUCOTROL) 10 MG tablet Take 1 tablet (10 mg total) by mouth every morning. 90 tablet 3  ? metFORMIN (GLUCOPHAGE) 1000 MG tablet Take 1 tablet (1,000 mg total) by mouth 2 (two) times daily with a meal. 180 tablet 3  ? metoprolol succinate (TOPROL-XL) 25 MG 24 hr tablet Take 1 tablet (25 mg total) by mouth daily. 90 tablet 3  ? rosuvastatin (CRESTOR) 10 MG tablet Take 1 tablet (10 mg total) by mouth at bedtime. 90 tablet 3  ? sildenafil (REVATIO) 20 MG tablet Take 2-5 pills at once, orally, with each sexual encounter 50 tablet 5  ? triamcinolone cream (KENALOG) 0.1 % Apply 1 application topically 3 (three) times daily. Avoid face and genitalia 45 g 0  ? valsartan-hydrochlorothiazide (DIOVAN-HCT) 320-25 MG tablet Take 1 tablet by mouth daily. 90 tablet 3  ? ?No current facility-administered medications on file prior to visit.  ? ? ?ROS ?Review of Systems  ?Constitutional:  Negative for fever.  ?Respiratory:  Negative for shortness of breath.   ?Cardiovascular:  Negative for chest pain.  ?Musculoskeletal:  Negative for arthralgias.  ?Skin:  Negative for rash.  ? ?Objective:  ?BP 121/69   Pulse 70   Temp 97.6 ?F (36.4 ?C)   Ht 6' (1.829 m)   Wt 279 lb 6.4 oz (126.7 kg)   SpO2 94%   BMI 37.89 kg/m?  ? ?BP Readings from Last 3 Encounters:  ?06/22/21 121/69  ?03/19/21 (!) 114/56  ?12/05/20 115/72  ? ? ?Wt Readings from  Last 3 Encounters:  ?06/22/21 279 lb 6.4 oz (126.7 kg)  ?03/19/21 285 lb (129.3 kg)  ?12/05/20 283 lb 6.4 oz (128.5 kg)  ? ? ? ?Physical Exam ?Constitutional:   ?   General: He is not in acute distress. ?   Appearance: He is well-developed.  ?HENT:  ?   Head: Normocephalic and atraumatic.  ?   Right Ear: External ear normal.  ?   Left Ear: External ear normal.  ?   Nose: Nose normal.  ?Eyes:  ?   Conjunctiva/sclera: Conjunctivae normal.  ?   Pupils: Pupils are equal, round, and reactive to light.  ?Cardiovascular:  ?   Rate and Rhythm: Normal rate  and regular rhythm.  ?   Heart sounds: Normal heart sounds. No murmur heard. ?Pulmonary:  ?   Effort: Pulmonary effort is normal. No respiratory distress.  ?   Breath sounds: Normal breath sounds. No wheezing or rales.  ?Abdominal:  ?   Palpations: Abdomen is soft.  ?   Tenderness: There is no abdominal tenderness.  ?Musculoskeletal:     ?   General: Normal range of motion.  ?   Cervical back: Normal range of motion and neck supple.  ?Skin: ?   General: Skin is warm and dry.  ?Neurological:  ?   Mental Status: He is alert and oriented to person, place, and time.  ?   Deep Tendon Reflexes: Reflexes are normal and symmetric.  ?Psychiatric:     ?   Behavior: Behavior normal.     ?   Thought Content: Thought content normal.     ?   Judgment: Judgment normal.  ? ? ? ? ?Assessment & Plan:  ? ?Kyshawn was seen today for medical management of chronic issues. ? ?Diagnoses and all orders for this visit: ? ?Type 2 diabetes mellitus with diabetic nephropathy, with long-term current use of insulin ([place]) ?-     Bayer DCA Hb A1c Waived ?-     Dulaglutide (TRULICITY) 3 ZL/9.3TT SOPN; Inject 3 mg into the skin once a week. ? ?Essential hypertension ?-     CBC with Differential/Platelet ?-     CMP14+EGFR ? ?Dyslipidemia associated with type 2 diabetes mellitus ([place]) ?-     Lipid panel ? ?Hypogonadism, testicular ?-     Testosterone,Free and Total ? ?Screen for colon cancer ?-     Cologuard ? ? ? ?Pt prefers not to treat testosterone at this time. Declines colonoscopy, but willing to do cologuard. ? ?I have discontinued Luanna Cole. Marini's Januvia. I am also having him maintain his aspirin, fish oil-omega-3 fatty acids, cholecalciferol, sildenafil, triamcinolone cream, amLODipine, dapagliflozin propanediol, metFORMIN, metoprolol succinate, rosuvastatin, valsartan-hydrochlorothiazide, glipiZIDE, and Trulicity. ? ?Meds ordered this encounter  ?Medications  ? Dulaglutide (TRULICITY) 3 SV/7.7LT SOPN  ?  Sig: Inject 3 mg into the skin once  a week.  ?  Dispense:  6 mL  ?  Refill:  3  ? ? ? ?Follow-up: Return in about 3 months (around 09/21/2021). ? ?Claretta Fraise, M.D. ?

## 2021-06-23 LAB — CMP14+EGFR
ALT: 19 IU/L (ref 0–44)
AST: 13 IU/L (ref 0–40)
Albumin/Globulin Ratio: 1.9 (ref 1.2–2.2)
Albumin: 4.4 g/dL (ref 3.8–4.8)
Alkaline Phosphatase: 78 IU/L (ref 44–121)
BUN/Creatinine Ratio: 20 (ref 10–24)
BUN: 26 mg/dL (ref 8–27)
Bilirubin Total: 0.8 mg/dL (ref 0.0–1.2)
CO2: 24 mmol/L (ref 20–29)
Calcium: 9.7 mg/dL (ref 8.6–10.2)
Chloride: 97 mmol/L (ref 96–106)
Creatinine, Ser: 1.27 mg/dL (ref 0.76–1.27)
Globulin, Total: 2.3 g/dL (ref 1.5–4.5)
Glucose: 190 mg/dL — ABNORMAL HIGH (ref 70–99)
Potassium: 4.9 mmol/L (ref 3.5–5.2)
Sodium: 137 mmol/L (ref 134–144)
Total Protein: 6.7 g/dL (ref 6.0–8.5)
eGFR: 63 mL/min/{1.73_m2} (ref >59)

## 2021-06-23 LAB — CBC WITH DIFFERENTIAL/PLATELET
Basophils Absolute: 0 10*3/uL (ref 0.0–0.2)
Basos: 0 %
EOS (ABSOLUTE): 0.3 10*3/uL (ref 0.0–0.4)
Eos: 4 %
Hematocrit: 49.5 % (ref 37.5–51.0)
Hemoglobin: 16.9 g/dL (ref 13.0–17.7)
Immature Grans (Abs): 0 10*3/uL (ref 0.0–0.1)
Immature Granulocytes: 0 %
Lymphocytes Absolute: 1.4 10*3/uL (ref 0.7–3.1)
Lymphs: 20 %
MCH: 30.2 pg (ref 26.6–33.0)
MCHC: 34.1 g/dL (ref 31.5–35.7)
MCV: 88 fL (ref 79–97)
Monocytes Absolute: 0.7 10*3/uL (ref 0.1–0.9)
Monocytes: 10 %
Neutrophils Absolute: 4.8 10*3/uL (ref 1.4–7.0)
Neutrophils: 66 %
Platelets: 204 10*3/uL (ref 150–450)
RBC: 5.6 x10E6/uL (ref 4.14–5.80)
RDW: 13.1 % (ref 11.6–15.4)
WBC: 7.3 10*3/uL (ref 3.4–10.8)

## 2021-06-23 LAB — LIPID PANEL
Chol/HDL Ratio: 2.7 ratio (ref 0.0–5.0)
Cholesterol, Total: 118 mg/dL (ref 100–199)
HDL: 43 mg/dL (ref >39)
LDL Chol Calc (NIH): 48 mg/dL (ref 0–99)
Triglycerides: 161 mg/dL — ABNORMAL HIGH (ref 0–149)
VLDL Cholesterol Cal: 27 mg/dL (ref 5–40)

## 2021-06-23 LAB — TESTOSTERONE,FREE AND TOTAL
Testosterone, Free: 18.2 pg/mL — ABNORMAL HIGH (ref 6.6–18.1)
Testosterone: 202 ng/dL — ABNORMAL LOW (ref 264–916)

## 2021-08-01 DIAGNOSIS — H40033 Anatomical narrow angle, bilateral: Secondary | ICD-10-CM | POA: Diagnosis not present

## 2021-08-01 DIAGNOSIS — E119 Type 2 diabetes mellitus without complications: Secondary | ICD-10-CM | POA: Diagnosis not present

## 2021-12-18 ENCOUNTER — Telehealth: Payer: Self-pay | Admitting: Family Medicine

## 2021-12-18 NOTE — Telephone Encounter (Signed)
yes

## 2021-12-18 NOTE — Telephone Encounter (Signed)
Pt was on Dulaglutide (TRULICITY) 3 YT/1.1NB SOPN but [facility] nurse changed pt to [place] 12.5 but pharmacy is out and so is the [facility].  Spouse wants to know if pt can take some of the Dulaglutide (TRULICITY) 3 VA/7.0LI SOPN that he has on hand until he can talk to [facility] on Monday. Spouse is aware that [facility] is off today. Please call back

## 2021-12-18 NOTE — Telephone Encounter (Signed)
PT AWARE TO GO AHEAD AND GO BACK ON TRULITCITY

## 2022-01-25 ENCOUNTER — Other Ambulatory Visit (HOSPITAL_BASED_OUTPATIENT_CLINIC_OR_DEPARTMENT_OTHER): Payer: Self-pay

## 2022-01-25 MED ORDER — MOUNJARO 15 MG/0.5ML ~~LOC~~ SOAJ
15.0000 mg | SUBCUTANEOUS | 1 refills | Status: DC
  Filled 2022-01-25: qty 2, 28d supply, fill #0
  Filled 2022-02-17: qty 2, 28d supply, fill #1

## 2022-01-30 ENCOUNTER — Other Ambulatory Visit: Payer: Self-pay | Admitting: Family Medicine

## 2022-01-30 DIAGNOSIS — I10 Essential (primary) hypertension: Secondary | ICD-10-CM

## 2022-01-30 DIAGNOSIS — Z794 Long term (current) use of insulin: Secondary | ICD-10-CM

## 2022-02-17 ENCOUNTER — Other Ambulatory Visit (HOSPITAL_BASED_OUTPATIENT_CLINIC_OR_DEPARTMENT_OTHER): Payer: Self-pay

## 2022-02-18 ENCOUNTER — Other Ambulatory Visit (HOSPITAL_BASED_OUTPATIENT_CLINIC_OR_DEPARTMENT_OTHER): Payer: Self-pay

## 2022-03-11 ENCOUNTER — Encounter: Payer: Self-pay | Admitting: Family Medicine

## 2022-03-11 ENCOUNTER — Ambulatory Visit: Payer: BC Managed Care – PPO | Admitting: Family Medicine

## 2022-03-11 VITALS — BP 110/56 | HR 78 | Temp 98.2°F | Ht 72.0 in | Wt 282.0 lb

## 2022-03-11 DIAGNOSIS — I10 Essential (primary) hypertension: Secondary | ICD-10-CM | POA: Diagnosis not present

## 2022-03-11 DIAGNOSIS — Z23 Encounter for immunization: Secondary | ICD-10-CM | POA: Diagnosis not present

## 2022-03-11 DIAGNOSIS — E1169 Type 2 diabetes mellitus with other specified complication: Secondary | ICD-10-CM

## 2022-03-11 DIAGNOSIS — N481 Balanitis: Secondary | ICD-10-CM | POA: Diagnosis not present

## 2022-03-11 DIAGNOSIS — Z794 Long term (current) use of insulin: Secondary | ICD-10-CM | POA: Diagnosis not present

## 2022-03-11 DIAGNOSIS — E1121 Type 2 diabetes mellitus with diabetic nephropathy: Secondary | ICD-10-CM | POA: Diagnosis not present

## 2022-03-11 DIAGNOSIS — E785 Hyperlipidemia, unspecified: Secondary | ICD-10-CM

## 2022-03-11 LAB — URINALYSIS
Bilirubin, UA: NEGATIVE
Leukocytes,UA: NEGATIVE
Nitrite, UA: NEGATIVE
RBC, UA: NEGATIVE
Specific Gravity, UA: >1.03 — ABNORMAL HIGH (ref 1.005–1.030)
Urobilinogen, Ur: 1 mg/dL (ref 0.2–1.0)
pH, UA: 5.5 (ref 5.0–7.5)

## 2022-03-11 LAB — BAYER DCA HB A1C WAIVED: HB A1C (BAYER DCA - WAIVED): 8 % — ABNORMAL HIGH (ref 4.8–5.6)

## 2022-03-11 MED ORDER — GLIPIZIDE 10 MG PO TABS: 10.0000 mg | ORAL_TABLET | Freq: Every morning | ORAL | 3 refills | Status: DC

## 2022-03-11 MED ORDER — AMLODIPINE BESYLATE 10 MG PO TABS: 10.0000 mg | ORAL_TABLET | Freq: Every day | ORAL | 3 refills | Status: DC

## 2022-03-11 MED ORDER — METFORMIN HCL 1000 MG PO TABS: 1000.0000 mg | ORAL_TABLET | Freq: Two times a day (BID) | ORAL | 3 refills | Status: DC

## 2022-03-11 MED ORDER — METOPROLOL SUCCINATE ER 25 MG PO TB24: 25.0000 mg | ORAL_TABLET | Freq: Every day | ORAL | 3 refills | Status: DC

## 2022-03-11 MED ORDER — ROSUVASTATIN CALCIUM 10 MG PO TABS: 10.0000 mg | ORAL_TABLET | Freq: Every day | ORAL | 3 refills | Status: DC

## 2022-03-11 MED ORDER — MOUNJARO 15 MG/0.5ML ~~LOC~~ SOAJ: 15.0000 mg | SUBCUTANEOUS | 3 refills | Status: DC

## 2022-03-11 MED ORDER — TESTOSTERONE 30 MG BU MISC: 30.0000 mg | Freq: Every day | BUCCAL | 1 refills | Status: DC

## 2022-03-11 MED ORDER — DAPAGLIFLOZIN PROPANEDIOL 10 MG PO TABS: 10.0000 mg | ORAL_TABLET | Freq: Every day | ORAL | 3 refills | Status: DC

## 2022-03-11 MED ORDER — VALSARTAN-HYDROCHLOROTHIAZIDE 320-25 MG PO TABS: 1.0000 | ORAL_TABLET | Freq: Every day | ORAL | 3 refills | Status: DC

## 2022-03-11 NOTE — Progress Notes (Signed)
Subjective:  Patient ID: Eugene Bautista,  male    DOB: 1955/10/23  Age: 67 y.o.    CC: Medical Management of Chronic Issues   HPI Layden Caterino presents for  follow-up of hypertension. Patient has no history of headache chest pain or shortness of breath or recent cough. Patient also denies symptoms of TIA such as numbness weakness lateralizing. Patient denies side effects from medication. States taking it regularly.  Patient also  in for follow-up of elevated cholesterol. Doing well without complaints on current medication. Denies side effects  including myalgia and arthralgia and nausea. Also in today for liver function testing. Currently no chest pain, shortness of breath or other cardiovascular related symptoms noted.  Follow-up of diabetes. Patient does check blood sugar at home. Aic at work 8.6 a couple months ago.  No significant hypoglycemic spells noted. Medications reviewed. Pt reports taking them regularly. Pt. denies complication/adverse reaction today.   Getting redness, inflammation of penis. Not circumcised. Not currently present.  History Kamarie has a past medical history of Carotid artery occlusion, Diabetes mellitus without complication ([place]), Hyperlipidemia, Hypertension, Left carotid bruit, and Obesity.   He has a past surgical history that includes Ruptured Aorta Repair (1989); Urinary surgery (1989); and Fracture surgery (Left, 1989).   His family history includes Aneurysm (age of onset: 60) in his sister; Cancer in his brother; Dementia in his mother; Diabetes in his father; Healthy in his daughter; Heart attack (age of onset: 20) in his father; Heart disease in his father; Kidney disease in his father.He reports that he quit smoking about 21 years ago. His smoking use included cigarettes. He has a 43.75 pack-year smoking history. He has never used smokeless tobacco. He reports current alcohol use of about 6.0 standard drinks of alcohol per week. He  reports that he does not use drugs.  Current Outpatient Medications on File Prior to Visit  Medication Sig Dispense Refill   aspirin 81 MG tablet Take 81 mg by mouth daily.     cholecalciferol (VITAMIN D) 1000 UNITS tablet Take 2,000 Units by mouth daily.     fish oil-omega-3 fatty acids 1000 MG capsule Take 1 g by mouth 2 (two) times daily.     sildenafil (REVATIO) 20 MG tablet Take 2-5 pills at once, orally, with each sexual encounter 50 tablet 5   triamcinolone cream (KENALOG) 0.1 % Apply 1 application topically 3 (three) times daily. Avoid face and genitalia 45 g 0   No current facility-administered medications on file prior to visit.    ROS Review of Systems  Constitutional:  Negative for fever.  Respiratory:  Negative for shortness of breath.   Cardiovascular:  Negative for chest pain.  Genitourinary:  Positive for penile pain (intermittent redness of the glans. Not related to urination. Last about 3 days. Onset 6 weeks ago. No previous occurence. Not circumcised.). Negative for penile swelling.  Musculoskeletal:  Negative for arthralgias.  Skin:  Negative for rash.    Objective:  BP (!) 110/56   Pulse 78   Temp 98.2 F (36.8 C)   Ht 6' (1.829 m)   Wt 282 lb (127.9 kg)   SpO2 96%   BMI 38.25 kg/m   BP Readings from Last 3 Encounters:  03/11/22 (!) 110/56  06/22/21 121/69  03/19/21 (!) 114/56    Wt Readings from Last 3 Encounters:  03/11/22 282 lb (127.9 kg)  06/22/21 279 lb 6.4 oz (126.7 kg)  03/19/21 285 lb (129.3 kg)  Physical Exam Vitals reviewed.  Constitutional:      Appearance: He is well-developed.  HENT:     Head: Normocephalic and atraumatic.     Right Ear: External ear normal.     Left Ear: External ear normal.     Mouth/Throat:     Pharynx: No oropharyngeal exudate or posterior oropharyngeal erythema.  Eyes:     Pupils: Pupils are equal, round, and reactive to light.  Cardiovascular:     Rate and Rhythm: Normal rate and regular rhythm.      Heart sounds: No murmur heard. Pulmonary:     Effort: No respiratory distress.     Breath sounds: Normal breath sounds.  Musculoskeletal:     Cervical back: Normal range of motion and neck supple.  Neurological:     Mental Status: He is alert and oriented to person, place, and time.        Lab Results  Component Value Date   HGBA1C 8.0 (H) 03/11/2022   HGBA1C 7.8 (H) 06/22/2021   HGBA1C 8.1 (H) 03/19/2021    Assessment & Plan:   Devarius was seen today for medical management of chronic issues.  Diagnoses and all orders for this visit:  Type 2 diabetes mellitus with diabetic nephropathy, with long-term current use of insulin (HCC) -     Bayer DCA Hb A1c Waived -     dapagliflozin propanediol (FARXIGA) 10 MG TABS tablet; Take 1 tablet (10 mg total) by mouth daily before breakfast. -     glipiZIDE (GLUCOTROL) 10 MG tablet; Take 1 tablet (10 mg total) by mouth every morning. -     metFORMIN (GLUCOPHAGE) 1000 MG tablet; Take 1 tablet (1,000 mg total) by mouth 2 (two) times daily with a meal.  Essential hypertension -     Cancel: CBC with Differential/Platelet -     Cancel: CMP14+EGFR -     amLODipine (NORVASC) 10 MG tablet; Take 1 tablet (10 mg total) by mouth daily. -     metoprolol succinate (TOPROL-XL) 25 MG 24 hr tablet; Take 1 tablet (25 mg total) by mouth daily. -     valsartan-hydrochlorothiazide (DIOVAN-HCT) 320-25 MG tablet; Take 1 tablet by mouth daily.  Dyslipidemia associated with type 2 diabetes mellitus ([place]) -     Cancel: Lipid panel  Need for influenza vaccination -     Flu Vaccine QUAD High Dose(Fluad)  Balanitis -     Urinalysis -     Urine Culture  Other orders -     rosuvastatin (CRESTOR) 10 MG tablet; Take 1 tablet (10 mg total) by mouth at bedtime. -     tirzepatide (MOUNJARO) 15 MG/0.5ML Pen; Inject 15 mg into the skin once a week. -     Testosterone 30 MG MISC; Place 30 mg inside cheek daily.   I have discontinued Luanna Cole. Braaksma's  Trulicity. I have changed his Wilder Glade to dapagliflozin propanediol. I have also changed his amLODipine, glipiZIDE, metFORMIN, metoprolol succinate, rosuvastatin, and valsartan-hydrochlorothiazide. Additionally, I am having him start on Testosterone. Lastly, I am having him maintain his aspirin, fish oil-omega-3 fatty acids, cholecalciferol, sildenafil, triamcinolone cream, and Mounjaro.  Meds ordered this encounter  Medications   amLODipine (NORVASC) 10 MG tablet    Sig: Take 1 tablet (10 mg total) by mouth daily.    Dispense:  90 tablet    Refill:  3    Please send a replace/new response with 90-Day Supply if appropriate to maximize member benefit.   dapagliflozin propanediol (FARXIGA) 10  MG TABS tablet    Sig: Take 1 tablet (10 mg total) by mouth daily before breakfast.    Dispense:  90 tablet    Refill:  3    Please send a replace/new response with 90-Day Supply if appropriate to maximize member benefit.   glipiZIDE (GLUCOTROL) 10 MG tablet    Sig: Take 1 tablet (10 mg total) by mouth every morning.    Dispense:  90 tablet    Refill:  3    Please send a replace/new response with 90-Day Supply if appropriate to maximize member benefit.   metFORMIN (GLUCOPHAGE) 1000 MG tablet    Sig: Take 1 tablet (1,000 mg total) by mouth 2 (two) times daily with a meal.    Dispense:  180 tablet    Refill:  3    Please send a replace/new response with 90-Day Supply if appropriate to maximize member benefit.   metoprolol succinate (TOPROL-XL) 25 MG 24 hr tablet    Sig: Take 1 tablet (25 mg total) by mouth daily.    Dispense:  90 tablet    Refill:  3    Please send a replace/new response with 90-Day Supply if appropriate to maximize member benefit.   rosuvastatin (CRESTOR) 10 MG tablet    Sig: Take 1 tablet (10 mg total) by mouth at bedtime.    Dispense:  90 tablet    Refill:  3    Please send a replace/new response with 90-Day Supply if appropriate to maximize member benefit.   tirzepatide  (MOUNJARO) 15 MG/0.5ML Pen    Sig: Inject 15 mg into the skin once a week.    Dispense:  2 mL    Refill:  3   valsartan-hydrochlorothiazide (DIOVAN-HCT) 320-25 MG tablet    Sig: Take 1 tablet by mouth daily.    Dispense:  90 tablet    Refill:  3    Please send a replace/new response with 90-Day Supply if appropriate to maximize member benefit. .   Testosterone 30 MG MISC    Sig: Place 30 mg inside cheek daily.    Dispense:  90 each    Refill:  1    Discussed adding pioglitazone. He will consider if A1c doesn't respond to adding testosterone replacement plus renewed efforts at diet and exercise.  Follow-up: Return in about 3 months (around 06/10/2022).  Claretta Fraise, M.D.

## 2022-03-12 LAB — URINE CULTURE: Organism ID, Bacteria: NO GROWTH

## 2022-03-15 ENCOUNTER — Other Ambulatory Visit: Payer: Self-pay | Admitting: Family Medicine

## 2022-03-15 ENCOUNTER — Telehealth: Payer: Self-pay | Admitting: *Deleted

## 2022-03-15 MED ORDER — ANDROGEL 20.25 MG/1.25GM (1.62%) TD GEL: TRANSDERMAL | 5 refills | Status: DC

## 2022-03-15 NOTE — Telephone Encounter (Signed)
Fax from [facility] Medication clarification RE: Striant 30 mg, has been discontinued by manufacturer Pt has been informed this is no longer available Please provider a new prescription as appropriate

## 2022-03-15 NOTE — Telephone Encounter (Signed)
Gel or shot?

## 2022-03-15 NOTE — Telephone Encounter (Signed)
Please let the patient know that I sent their prescription to their pharmacy. Thanks, WS 

## 2022-03-15 NOTE — Telephone Encounter (Signed)
Patient prefers gel

## 2022-04-09 ENCOUNTER — Encounter: Payer: Self-pay | Admitting: Nurse Practitioner

## 2022-04-09 ENCOUNTER — Ambulatory Visit (HOSPITAL_COMMUNITY)
Admission: RE | Admit: 2022-04-09 | Discharge: 2022-04-09 | Disposition: A | Discharge status: Home / Self Care | Payer: BC Managed Care – PPO | Source: Ambulatory Visit | Attending: Nurse Practitioner | Admitting: Nurse Practitioner

## 2022-04-09 ENCOUNTER — Ambulatory Visit: Payer: BC Managed Care – PPO | Admitting: Nurse Practitioner

## 2022-04-09 VITALS — BP 112/64 | HR 74 | Temp 98.0°F | Resp 20 | Ht 72.0 in | Wt 275.0 lb

## 2022-04-09 DIAGNOSIS — R600 Localized edema: Secondary | ICD-10-CM

## 2022-04-09 DIAGNOSIS — L03116 Cellulitis of left lower limb: Secondary | ICD-10-CM

## 2022-04-09 DIAGNOSIS — Z0389 Encounter for observation for other suspected diseases and conditions ruled out: Secondary | ICD-10-CM | POA: Diagnosis not present

## 2022-04-09 MED ORDER — SULFAMETHOXAZOLE-TRIMETHOPRIM 800-160 MG PO TABS: 1.0000 | ORAL_TABLET | Freq: Two times a day (BID) | ORAL | 0 refills | Status: DC

## 2022-04-09 NOTE — Progress Notes (Signed)
   Subjective:    Patient ID: Eugene Bautista, male    DOB: February 23, 1956, 67 y.o.   MRN: [id number]   Chief Complaint: Left leg red and swollen   HPI Left lower leg swelling that started yesterday. Red and hot to the touch. Worse in calf ara. Rates pain 4/10 currently. Pain increases when walking. He is a Engineer, mining.    Review of Systems  Constitutional:  Negative for diaphoresis.  Eyes:  Negative for pain.  Respiratory:  Negative for shortness of breath.   Cardiovascular:  Negative for chest pain, palpitations and leg swelling.  Gastrointestinal:  Negative for abdominal pain.  Endocrine: Negative for polydipsia.  Skin:  Negative for rash.  Neurological:  Negative for dizziness, weakness and headaches.  Hematological:  Does not bruise/bleed easily.  All other systems reviewed and are negative.      Objective:   Physical Exam Vitals reviewed.  Constitutional:      Appearance: Normal appearance.  Cardiovascular:     Rate and Rhythm: Normal rate and regular rhythm.     Heart sounds: Normal heart sounds.     Comments: Left calf area edematous and red. Warm to touch.  Negative homan sign Pulmonary:     Effort: Pulmonary effort is normal.     Breath sounds: Normal breath sounds.  Skin:    General: Skin is warm.  Neurological:     General: No focal deficit present.     Mental Status: He is alert and oriented to person, place, and time.  Psychiatric:        Mood and Affect: Mood normal.        Behavior: Behavior normal.     BP 112/64   Pulse 74   Temp 98 F (36.7 C) (Temporal)   Resp 20   Ht 6' (1.829 m)   Wt 275 lb (124.7 kg)   SpO2 96%   BMI 37.30 kg/m        Assessment & Plan:   Eugene Bautista in today with chief complaint of Left leg red and swollen   1. Edema of left lower leg Will talk once test have resulted - US Venous Img Lower Unilateral Left; Future  2. Cellulitis of left lower extremity Ice elevate -  sulfamethoxazole-trimethoprim (BACTRIM DS) 800-160 MG tablet; Take 1 tablet by mouth 2 (two) times daily.  Dispense: 20 tablet; Refill: 0    The above assessment and management plan was discussed with the patient. The patient verbalized understanding of and has agreed to the management plan. Patient is aware to call the clinic if symptoms persist or worsen. Patient is aware when to return to the clinic for a follow-up visit. Patient educated on when it is appropriate to go to the emergency department.   Mary-Margaret Hassell Done, FNP

## 2022-04-21 ENCOUNTER — Telehealth: Payer: Self-pay | Admitting: Family Medicine

## 2022-04-21 NOTE — Telephone Encounter (Signed)
Malachy Mood (NP at [facility]) called requesting to speak with MMM regarding pts visit with her and what was discussed. Explained that MMM is off today. She then requested to speak with nurse or whoever is covering for MMM to discuss visit and concerns.  Please call Malachy Mood at [phone]  She said she will be there until 1:30.

## 2022-04-21 NOTE — Telephone Encounter (Signed)
Please call cheryl

## 2022-04-22 ENCOUNTER — Ambulatory Visit: Payer: BC Managed Care – PPO | Admitting: Family

## 2022-04-22 NOTE — Telephone Encounter (Signed)
Lmtcb.

## 2022-04-23 ENCOUNTER — Encounter: Payer: Self-pay | Admitting: Family Medicine

## 2022-04-23 ENCOUNTER — Ambulatory Visit: Payer: BC Managed Care – PPO | Admitting: Family Medicine

## 2022-04-23 VITALS — BP 136/73 | HR 72 | Temp 96.9°F | Ht 73.0 in | Wt 274.8 lb

## 2022-04-23 DIAGNOSIS — E875 Hyperkalemia: Secondary | ICD-10-CM

## 2022-04-23 DIAGNOSIS — L03116 Cellulitis of left lower limb: Secondary | ICD-10-CM | POA: Diagnosis not present

## 2022-04-23 DIAGNOSIS — I872 Venous insufficiency (chronic) (peripheral): Secondary | ICD-10-CM | POA: Diagnosis not present

## 2022-04-23 LAB — BMP8+EGFR
BUN/Creatinine Ratio: 20 (ref 10–24)
BUN: 27 mg/dL (ref 8–27)
CO2: 21 mmol/L (ref 20–29)
Calcium: 9.8 mg/dL (ref 8.6–10.2)
Chloride: 103 mmol/L (ref 96–106)
Creatinine, Ser: 1.32 mg/dL — ABNORMAL HIGH (ref 0.76–1.27)
Glucose: 137 mg/dL — ABNORMAL HIGH (ref 70–99)
Potassium: 5.6 mmol/L — ABNORMAL HIGH (ref 3.5–5.2)
Sodium: 140 mmol/L (ref 134–144)
eGFR: 60 mL/min/{1.73_m2} (ref >59)

## 2022-04-23 MED ORDER — DOXYCYCLINE HYCLATE 100 MG PO TABS: 100.0000 mg | ORAL_TABLET | Freq: Two times a day (BID) | ORAL | 0 refills | Status: AC
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
## 2022-04-24 NOTE — Addendum Note (Signed)
Addended by: Baruch Gouty on: 04/24/2022 01:34 PM   Modules accepted: Orders

## 2022-04-26 ENCOUNTER — Other Ambulatory Visit: Payer: Self-pay | Admitting: *Deleted

## 2022-04-26 NOTE — Telephone Encounter (Signed)
PATIENT SEEN IN OFFICE 2/16

## 2022-04-27 ENCOUNTER — Other Ambulatory Visit: Payer: BC Managed Care – PPO

## 2022-04-27 DIAGNOSIS — E875 Hyperkalemia: Secondary | ICD-10-CM

## 2022-04-28 LAB — BMP8+EGFR
BUN/Creatinine Ratio: 24 (ref 10–24)
BUN: 35 mg/dL — ABNORMAL HIGH (ref 8–27)
CO2: 22 mmol/L (ref 20–29)
Calcium: 9.2 mg/dL (ref 8.6–10.2)
Chloride: 96 mmol/L (ref 96–106)
Creatinine, Ser: 1.46 mg/dL — ABNORMAL HIGH (ref 0.76–1.27)
Glucose: 95 mg/dL (ref 70–99)
Potassium: 4.2 mmol/L (ref 3.5–5.2)
Sodium: 135 mmol/L (ref 134–144)
eGFR: 53 mL/min/{1.73_m2} — ABNORMAL LOW (ref >59)

## 2022-05-12 ENCOUNTER — Other Ambulatory Visit: Payer: Self-pay | Admitting: Family Medicine

## 2022-06-10 ENCOUNTER — Ambulatory Visit (INDEPENDENT_AMBULATORY_CARE_PROVIDER_SITE_OTHER): Payer: BC Managed Care – PPO | Admitting: Family Medicine

## 2022-06-10 ENCOUNTER — Encounter: Payer: Self-pay | Admitting: Family Medicine

## 2022-06-10 VITALS — BP 98/48 | HR 69 | Temp 97.4°F | Ht 73.0 in | Wt 273.0 lb

## 2022-06-10 DIAGNOSIS — I10 Essential (primary) hypertension: Secondary | ICD-10-CM | POA: Diagnosis not present

## 2022-06-10 DIAGNOSIS — E1169 Type 2 diabetes mellitus with other specified complication: Secondary | ICD-10-CM | POA: Diagnosis not present

## 2022-06-10 DIAGNOSIS — Z794 Long term (current) use of insulin: Secondary | ICD-10-CM | POA: Diagnosis not present

## 2022-06-10 DIAGNOSIS — E785 Hyperlipidemia, unspecified: Secondary | ICD-10-CM

## 2022-06-10 DIAGNOSIS — E291 Testicular hypofunction: Secondary | ICD-10-CM | POA: Diagnosis not present

## 2022-06-10 DIAGNOSIS — E1121 Type 2 diabetes mellitus with diabetic nephropathy: Secondary | ICD-10-CM | POA: Diagnosis not present

## 2022-06-10 LAB — BAYER DCA HB A1C WAIVED: HB A1C (BAYER DCA - WAIVED): 6.5 % — ABNORMAL HIGH (ref 4.8–5.6)

## 2022-06-10 LAB — LIPID PANEL

## 2022-06-10 MED ORDER — MOUNJARO 15 MG/0.5ML ~~LOC~~ SOAJ: SUBCUTANEOUS | 3 refills | Status: DC

## 2022-06-10 NOTE — Progress Notes (Signed)
Subjective:  Patient ID: Eugene Bautista, male    DOB: 03-04-1956  Age: 67 y.o. MRN: [id number]  CC: Medical Management of Chronic Issues   HPI Salomon Isip presents forFollow-up of diabetes. Patient checks blood sugar at home.   124-160 fasting and not normally checking postprandial. HbA1c done at home 2 weeks ago was 6.4.  Patient denies symptoms such as polyuria, polydipsia, excessive hunger, nausea No significant hypoglycemic spells noted. Medications reviewed. Pt reports taking them regularly without complication/adverse reaction being reported today.    presents for  follow-up of hypertension. Patient has no history of headache chest pain or shortness of breath or recent cough. Patient also denies symptoms of TIA such as focal numbness or weakness. Patient denies side effects from medication. States taking it regularly.   in for follow-up of elevated cholesterol. Doing well without complaints on current medication. Denies side effects of statin including myalgia and arthralgia and nausea. Currently no chest pain, shortness of breath or other cardiovascular related symptoms noted.  Also followed for testosterone deficiency. Doing well with supplement. Needs CBC.   History Raishaun has a past medical history of Carotid artery occlusion, Diabetes mellitus without complication, Hyperlipidemia, Hypertension, Left carotid bruit, and Obesity.   He has a past surgical history that includes Ruptured Aorta Repair (1989); Urinary surgery (1989); and Fracture surgery (Left, 1989).   His family history includes Aneurysm (age of onset: 31) in his sister; Cancer in his brother; Dementia in his mother; Diabetes in his father; Healthy in his daughter; Heart attack (age of onset: 75) in his father; Heart disease in his father; Kidney disease in his father.He reports that he quit smoking about 21 years ago. His smoking use included cigarettes. He has a 43.75 pack-year smoking history. He has  never used smokeless tobacco. He reports current alcohol use of about 6.0 standard drinks of alcohol per week. He reports that he does not use drugs.  Current Outpatient Medications on File Prior to Visit  Medication Sig Dispense Refill   amLODipine (NORVASC) 10 MG tablet Take 1 tablet (10 mg total) by mouth daily. 90 tablet 3   ANDROGEL 20.25 MG/1.25GM (1.62%) GEL Apply four pumps to the upper chest daily 300 g 5   aspirin 81 MG tablet Take 81 mg by mouth daily.     cholecalciferol (VITAMIN D) 1000 UNITS tablet Take 2,000 Units by mouth daily.     dapagliflozin propanediol (FARXIGA) 10 MG TABS tablet Take 1 tablet (10 mg total) by mouth daily before breakfast. 90 tablet 3   fish oil-omega-3 fatty acids 1000 MG capsule Take 1 g by mouth 2 (two) times daily.     glipiZIDE (GLUCOTROL) 10 MG tablet Take 1 tablet (10 mg total) by mouth every morning. 90 tablet 3   metFORMIN (GLUCOPHAGE) 1000 MG tablet Take 1 tablet (1,000 mg total) by mouth 2 (two) times daily with a meal. 180 tablet 3   metoprolol succinate (TOPROL-XL) 25 MG 24 hr tablet Take 1 tablet (25 mg total) by mouth daily. 90 tablet 3   rosuvastatin (CRESTOR) 10 MG tablet Take 1 tablet (10 mg total) by mouth at bedtime. 90 tablet 3   sildenafil (REVATIO) 20 MG tablet Take 2-5 pills at once, orally, with each sexual encounter 50 tablet 5   triamcinolone cream (KENALOG) 0.1 % Apply 1 application topically 3 (three) times daily. Avoid face and genitalia 45 g 0   valsartan-hydrochlorothiazide (DIOVAN-HCT) 320-25 MG tablet Take 1 tablet by mouth daily. 90 tablet  3   No current facility-administered medications on file prior to visit.    ROS Review of Systems  Constitutional:  Negative for fever.  Respiratory:  Negative for shortness of breath.   Cardiovascular:  Negative for chest pain.  Musculoskeletal:  Negative for arthralgias.  Skin:  Negative for rash.    Objective:  BP (!) 98/48   Pulse 69   Temp (!) 97.4 F (36.3 C)   Ht 6'  1" (1.854 m)   Wt 273 lb (123.8 kg)   SpO2 94%   BMI 36.02 kg/m   BP Readings from Last 3 Encounters:  06/10/22 (!) 98/48  04/23/22 136/73  04/09/22 112/64    Wt Readings from Last 3 Encounters:  06/10/22 273 lb (123.8 kg)  04/23/22 274 lb 12.8 oz (124.6 kg)  04/09/22 275 lb (124.7 kg)     Physical Exam Vitals reviewed.  Constitutional:      Appearance: He is well-developed.  HENT:     Head: Normocephalic and atraumatic.     Right Ear: External ear normal.     Left Ear: External ear normal.     Mouth/Throat:     Pharynx: No oropharyngeal exudate or posterior oropharyngeal erythema.  Eyes:     Pupils: Pupils are equal, round, and reactive to light.  Cardiovascular:     Rate and Rhythm: Normal rate and regular rhythm.     Heart sounds: No murmur heard. Pulmonary:     Effort: No respiratory distress.     Breath sounds: Normal breath sounds.  Musculoskeletal:     Cervical back: Normal range of motion and neck supple.  Neurological:     Mental Status: He is alert and oriented to person, place, and time.       Assessment & Plan:   Zahki was seen today for medical management of chronic issues.  Diagnoses and all orders for this visit:  Type 2 diabetes mellitus with diabetic nephropathy, with long-term current use of insulin -     Microalbumin / creatinine urine ratio -     Bayer DCA Hb A1c Waived -     AMB Referral to Pharmacy Medication Management  Essential hypertension -     CBC with Differential/Platelet -     CMP14+EGFR  Dyslipidemia associated with type 2 diabetes mellitus -     Lipid panel  Hypogonadism, testicular -     Testosterone,Free and Total  Other orders -     tirzepatide (MOUNJARO) 15 MG/0.5ML Pen; INJECT THE CONTENTS OF ONE PEN  SUBCUTANEOUSLY WEEKLY AS  DIRECTED      I am having Jaikob C. Rowe Robert maintain his aspirin, fish oil-omega-3 fatty acids, cholecalciferol, sildenafil, triamcinolone cream, amLODipine, dapagliflozin  propanediol, glipiZIDE, metFORMIN, metoprolol succinate, rosuvastatin, valsartan-hydrochlorothiazide, AndroGel, and Mounjaro.  Meds ordered this encounter  Medications   tirzepatide (MOUNJARO) 15 MG/0.5ML Pen    Sig: INJECT THE CONTENTS OF ONE PEN  SUBCUTANEOUSLY WEEKLY AS  DIRECTED    Dispense:  6 mL    Refill:  3    Requesting 1 year supply     Follow-up: Return in about 3 months (around 09/09/2022).  Claretta Fraise, M.D.

## 2022-06-11 LAB — MICROALBUMIN / CREATININE URINE RATIO
Creatinine, Urine: 86.5 mg/dL
Microalb/Creat Ratio: 11 mg/g creat (ref 0–29)
Microalbumin, Urine: 9.3 ug/mL

## 2022-06-12 LAB — CMP14+EGFR
ALT: 19 IU/L (ref 0–44)
AST: 13 IU/L (ref 0–40)
Albumin/Globulin Ratio: 1.8 (ref 1.2–2.2)
Albumin: 4.4 g/dL (ref 3.9–4.9)
Alkaline Phosphatase: 76 IU/L (ref 44–121)
BUN/Creatinine Ratio: 23 (ref 10–24)
BUN: 34 mg/dL — ABNORMAL HIGH (ref 8–27)
Bilirubin Total: 0.9 mg/dL (ref 0.0–1.2)
CO2: 22 mmol/L (ref 20–29)
Calcium: 9.5 mg/dL (ref 8.6–10.2)
Chloride: 101 mmol/L (ref 96–106)
Creatinine, Ser: 1.49 mg/dL — ABNORMAL HIGH (ref 0.76–1.27)
Globulin, Total: 2.5 g/dL (ref 1.5–4.5)
Glucose: 151 mg/dL — ABNORMAL HIGH (ref 70–99)
Potassium: 5.2 mmol/L (ref 3.5–5.2)
Sodium: 138 mmol/L (ref 134–144)
Total Protein: 6.9 g/dL (ref 6.0–8.5)
eGFR: 52 mL/min/{1.73_m2} — ABNORMAL LOW (ref >59)

## 2022-06-12 LAB — CBC WITH DIFFERENTIAL/PLATELET
Basophils Absolute: 0 10*3/uL (ref 0.0–0.2)
Basos: 0 %
EOS (ABSOLUTE): 0.2 10*3/uL (ref 0.0–0.4)
Eos: 3 %
Hematocrit: 47.2 % (ref 37.5–51.0)
Hemoglobin: 16.1 g/dL (ref 13.0–17.7)
Immature Grans (Abs): 0 10*3/uL (ref 0.0–0.1)
Immature Granulocytes: 0 %
Lymphocytes Absolute: 1.3 10*3/uL (ref 0.7–3.1)
Lymphs: 18 %
MCH: 31 pg (ref 26.6–33.0)
MCHC: 34.1 g/dL (ref 31.5–35.7)
MCV: 91 fL (ref 79–97)
Monocytes Absolute: 0.7 10*3/uL (ref 0.1–0.9)
Monocytes: 10 %
Neutrophils Absolute: 4.8 10*3/uL (ref 1.4–7.0)
Neutrophils: 69 %
Platelets: 209 10*3/uL (ref 150–450)
RBC: 5.19 x10E6/uL (ref 4.14–5.80)
RDW: 13.3 % (ref 11.6–15.4)
WBC: 7 10*3/uL (ref 3.4–10.8)

## 2022-06-12 LAB — LIPID PANEL
Chol/HDL Ratio: 2.3 ratio (ref 0.0–5.0)
Cholesterol, Total: 100 mg/dL (ref 100–199)
HDL: 43 mg/dL (ref >39)
LDL Chol Calc (NIH): 34 mg/dL (ref 0–99)
Triglycerides: 131 mg/dL (ref 0–149)
VLDL Cholesterol Cal: 23 mg/dL (ref 5–40)

## 2022-06-12 LAB — TESTOSTERONE,FREE AND TOTAL
Testosterone, Free: 8 pg/mL (ref 6.6–18.1)
Testosterone: 161 ng/dL — ABNORMAL LOW (ref 264–916)

## 2022-06-14 ENCOUNTER — Other Ambulatory Visit (HOSPITAL_BASED_OUTPATIENT_CLINIC_OR_DEPARTMENT_OTHER): Payer: Self-pay

## 2022-06-14 MED ORDER — MOUNJARO 15 MG/0.5ML ~~LOC~~ SOAJ
15.0000 mg | SUBCUTANEOUS | 1 refills | Status: DC
  Filled 2022-06-14: qty 2, 28d supply, fill #0
  Filled 2022-07-15: qty 2, 28d supply, fill #1

## 2022-06-15 ENCOUNTER — Telehealth: Payer: Self-pay | Admitting: Family Medicine

## 2022-06-15 NOTE — Telephone Encounter (Signed)
PATIENT AWARE AND AGREEABLE

## 2022-06-15 NOTE — Telephone Encounter (Signed)
Just have to wait for it. If glucose climbs too much (200 fasting) I can temporarily add insulin.

## 2022-06-16 ENCOUNTER — Telehealth: Payer: Self-pay

## 2022-06-16 NOTE — Progress Notes (Signed)
  Care Coordination  Note  06/16/2022 Name: Eugene Bautista MRN: [id number] DOB: 11-25-1955  Eugene Bautista is a 67 y.o. year old male who is a primary care patient of Stacks, Broadus John, MD. I reached out to Steva Ready by phone today to offer care coordination services.      Eugene Bautista was given information about Care Coordination services today including:  The Care Coordination services include support from the care team which includes your Nurse Coordinator, Clinical Social Worker, or Pharmacist.  The Care Coordination team is here to help remove barriers to the health concerns and goals most important to you. Care Coordination services are voluntary and the patient may decline or stop services at any time by request to their care team member.   Patient agreed to services and verbal consent obtained.   Follow up plan: Telephone appointment with care coordination team member scheduled for:06/17/2022  Penne Lash, RMA Care Guide [facility]  [place], [place] [address] Direct Dial: [phone] [email or web address]

## 2022-06-17 ENCOUNTER — Telehealth: Payer: Self-pay | Admitting: Pharmacist

## 2022-06-17 ENCOUNTER — Encounter: Payer: Self-pay | Admitting: Pharmacist

## 2022-06-17 ENCOUNTER — Ambulatory Visit (INDEPENDENT_AMBULATORY_CARE_PROVIDER_SITE_OTHER): Payer: Medicare Other | Admitting: Pharmacist

## 2022-06-17 DIAGNOSIS — Z794 Long term (current) use of insulin: Secondary | ICD-10-CM

## 2022-06-17 DIAGNOSIS — E1121 Type 2 diabetes mellitus with diabetic nephropathy: Secondary | ICD-10-CM

## 2022-06-17 MED ORDER — SEMAGLUTIDE (2 MG/DOSE) 8 MG/3ML ~~LOC~~ SOPN
2.0000 mg | PEN_INJECTOR | SUBCUTANEOUS | 2 refills | Status: DC
Start: 2022-06-17 — End: 2022-08-25

## 2022-06-17 NOTE — Progress Notes (Signed)
     06/17/2022 Name: Eugene Bautista MRN: [id number] DOB: 1955-07-18   S: 57 yoF Presents for diabetes evaluation, education, and management Patient was referred and last seen by Primary Care Provider on 06/10/22.  He is having difficulty finding Mounjaro due to backorders..  Insurance coverage/medication affordability: BCBS [employer]  Patient reports adherence with medications. Current diabetes medications include: mounjaro,farxiga, metformin, glipizide    Patient denies hypoglycemic events.   Patient-reported exercise habits: n/a     O:  Lab Results  Component Value Date   HGBA1C 6.5 (H) 06/10/2022     Lipid Panel     Component Value Date/Time   CHOL 100 06/10/2022 0932   CHOL 95 10/05/2012 0822   TRIG 131 06/10/2022 0932   TRIG 202 (H) 01/05/2013 0841   TRIG 142 10/05/2012 0822   HDL 43 06/10/2022 0932   HDL 36 (L) 01/05/2013 0841   HDL 37 (L) 10/05/2012 0822   CHOLHDL 2.3 06/10/2022 0932   LDLCALC 34 06/10/2022 0932   LDLCALC 25 01/05/2013 0841   LDLCALC 30 10/05/2012 0822    Clinical Atherosclerotic Cardiovascular Disease (ASCVD): No   The ASCVD Risk score (Arnett DK, et al., 2019) failed to calculate for the following reasons:   The valid total cholesterol range is 130 to 320 mg/dL    A/P:  Diabetes G2BW currently controlled, however patient cannot find Mounjaro in stock due to backorders.  His last injection of Mounjaro 15mg  was last Thursday.  Recommend to switch to Ozempic 2mg  in the meantime and call around to try and locate [facility].  Instructed patient to only take one injection per week if having to alternate between brands.  Denies personal and family history of Medullary thyroid cancer (MTC).  Continue all other medications as prescribed.  -Extensively discussed pathophysiology of diabetes, recommended lifestyle interventions, dietary effects on blood sugar control    Kieth Brightly, PharmD, BCACP Clinical Pharmacist, [facility]

## 2022-06-18 NOTE — Telephone Encounter (Signed)
Made wife aware that PA is being worked on but is still awaiting determination. Wife wants to know if it is ok for pt to stop taking until medication gets approved because he is completely out?

## 2022-06-18 NOTE — Telephone Encounter (Signed)
Patient aware and verbalizes understanding. 

## 2022-06-18 NOTE — Telephone Encounter (Signed)
PA was approved for Ozempic--please let patient know He can't find Mounjaro 15mg  in stock, so we are having to substitute

## 2022-06-22 ENCOUNTER — Other Ambulatory Visit (HOSPITAL_BASED_OUTPATIENT_CLINIC_OR_DEPARTMENT_OTHER): Payer: Self-pay

## 2022-07-15 ENCOUNTER — Other Ambulatory Visit (HOSPITAL_BASED_OUTPATIENT_CLINIC_OR_DEPARTMENT_OTHER): Payer: Self-pay

## 2022-07-29 ENCOUNTER — Other Ambulatory Visit (HOSPITAL_BASED_OUTPATIENT_CLINIC_OR_DEPARTMENT_OTHER): Payer: Self-pay

## 2022-07-30 LAB — HM DIABETES EYE EXAM

## 2022-08-06 ENCOUNTER — Other Ambulatory Visit (HOSPITAL_BASED_OUTPATIENT_CLINIC_OR_DEPARTMENT_OTHER): Payer: Self-pay

## 2022-08-06 ENCOUNTER — Encounter (HOSPITAL_BASED_OUTPATIENT_CLINIC_OR_DEPARTMENT_OTHER): Payer: Self-pay

## 2022-08-13 ENCOUNTER — Other Ambulatory Visit (HOSPITAL_BASED_OUTPATIENT_CLINIC_OR_DEPARTMENT_OTHER): Payer: Self-pay

## 2022-08-25 ENCOUNTER — Telehealth: Payer: Self-pay | Admitting: Family Medicine

## 2022-08-25 DIAGNOSIS — Z794 Long term (current) use of insulin: Secondary | ICD-10-CM

## 2022-08-25 MED ORDER — SEMAGLUTIDE (2 MG/DOSE) 8 MG/3ML ~~LOC~~ SOPN
2.0000 mg | PEN_INJECTOR | SUBCUTANEOUS | 0 refills | Status: DC
Start: 2022-08-25 — End: 2022-11-09

## 2022-08-25 NOTE — Telephone Encounter (Signed)
  Prescription Request  08/25/2022  Is this a "Controlled Substance" medicine? Ozempic 2.0  Have you seen your PCP in the last 2 weeks? Apt in July  If YES, route message to pool  -  If NO, patient needs to be scheduled for appointment.  What is the name of the medication or equipment? Ozempic 2.0  Have you contacted your pharmacy to request a refill? no   Which pharmacy would you like this sent to? [facility]  Pt asked to talk to Dr Darlyn Read nurse about the refill. He says that he is in transition from bcbs to [employer]. Please call back   Patient notified that their request is being sent to the clinical staff for review and that they should receive a response within 2 business days.

## 2022-08-25 NOTE — Telephone Encounter (Signed)
Pt aware refill sent to pharmacy 

## 2022-09-02 ENCOUNTER — Telehealth: Payer: Self-pay | Admitting: Family Medicine

## 2022-09-13 ENCOUNTER — Ambulatory Visit: Payer: Medicare HMO | Admitting: Family Medicine

## 2022-09-13 ENCOUNTER — Encounter: Payer: Self-pay | Admitting: Family Medicine

## 2022-09-13 VITALS — BP 105/50 | HR 76 | Temp 97.7°F | Ht 73.0 in | Wt 269.2 lb

## 2022-09-13 DIAGNOSIS — E1169 Type 2 diabetes mellitus with other specified complication: Secondary | ICD-10-CM | POA: Diagnosis not present

## 2022-09-13 DIAGNOSIS — Z794 Long term (current) use of insulin: Secondary | ICD-10-CM

## 2022-09-13 DIAGNOSIS — I10 Essential (primary) hypertension: Secondary | ICD-10-CM | POA: Diagnosis not present

## 2022-09-13 DIAGNOSIS — E785 Hyperlipidemia, unspecified: Secondary | ICD-10-CM | POA: Diagnosis not present

## 2022-09-13 DIAGNOSIS — E291 Testicular hypofunction: Secondary | ICD-10-CM

## 2022-09-13 DIAGNOSIS — E1121 Type 2 diabetes mellitus with diabetic nephropathy: Secondary | ICD-10-CM | POA: Diagnosis not present

## 2022-09-13 DIAGNOSIS — Z7985 Long-term (current) use of injectable non-insulin antidiabetic drugs: Secondary | ICD-10-CM

## 2022-09-13 DIAGNOSIS — Z7984 Long term (current) use of oral hypoglycemic drugs: Secondary | ICD-10-CM

## 2022-09-13 LAB — CMP14+EGFR
ALT: 25 IU/L (ref 0–44)
AST: 18 IU/L (ref 0–40)
Albumin: 4.5 g/dL (ref 3.9–4.9)
Alkaline Phosphatase: 72 IU/L (ref 44–121)
BUN/Creatinine Ratio: 22 (ref 10–24)
BUN: 28 mg/dL — ABNORMAL HIGH (ref 8–27)
Bilirubin Total: 0.7 mg/dL (ref 0.0–1.2)
CO2: 21 mmol/L (ref 20–29)
Calcium: 9.6 mg/dL (ref 8.6–10.2)
Chloride: 99 mmol/L (ref 96–106)
Creatinine, Ser: 1.28 mg/dL — ABNORMAL HIGH (ref 0.76–1.27)
Globulin, Total: 2.3 g/dL (ref 1.5–4.5)
Glucose: 169 mg/dL — ABNORMAL HIGH (ref 70–99)
Potassium: 4.7 mmol/L (ref 3.5–5.2)
Sodium: 137 mmol/L (ref 134–144)
Total Protein: 6.8 g/dL (ref 6.0–8.5)
eGFR: 62 mL/min/{1.73_m2} (ref >59)

## 2022-09-13 LAB — LIPID PANEL
Chol/HDL Ratio: 2.4 ratio (ref 0.0–5.0)
Cholesterol, Total: 115 mg/dL (ref 100–199)
HDL: 48 mg/dL (ref >39)
LDL Chol Calc (NIH): 44 mg/dL (ref 0–99)
Triglycerides: 132 mg/dL (ref 0–149)
VLDL Cholesterol Cal: 23 mg/dL (ref 5–40)

## 2022-09-13 LAB — BAYER DCA HB A1C WAIVED: HB A1C (BAYER DCA - WAIVED): 6.9 % — ABNORMAL HIGH (ref 4.8–5.6)

## 2022-09-13 LAB — CBC WITH DIFFERENTIAL/PLATELET
Basophils Absolute: 0 10*3/uL (ref 0.0–0.2)
Basos: 1 %
EOS (ABSOLUTE): 0.3 10*3/uL (ref 0.0–0.4)
Eos: 4 %
Hematocrit: 48.7 % (ref 37.5–51.0)
Hemoglobin: 16.4 g/dL (ref 13.0–17.7)
Immature Grans (Abs): 0 10*3/uL (ref 0.0–0.1)
Immature Granulocytes: 0 %
Lymphocytes Absolute: 1.5 10*3/uL (ref 0.7–3.1)
Lymphs: 21 %
MCH: 30.4 pg (ref 26.6–33.0)
MCHC: 33.7 g/dL (ref 31.5–35.7)
MCV: 90 fL (ref 79–97)
Monocytes Absolute: 0.7 10*3/uL (ref 0.1–0.9)
Monocytes: 10 %
Neutrophils Absolute: 4.6 10*3/uL (ref 1.4–7.0)
Neutrophils: 64 %
Platelets: 199 10*3/uL (ref 150–450)
RBC: 5.4 x10E6/uL (ref 4.14–5.80)
RDW: 12.8 % (ref 11.6–15.4)
WBC: 7.2 10*3/uL (ref 3.4–10.8)

## 2022-09-13 MED ORDER — VALSARTAN-HYDROCHLOROTHIAZIDE 160-12.5 MG PO TABS: 1.0000 | ORAL_TABLET | Freq: Every day | ORAL | 3 refills | Status: DC

## 2022-09-13 NOTE — Progress Notes (Signed)
Subjective:  Patient ID: Eugene Bautista,  male    DOB: [birth year]  Age: 67 y.o.    CC: Medical Management of Chronic Issues   HPI Eugene Bautista presents for  follow-up of hypertension. Patient has no history of headache chest pain or shortness of breath or recent cough. Patient also denies symptoms of TIA such as numbness weakness lateralizing. Patient denies side effects from medication. States taking it regularly.  Patient also  in for follow-up of elevated cholesterol. Doing well without complaints on current medication. Denies side effects  including myalgia and arthralgia and nausea. Also in today for liver function testing. Currently no chest pain, shortness of breath or other cardiovascular related symptoms noted.  Follow-up of diabetes. Patient does check blood sugar at home. Readings run between 180-200 fasting and 130-140 postprandial Ate more while at the beach 2 weeks ago for 4 days.  Patient denies symptoms such as excessive hunger or urinary frequency, excessive hunger, nausea No significant hypoglycemic spells noted. Medications reviewed. Pt reports taking them regularly. Pt. denies complication/adverse reaction today.    History Eugene Bautista has a past medical history of Carotid artery occlusion, Diabetes mellitus without complication (HCC), Hyperlipidemia, Hypertension, Left carotid bruit, and Obesity.   Eugene Bautista has a past surgical history that includes Ruptured Aorta Repair (1989); Urinary surgery (1989); and Fracture surgery (Left, 1989).   His family history includes Aneurysm (age of onset: 68) in his sister; Cancer in his brother; Dementia in his mother; Diabetes in his father; Healthy in his daughter; Heart attack (age of onset: 6) in his father; Heart disease in his father; Kidney disease in his father.Eugene Bautista reports that Eugene Bautista quit smoking about 21 years ago. His smoking use included cigarettes. Eugene Bautista has a 43.75 pack-year smoking history. Eugene Bautista has never used smokeless  tobacco. Eugene Bautista reports current alcohol use of about 6.0 standard drinks of alcohol per week. Eugene Bautista reports that Eugene Bautista does not use drugs.  Current Outpatient Medications on File Prior to Visit  Medication Sig Dispense Refill   amLODipine (NORVASC) 10 MG tablet Take 1 tablet (10 mg total) by mouth daily. 90 tablet 3   ANDROGEL 20.25 MG/1.25GM (1.62%) GEL Apply four pumps to the upper chest daily 300 g 5   aspirin 81 MG tablet Take 81 mg by mouth daily.     cholecalciferol (VITAMIN D) 1000 UNITS tablet Take 2,000 Units by mouth daily.     dapagliflozin propanediol (FARXIGA) 10 MG TABS tablet Take 1 tablet (10 mg total) by mouth daily before breakfast. 90 tablet 3   fish oil-omega-3 fatty acids 1000 MG capsule Take 1 g by mouth 2 (two) times daily.     glipiZIDE (GLUCOTROL) 10 MG tablet Take 1 tablet (10 mg total) by mouth every morning. 90 tablet 3   metFORMIN (GLUCOPHAGE) 1000 MG tablet Take 1 tablet (1,000 mg total) by mouth 2 (two) times daily with a meal. 180 tablet 3   metoprolol succinate (TOPROL-XL) 25 MG 24 hr tablet Take 1 tablet (25 mg total) by mouth daily. 90 tablet 3   rosuvastatin (CRESTOR) 10 MG tablet Take 1 tablet (10 mg total) by mouth at bedtime. 90 tablet 3   Semaglutide, 2 MG/DOSE, 8 MG/3ML SOPN Inject 2 mg as directed once a week. 9 mL 0   sildenafil (REVATIO) 20 MG tablet Take 2-5 pills at once, orally, with each sexual encounter 50 tablet 5   triamcinolone cream (KENALOG) 0.1 % Apply 1 application topically 3 (three) times daily. Avoid face and  genitalia 45 g 0   No current facility-administered medications on file prior to visit.    ROS Review of Systems  Constitutional:  Negative for fever.  Respiratory:  Negative for shortness of breath.   Cardiovascular:  Negative for chest pain.  Musculoskeletal:  Negative for arthralgias.  Skin:  Negative for rash.  Neurological:  Positive for light-headedness (when first standing).    Objective:  BP (!) 105/50   Pulse 76   Temp  97.7 F (36.5 C)   Ht 6\' 1"  (1.854 m)   Wt 269 lb 3.2 oz (122.1 kg)   SpO2 95%   BMI 35.52 kg/m   BP Readings from Last 3 Encounters:  09/13/22 (!) 105/50  06/10/22 (!) 98/48  04/23/22 136/73    Wt Readings from Last 3 Encounters:  09/13/22 269 lb 3.2 oz (122.1 kg)  06/10/22 273 lb (123.8 kg)  04/23/22 274 lb 12.8 oz (124.6 kg)     Physical Exam Vitals reviewed.  Constitutional:      Appearance: Eugene Bautista is well-developed.  HENT:     Head: Normocephalic and atraumatic.     Right Ear: External ear normal.     Left Ear: External ear normal.     Mouth/Throat:     Pharynx: No oropharyngeal exudate or posterior oropharyngeal erythema.  Eyes:     Pupils: Pupils are equal, round, and reactive to light.  Cardiovascular:     Rate and Rhythm: Normal rate and regular rhythm.     Heart sounds: No murmur heard. Pulmonary:     Effort: No respiratory distress.     Breath sounds: Normal breath sounds.  Musculoskeletal:     Cervical back: Normal range of motion and neck supple.  Neurological:     Mental Status: Eugene Bautista is alert and oriented to person, place, and time.     Diabetic Foot Exam - Simple   No data filed     Lab Results  Component Value Date   HGBA1C 6.5 (H) 06/10/2022   HGBA1C 8.0 (H) 03/11/2022   HGBA1C 7.8 (H) 06/22/2021    Assessment & Plan:   Eugene Bautista was seen today for medical management of chronic issues.  Diagnoses and all orders for this visit:  Type 2 diabetes mellitus with diabetic nephropathy, with long-term current use of insulin (HCC) -     Lipid panel  Essential hypertension -     CBC with Differential/Platelet -     CMP14+EGFR  Dyslipidemia associated with type 2 diabetes mellitus (HCC) -     Bayer DCA Hb A1c Waived  Hypogonadism, testicular  Other orders -     valsartan-hydrochlorothiazide (DIOVAN-HCT) 160-12.5 MG tablet; Take 1 tablet by mouth daily.   I have discontinued Eugene Garbe. Bautista's valsartan-hydrochlorothiazide and Mounjaro. I  am also having him start on valsartan-hydrochlorothiazide. Additionally, I am having him maintain his aspirin, fish oil-omega-3 fatty acids, cholecalciferol, sildenafil, triamcinolone cream, amLODipine, dapagliflozin propanediol, glipiZIDE, metFORMIN, metoprolol succinate, rosuvastatin, AndroGel, and Semaglutide (2 MG/DOSE).  Meds ordered this encounter  Medications   valsartan-hydrochlorothiazide (DIOVAN-HCT) 160-12.5 MG tablet    Sig: Take 1 tablet by mouth daily.    Dispense:  90 tablet    Refill:  3     Follow-up: Return in about 3 months (around 12/14/2022).  Mechele Claude, M.D.

## 2022-09-14 NOTE — Progress Notes (Signed)
Hello Jachob,  Your lab result is normal and/or stable.Some minor variations that are not significant are commonly marked abnormal, but do not represent any medical problem for you.  Best regards, Baby Stairs, M.D.

## 2022-11-09 ENCOUNTER — Other Ambulatory Visit: Payer: Self-pay | Admitting: Family Medicine

## 2022-11-09 DIAGNOSIS — Z794 Long term (current) use of insulin: Secondary | ICD-10-CM

## 2022-11-24 ENCOUNTER — Telehealth: Payer: Self-pay | Admitting: Family Medicine

## 2022-11-24 DIAGNOSIS — Z794 Long term (current) use of insulin: Secondary | ICD-10-CM

## 2022-11-24 DIAGNOSIS — I10 Essential (primary) hypertension: Secondary | ICD-10-CM

## 2022-11-24 MED ORDER — METFORMIN HCL 1000 MG PO TABS: 1000.0000 mg | ORAL_TABLET | Freq: Two times a day (BID) | ORAL | 0 refills | Status: DC

## 2022-11-24 MED ORDER — METOPROLOL SUCCINATE ER 25 MG PO TB24: 25.0000 mg | ORAL_TABLET | Freq: Every day | ORAL | 0 refills | Status: DC

## 2022-11-24 MED ORDER — ROSUVASTATIN CALCIUM 10 MG PO TABS: 10.0000 mg | ORAL_TABLET | Freq: Every day | ORAL | 0 refills | Status: DC

## 2022-11-24 NOTE — Telephone Encounter (Signed)
  Prescription Request  11/24/2022  Is this a "Controlled Substance" medicine? metFORMIN (GLUCOPHAGE) 1000 MG tablet  metoprolol succinate (TOPROL-XL) 25 MG 24 hr tablet  rosuvastatin (CRESTOR) 10 MG tablet   Have you seen your PCP in the last 2 weeks? Apt 12/14/2022  If YES, route message to pool  -  If NO, patient needs to be scheduled for appointment.  What is the name of the medication or equipment? metFORMIN (GLUCOPHAGE) 1000 MG tablet  metoprolol succinate (TOPROL-XL) 25 MG 24 hr tablet  rosuvastatin (CRESTOR) 10 MG tablet   Have you contacted your pharmacy to request a refill? no   Which pharmacy would you like this sent to? [facility]   Patient notified that their request is being sent to the clinical staff for review and that they should receive a response within 2 business days.

## 2022-11-24 NOTE — Telephone Encounter (Signed)
Aware refills sent to pharmacy

## 2022-12-14 ENCOUNTER — Ambulatory Visit: Payer: Medicare HMO | Admitting: Family Medicine

## 2022-12-14 ENCOUNTER — Telehealth: Payer: Self-pay | Admitting: Family Medicine

## 2022-12-14 DIAGNOSIS — I10 Essential (primary) hypertension: Secondary | ICD-10-CM

## 2022-12-14 DIAGNOSIS — E1121 Type 2 diabetes mellitus with diabetic nephropathy: Secondary | ICD-10-CM

## 2022-12-14 MED ORDER — DAPAGLIFLOZIN PROPANEDIOL 10 MG PO TABS: 10.0000 mg | ORAL_TABLET | Freq: Every day | ORAL | 0 refills | Status: DC

## 2022-12-14 MED ORDER — AMLODIPINE BESYLATE 10 MG PO TABS: 10.0000 mg | ORAL_TABLET | Freq: Every day | ORAL | 0 refills | Status: DC

## 2022-12-14 MED ORDER — GLIPIZIDE 10 MG PO TABS: 10.0000 mg | ORAL_TABLET | Freq: Every morning | ORAL | 0 refills | Status: DC

## 2022-12-14 NOTE — Telephone Encounter (Signed)
Aware refills sent to pharmacy 

## 2022-12-14 NOTE — Telephone Encounter (Signed)
  Prescription Request  12/14/2022  Is this a "Controlled Substance" medicine? NO  Have you seen your PCP in the last 2 weeks? NO  If YES, route message to pool  -  If NO, patient needs to be scheduled for appointment.  What is the name of the medication or equipment? Amlodipine 10 mg, Glipizide 10 mg, Dapagliflozin 10 mg - Provider called out and will need refills before appt.  Have you contacted your pharmacy to request a refill? NO   Which pharmacy would you like this sent to? [place]   Patient notified that their request is being sent to the clinical staff for review and that they should receive a response within 2 business days.

## 2023-01-03 ENCOUNTER — Ambulatory Visit (INDEPENDENT_AMBULATORY_CARE_PROVIDER_SITE_OTHER): Payer: Medicare HMO | Admitting: Family Medicine

## 2023-01-03 ENCOUNTER — Encounter: Payer: Self-pay | Admitting: Family Medicine

## 2023-01-03 VITALS — BP 116/67 | HR 71 | Temp 98.3°F | Ht 73.0 in | Wt 269.0 lb

## 2023-01-03 DIAGNOSIS — E785 Hyperlipidemia, unspecified: Secondary | ICD-10-CM

## 2023-01-03 DIAGNOSIS — E291 Testicular hypofunction: Secondary | ICD-10-CM | POA: Diagnosis not present

## 2023-01-03 DIAGNOSIS — E1121 Type 2 diabetes mellitus with diabetic nephropathy: Secondary | ICD-10-CM | POA: Diagnosis not present

## 2023-01-03 DIAGNOSIS — Z23 Encounter for immunization: Secondary | ICD-10-CM

## 2023-01-03 DIAGNOSIS — E1169 Type 2 diabetes mellitus with other specified complication: Secondary | ICD-10-CM

## 2023-01-03 DIAGNOSIS — I10 Essential (primary) hypertension: Secondary | ICD-10-CM | POA: Diagnosis not present

## 2023-01-03 DIAGNOSIS — Z794 Long term (current) use of insulin: Secondary | ICD-10-CM

## 2023-01-03 LAB — BAYER DCA HB A1C WAIVED: HB A1C (BAYER DCA - WAIVED): 6.9 % — ABNORMAL HIGH (ref 4.8–5.6)

## 2023-01-03 MED ORDER — DAPAGLIFLOZIN PROPANEDIOL 10 MG PO TABS: 10.0000 mg | ORAL_TABLET | Freq: Every day | ORAL | 0 refills | Status: DC

## 2023-01-03 MED ORDER — GLIPIZIDE 10 MG PO TABS: 10.0000 mg | ORAL_TABLET | Freq: Every morning | ORAL | 0 refills | Status: DC

## 2023-01-03 MED ORDER — FREESTYLE LIBRE 3 SENSOR MISC: 11 refills | Status: DC

## 2023-01-03 NOTE — Progress Notes (Signed)
Subjective:  Patient ID: Eugene Bautista,  male    DOB: 02-May-1955  Age: 67 y.o.    CC: Medical Management of Chronic Issues   HPI Navneet Pentecost presents for  follow-up of hypertension. Patient has no history of headache chest pain or shortness of breath or recent cough. Patient also denies symptoms of TIA such as numbness weakness lateralizing. Patient denies side effects from medication. States taking it regularly.  Patient also  in for follow-up of elevated cholesterol. Doing well without complaints on current medication. Denies side effects  including myalgia and arthralgia and nausea. Also in today for liver function testing. Currently no chest pain, shortness of breath or other cardiovascular related symptoms noted.  Follow-up of diabetes. Patient does check blood sugar at home. Readings run between 140 and 220 Patient denies symptoms such as excessive hunger or urinary frequency, excessive hunger, nausea No significant hypoglycemic spells noted. Medications reviewed. Pt reports taking them regularly. Pt. denies complication/adverse reaction today.   Dced androgel as ineffective.    History Almer has a past medical history of Carotid artery occlusion, Diabetes mellitus without complication (HCC), Hyperlipidemia, Hypertension, Left carotid bruit, and Obesity.   He has a past surgical history that includes Ruptured Aorta Repair (1989); Urinary surgery (1989); and Fracture surgery (Left, 1989).   His family history includes Aneurysm (age of onset: 33) in his sister; Cancer in his brother; Dementia in his mother; Diabetes in his father; Healthy in his daughter; Heart attack (age of onset: 41) in his father; Heart disease in his father; Kidney disease in his father.He reports that he quit smoking about 21 years ago. His smoking use included cigarettes. He started smoking about 46 years ago. He has a 43.8 pack-year smoking history. He has never used smokeless tobacco. He  reports current alcohol use of about 6.0 standard drinks of alcohol per week. He reports that he does not use drugs.  Current Outpatient Medications on File Prior to Visit  Medication Sig Dispense Refill   amLODipine (NORVASC) 10 MG tablet Take 1 tablet (10 mg total) by mouth daily. 90 tablet 0   ANDROGEL 20.25 MG/1.25GM (1.62%) GEL Apply four pumps to the upper chest daily 300 g 5   aspirin 81 MG tablet Take 81 mg by mouth daily.     cholecalciferol (VITAMIN D) 1000 UNITS tablet Take 2,000 Units by mouth daily.     dapagliflozin propanediol (FARXIGA) 10 MG TABS tablet Take 1 tablet (10 mg total) by mouth daily before breakfast. 90 tablet 0   fish oil-omega-3 fatty acids 1000 MG capsule Take 1 g by mouth 2 (two) times daily.     glipiZIDE (GLUCOTROL) 10 MG tablet Take 1 tablet (10 mg total) by mouth every morning. 90 tablet 0   metFORMIN (GLUCOPHAGE) 1000 MG tablet Take 1 tablet (1,000 mg total) by mouth 2 (two) times daily with a meal. 180 tablet 0   metoprolol succinate (TOPROL-XL) 25 MG 24 hr tablet Take 1 tablet (25 mg total) by mouth daily. 90 tablet 0   OZEMPIC, 2 MG/DOSE, 8 MG/3ML SOPN INJECT 2 MG SUBCUTANEOUSLY ONCE A WEEK 9 mL 0   rosuvastatin (CRESTOR) 10 MG tablet Take 1 tablet (10 mg total) by mouth at bedtime. 90 tablet 0   sildenafil (REVATIO) 20 MG tablet Take 2-5 pills at once, orally, with each sexual encounter 50 tablet 5   triamcinolone cream (KENALOG) 0.1 % Apply 1 application topically 3 (three) times daily. Avoid face and genitalia 45 g  0   valsartan-hydrochlorothiazide (DIOVAN-HCT) 160-12.5 MG tablet Take 1 tablet by mouth daily. 90 tablet 3   No current facility-administered medications on file prior to visit.    ROS Review of Systems  Constitutional:  Negative for fever.  Respiratory:  Negative for shortness of breath.   Cardiovascular:  Negative for chest pain.  Musculoskeletal:  Negative for arthralgias.  Skin:  Negative for rash.    Objective:  BP 116/67    Pulse 71   Temp 98.3 F (36.8 C)   Ht 6\' 1"  (1.854 m)   Wt 269 lb (122 kg)   SpO2 94%   BMI 35.49 kg/m   BP Readings from Last 3 Encounters:  01/03/23 116/67  09/13/22 (!) 105/50  06/10/22 (!) 98/48    Wt Readings from Last 3 Encounters:  01/03/23 269 lb (122 kg)  09/13/22 269 lb 3.2 oz (122.1 kg)  06/10/22 273 lb (123.8 kg)     Physical Exam Vitals reviewed.  Constitutional:      Appearance: He is well-developed.  HENT:     Head: Normocephalic and atraumatic.     Right Ear: External ear normal.     Left Ear: External ear normal.     Mouth/Throat:     Pharynx: No oropharyngeal exudate or posterior oropharyngeal erythema.  Eyes:     Pupils: Pupils are equal, round, and reactive to light.  Cardiovascular:     Rate and Rhythm: Normal rate and regular rhythm.     Heart sounds: No murmur heard. Pulmonary:     Effort: No respiratory distress.     Breath sounds: Normal breath sounds.  Musculoskeletal:     Cervical back: Normal range of motion and neck supple.  Neurological:     Mental Status: He is alert and oriented to person, place, and time.     Diabetic Foot Exam - Simple   No data filed     Lab Results  Component Value Date   HGBA1C 6.9 (H) 09/13/2022   HGBA1C 6.5 (H) 06/10/2022   HGBA1C 8.0 (H) 03/11/2022    Assessment & Plan:   Tandy was seen today for medical management of chronic issues.  Diagnoses and all orders for this visit:  Type 2 diabetes mellitus with diabetic nephropathy, with long-term current use of insulin (HCC) -     CMP14+EGFR -     Bayer DCA Hb A1c Waived -     Bayer DCA Hb A1c Waived -     CBC with Differential/Platelet -     CMP14+EGFR -     Lipid panel  Essential hypertension -     CBC with Differential/Platelet  Dyslipidemia associated with type 2 diabetes mellitus (HCC) -     Lipid panel -     Lipid panel  Hypogonadism, testicular -     Testosterone,Free and Total   I am having [place] C. Christin Fudge maintain his  aspirin, fish oil-omega-3 fatty acids, cholecalciferol, sildenafil, triamcinolone cream, AndroGel, valsartan-hydrochlorothiazide, Ozempic (2 MG/DOSE), metFORMIN, metoprolol succinate, rosuvastatin, amLODipine, dapagliflozin propanediol, and glipiZIDE.  No orders of the defined types were placed in this encounter.    Follow-up: Return in about 3 months (around 04/05/2023).  Mechele Claude, M.D.

## 2023-01-04 LAB — MICROALBUMIN / CREATININE URINE RATIO
Creatinine, Urine: 54.2 mg/dL
Microalb/Creat Ratio: 18 mg/g{creat} (ref 0–29)
Microalbumin, Urine: 9.8 ug/mL

## 2023-01-06 ENCOUNTER — Telehealth: Payer: Self-pay

## 2023-01-06 LAB — LIPID PANEL
Chol/HDL Ratio: 2.4 ratio (ref 0.0–5.0)
Cholesterol, Total: 111 mg/dL (ref 100–199)
HDL: 47 mg/dL (ref >39)
LDL Chol Calc (NIH): 39 mg/dL (ref 0–99)
Triglycerides: 150 mg/dL — ABNORMAL HIGH (ref 0–149)
VLDL Cholesterol Cal: 25 mg/dL (ref 5–40)

## 2023-01-06 LAB — CMP14+EGFR
ALT: 29 IU/L (ref 0–44)
AST: 24 [IU]/L (ref 0–40)
Albumin: 4.6 g/dL (ref 3.9–4.9)
Alkaline Phosphatase: 77 IU/L (ref 44–121)
BUN/Creatinine Ratio: 17 (ref 10–24)
BUN: 22 mg/dL (ref 8–27)
Bilirubin Total: 1 mg/dL (ref 0.0–1.2)
CO2: 23 mmol/L (ref 20–29)
Calcium: 9.6 mg/dL (ref 8.6–10.2)
Chloride: 94 mmol/L — ABNORMAL LOW (ref 96–106)
Creatinine, Ser: 1.29 mg/dL — ABNORMAL HIGH (ref 0.76–1.27)
Globulin, Total: 2.2 g/dL (ref 1.5–4.5)
Glucose: 163 mg/dL — ABNORMAL HIGH (ref 70–99)
Potassium: 4.3 mmol/L (ref 3.5–5.2)
Sodium: 134 mmol/L (ref 134–144)
Total Protein: 6.8 g/dL (ref 6.0–8.5)
eGFR: 62 mL/min/{1.73_m2} (ref >59)

## 2023-01-06 LAB — CBC WITH DIFFERENTIAL/PLATELET
Basophils Absolute: 0 10*3/uL (ref 0.0–0.2)
Basos: 0 %
EOS (ABSOLUTE): 0.4 10*3/uL (ref 0.0–0.4)
Eos: 5 %
Hematocrit: 51.5 % — ABNORMAL HIGH (ref 37.5–51.0)
Hemoglobin: 16.9 g/dL (ref 13.0–17.7)
Immature Grans (Abs): 0 10*3/uL (ref 0.0–0.1)
Immature Granulocytes: 0 %
Lymphocytes Absolute: 1.3 10*3/uL (ref 0.7–3.1)
Lymphs: 18 %
MCH: 30 pg (ref 26.6–33.0)
MCHC: 32.8 g/dL (ref 31.5–35.7)
MCV: 91 fL (ref 79–97)
Monocytes Absolute: 0.7 10*3/uL (ref 0.1–0.9)
Monocytes: 10 %
Neutrophils Absolute: 4.7 10*3/uL (ref 1.4–7.0)
Neutrophils: 67 %
Platelets: 194 10*3/uL (ref 150–450)
RBC: 5.64 x10E6/uL (ref 4.14–5.80)
RDW: 12.8 % (ref 11.6–15.4)
WBC: 7.2 10*3/uL (ref 3.4–10.8)

## 2023-01-06 LAB — TESTOSTERONE,FREE AND TOTAL
Testosterone, Free: 10.3 pg/mL (ref 6.6–18.1)
Testosterone: 192 ng/dL — ABNORMAL LOW (ref 264–916)

## 2023-01-06 NOTE — Telephone Encounter (Signed)
Eugene Bautista (Key: [id number]) PA Case ID #: [id number] Rx #: [id number] Need Help? Call us at [phone] Status sent iconSent to Plan today Drug FreeStyle Libre 3 Sensor ePA cloud logo Form [facility] Electronic PA Form Original Claim Info [id number] PA REQD CALL [phone] ACCU-CHEK OR TRUE METRIXPREFER DEXCOM OR LIBRE

## 2023-01-17 ENCOUNTER — Other Ambulatory Visit (HOSPITAL_COMMUNITY): Payer: Self-pay

## 2023-01-17 NOTE — Telephone Encounter (Signed)
Pharmacy Patient Advocate Encounter  Received notification from [facility] that Prior Authorization for FreeStyle Libre 3 Sensor has been APPROVED from 1.1.24 to 12.31.25. Ran test claim, Copay is $25.65. This test claim was processed through [facility]- copay amounts may vary at other pharmacies due to pharmacy/plan contracts, or as the patient moves through the different stages of their insurance plan.

## 2023-02-05 ENCOUNTER — Other Ambulatory Visit: Payer: Self-pay | Admitting: Family Medicine

## 2023-02-05 DIAGNOSIS — Z794 Long term (current) use of insulin: Secondary | ICD-10-CM

## 2023-03-07 ENCOUNTER — Other Ambulatory Visit: Payer: Self-pay | Admitting: Family Medicine

## 2023-03-07 ENCOUNTER — Telehealth: Payer: Self-pay

## 2023-03-07 DIAGNOSIS — E1121 Type 2 diabetes mellitus with diabetic nephropathy: Secondary | ICD-10-CM

## 2023-03-07 DIAGNOSIS — I10 Essential (primary) hypertension: Secondary | ICD-10-CM

## 2023-03-07 MED ORDER — METOPROLOL SUCCINATE ER 25 MG PO TB24: 25.0000 mg | ORAL_TABLET | Freq: Every day | ORAL | 0 refills | Status: DC

## 2023-03-07 MED ORDER — AMLODIPINE BESYLATE 10 MG PO TABS: 10.0000 mg | ORAL_TABLET | Freq: Every day | ORAL | 0 refills | Status: DC

## 2023-03-07 MED ORDER — DAPAGLIFLOZIN PROPANEDIOL 10 MG PO TABS: 10.0000 mg | ORAL_TABLET | Freq: Every day | ORAL | 0 refills | Status: DC

## 2023-03-07 MED ORDER — ROSUVASTATIN CALCIUM 10 MG PO TABS: 10.0000 mg | ORAL_TABLET | Freq: Every day | ORAL | 0 refills | Status: DC

## 2023-03-07 MED ORDER — METFORMIN HCL 1000 MG PO TABS: 1000.0000 mg | ORAL_TABLET | Freq: Two times a day (BID) | ORAL | 0 refills | Status: DC

## 2023-03-07 NOTE — Telephone Encounter (Signed)
Copied from CRM [id number]. Topic: Clinical - Medication Refill >> Mar 07, 2023  9:36 AM Gildardo Pounds wrote: Most Recent Primary Care Visit:  Provider: Mechele Claude  Department: [facility]  Visit Type: OFFICE VISIT  Date: 01/03/2023  Medication: ***  Has the patient contacted their pharmacy?  (Agent: If no, request that the patient contact the pharmacy for the refill. If patient does not wish to contact the pharmacy document the reason why and proceed with request.) (Agent: If yes, when and what did the pharmacy advise?)  Is this the correct pharmacy for this prescription?  If no, delete pharmacy and type the correct one.  This is the patient's preferred pharmacy:  [id number] - [place], [place] - [address] [place] [address] Phone: [phone] Fax: [phone]  [facility]) - [place], [place] - [address] [facility] [address] [place] [address] [place] [address] Phone: [phone] Fax: [phone]  [facility] [place], [place] - [place] [address] [address] Phone: [phone] Fax: [phone]  [facility] - [place], [place] - [address] [place] [address] Phone: [phone] Fax: [phone]  [facility] [address] [place] - [place] [address] [place] [address] [place] [address] Phone: [phone] Fax: [phone]   Has the prescription been filled recently?   Is the patient out of the medication?   Has the patient been seen for an appointment in the last year OR does the patient have an upcoming appointment?   Can we respond through MyChart?   Agent: Please be advised that Rx refills may take up to 3 business days. We ask that you follow-up with your pharmacy.

## 2023-03-07 NOTE — Telephone Encounter (Signed)
No medication listed

## 2023-03-07 NOTE — Telephone Encounter (Signed)
Copied from CRM [id number]. Topic: Clinical - Medication Refill >> Mar 07, 2023  9:33 AM Gildardo Pounds wrote: Most Recent Primary Care Visit:  Provider: Mechele Claude  Department: [facility]  Visit Type: OFFICE VISIT  Date: 01/03/2023  Medication: rosuvastatin (CRESTOR) 10 MG tablet metFORMIN (GLUCOPHAGE) 1000 MG tablet metoprolol succinate (TOPROL-XL) 25 MG 24 hr tablet amLODipine (NORVASC) 10 MG tablet  dapagliflozin propanediol (FARXIGA) 10 MG TABS tablet  Has the patient contacted their pharmacy? No (Agent: If no, request that the patient contact the pharmacy for the refill. If patient does not wish to contact the pharmacy document the reason why and proceed with request.) (Agent: If yes, when and what did the pharmacy advise?)  Is this the correct pharmacy for this prescription? Yes If no, delete pharmacy and type the correct one.  This is the patient's preferred pharmacy:   [facility] -  [address], [place] - [address] [place] [address] [place] [address] Phone: [phone] Fax: [phone]   Has the prescription been filled recently? No  Is the patient out of the medication? Yes  Has the patient been seen for an appointment in the last year OR does the patient have an upcoming appointment? Yes  Can we respond through MyChart? No  Agent: Please be advised that Rx refills may take up to 3 business days. We ask that you follow-up with your pharmacy.

## 2023-03-07 NOTE — Telephone Encounter (Signed)
Message in rx request

## 2023-03-14 ENCOUNTER — Other Ambulatory Visit (HOSPITAL_COMMUNITY): Payer: Self-pay

## 2023-03-14 ENCOUNTER — Encounter: Payer: Self-pay | Admitting: Family Medicine

## 2023-03-14 ENCOUNTER — Ambulatory Visit (INDEPENDENT_AMBULATORY_CARE_PROVIDER_SITE_OTHER): Payer: PPO | Admitting: Family Medicine

## 2023-03-14 VITALS — BP 114/56 | HR 76 | Temp 98.7°F | Resp 24 | Ht 72.0 in | Wt 277.0 lb

## 2023-03-14 DIAGNOSIS — Z1211 Encounter for screening for malignant neoplasm of colon: Secondary | ICD-10-CM

## 2023-03-14 DIAGNOSIS — Z0000 Encounter for general adult medical examination without abnormal findings: Secondary | ICD-10-CM | POA: Diagnosis not present

## 2023-03-14 MED ORDER — TIRZEPATIDE 15 MG/0.5ML ~~LOC~~ SOAJ: 15.0000 mg | SUBCUTANEOUS | 3 refills | Status: DC

## 2023-03-14 MED ORDER — VALSARTAN-HYDROCHLOROTHIAZIDE 160-12.5 MG PO TABS: 1.0000 | ORAL_TABLET | Freq: Every day | ORAL | 3 refills | Status: DC

## 2023-03-14 NOTE — Progress Notes (Signed)
 Annual Wellness Visit     Patient: Eugene Bautista, Male    DOB: December 05, 1955, 68 y.o.   MRN: [id number]  Subjective  No chief complaint on file.   Eugene Bautista is a 68 y.o. male who presents today for his Annual Wellness Visit. He reports consuming a general diet. Gym/ health club routine includes light weights. He generally feels well. He reports sleeping well. He does have additional problems to discuss today.   HPI       Medications: Outpatient Medications Prior to Visit  Medication Sig   amLODipine  (NORVASC ) 10 MG tablet Take 1 tablet (10 mg total) by mouth daily.   aspirin 81 MG tablet Take 81 mg by mouth daily.   cholecalciferol (VITAMIN D ) 1000 UNITS tablet Take 2,000 Units by mouth daily.   Continuous Glucose Sensor (FREESTYLE LIBRE 3 SENSOR) MISC Place 1 sensor on the skin every 14 days. Use to check glucose continuously   dapagliflozin  propanediol (FARXIGA ) 10 MG TABS tablet Take 1 tablet (10 mg total) by mouth daily before breakfast.   fish oil-omega-3 fatty acids 1000 MG capsule Take 1 g by mouth 2 (two) times daily.   glipiZIDE  (GLUCOTROL ) 10 MG tablet Take 1 tablet (10 mg total) by mouth every morning.   metFORMIN  (GLUCOPHAGE ) 1000 MG tablet Take 1 tablet (1,000 mg total) by mouth 2 (two) times daily with a meal.   metoprolol  succinate (TOPROL -XL) 25 MG 24 hr tablet Take 1 tablet (25 mg total) by mouth daily.   rosuvastatin  (CRESTOR ) 10 MG tablet Take 1 tablet (10 mg total) by mouth at bedtime.   triamcinolone  cream (KENALOG ) 0.1 % Apply 1 application topically 3 (three) times daily. Avoid face and genitalia   valsartan -hydrochlorothiazide  (DIOVAN -HCT) 160-12.5 MG tablet Take 1 tablet by mouth daily.   [DISCONTINUED] OZEMPIC , 2 MG/DOSE, 8 MG/3ML SOPN INJECT 2 MG SUBCUTANEOUSLY ONCE A WEEK   [DISCONTINUED] sildenafil  (REVATIO ) 20 MG tablet Take 2-5 pills at once, orally, with each sexual encounter   No facility-administered medications prior to visit.     Allergies  Allergen Reactions   Atorvastatin Other (See Comments)    Myalgias- Shoulder's Hurt    Patient Care Team: Zollie Lowers, MD as PCP - General (Family Medicine)  ROS      Objective  BP (!) 114/56   Pulse 76   Temp 98.7 F (37.1 C) (Skin)   Resp (!) 24   Ht 6' (1.829 m)   Wt 277 lb (125.6 kg)   BMI 37.57 kg/m    Physical Exam    Most recent functional status assessment:    03/14/2023   10:15 AM  In your present state of health, do you have any difficulty performing the following activities:  Hearing? 0  Vision? 1  Difficulty concentrating or making decisions? 0  Walking or climbing stairs? 0  Dressing or bathing? 0  Doing errands, shopping? 0  Preparing Food and eating ? N  Using the Toilet? N  In the past six months, have you accidently leaked urine? N  Do you have problems with loss of bowel control? N  Managing your Medications? N  Managing your Finances? N  Housekeeping or managing your Housekeeping? N   Most recent fall risk assessment:    01/03/2023    8:39 AM  Fall Risk   Falls in the past year? 0  Number falls in past yr: 0  Injury with Fall? 0  Risk for fall due to : No Fall Risks  Follow up Falls evaluation completed    Most recent depression screenings:    03/14/2023   10:29 AM 01/03/2023    8:39 AM  PHQ 2/9 Scores  PHQ - 2 Score 0 1  PHQ- 9 Score  3   Most recent cognitive screening:     No data to display         Most recent Audit-C alcohol use screening    03/14/2023   10:24 AM  Alcohol Use Disorder Test (AUDIT)  1. How often do you have a drink containing alcohol? 2  2. How many drinks containing alcohol do you have on a typical day when you are drinking? 0  3. How often do you have six or more drinks on one occasion? 0  AUDIT-C Score 2   A score of 3 or more in women, and 4 or more in men indicates increased risk for alcohol abuse, EXCEPT if all of the points are from question 1   Vision/Hearing  Screen: No results found.     Assessment & Plan   Annual wellness visit done today including the all of the following: Reviewed patient's Family Medical History Reviewed and updated list of patient's medical providers Assessment of cognitive impairment was done Assessed patient's functional ability Established a written schedule for health screening services Health Risk Assessent Completed and Reviewed  Exercise Activities and Dietary recommendations  Goals   None     Immunization History  Administered Date(s) Administered   Fluad Quad(high Dose 65+) 03/11/2022   Fluad Trivalent(High Dose 65+) 01/03/2023   Influenza, Quadrivalent, Recombinant, Inj, Pf 12/30/2018   Influenza,inj,Quad PF,6+ Mos 01/14/2017, 03/28/2018, 12/05/2020   Moderna Sars-Covid-2 Vaccination 11/07/2019   Td 12/28/2010   Tdap 12/28/2010   Zoster Recombinant(Shingrix ) 03/19/2021, 06/22/2021    Health Maintenance  Topic Date Due   Pneumonia Vaccine 29+ Years old (1 of 2 - PCV) Never done   Colonoscopy  Never done   DTaP/Tdap/Td (3 - Td or Tdap) 12/27/2020   FOOT EXAM  06/23/2022   COVID-19 Vaccine (2 - 2024-25 season) 11/07/2022   HEMOGLOBIN A1C  07/04/2023   OPHTHALMOLOGY EXAM  07/30/2023   Diabetic kidney evaluation - eGFR measurement  01/03/2024   Diabetic kidney evaluation - Urine ACR  01/03/2024   INFLUENZA VACCINE  Completed   Hepatitis C Screening  Completed   Zoster Vaccines- Shingrix   Completed   HPV VACCINES  Aged Out     Discussed health benefits of physical activity, and encouraged him to engage in regular exercise appropriate for his age and condition.    Problem List Items Addressed This Visit   None Visit Diagnoses       Screen for colon cancer    -  Primary   Relevant Orders   Ambulatory referral to Gastroenterology       Return in about 1 month (around 04/14/2023) for diabetes.  Annual goal. Weight loss 30 lb.    Butler Der, MD

## 2023-03-14 NOTE — Progress Notes (Signed)
medic

## 2023-03-28 ENCOUNTER — Other Ambulatory Visit (HOSPITAL_COMMUNITY): Payer: Self-pay

## 2023-03-28 ENCOUNTER — Telehealth: Payer: Self-pay | Admitting: Family Medicine

## 2023-03-28 NOTE — Telephone Encounter (Unsigned)
Copied from CRM [id number]. Topic: General - Other >> Mar 28, 2023 10:17 AM Adelina Mings wrote: Reason for CRM: [employer] has a prior authorization for tirzepatide [facility]) 15 MG/0.5ML Pen call back number [phone]

## 2023-03-31 ENCOUNTER — Telehealth: Payer: Self-pay

## 2023-03-31 ENCOUNTER — Other Ambulatory Visit (HOSPITAL_COMMUNITY): Payer: Self-pay

## 2023-03-31 NOTE — Telephone Encounter (Signed)
Additional information has been requested from the patient's insurance in order to proceed with the prior authorization request. Requested information has been sent, or form has been filled out and faxed back to [phone]   For follow-up: [phone]

## 2023-03-31 NOTE — Telephone Encounter (Signed)
Additional information has been requested from the patient's insurance in order to proceed with the prior authorization request. Requested information has been sent, or form has been filled out and faxed back to [phone]   New Encounter created for follow up. For additional info see Pharmacy Prior Auth telephone encounter from 03/31/23.

## 2023-03-31 NOTE — Telephone Encounter (Signed)
Pt came in stating that he got a call from someone at [facility] yesterday stating that they denied his PA for his Mounjaro. Pt is confused because he's being told first that the PA was approved and now is being told that it wasn't. Can someone advise on this.

## 2023-04-06 ENCOUNTER — Other Ambulatory Visit (HOSPITAL_COMMUNITY): Payer: Self-pay

## 2023-04-06 NOTE — Telephone Encounter (Signed)
Per test claim: Refill too soon. PA is not needed at this time. Medication was filled 04/05/23. Next eligible fill date is 06/07/23.

## 2023-04-14 ENCOUNTER — Ambulatory Visit: Payer: Self-pay | Admitting: Family Medicine

## 2023-05-05 ENCOUNTER — Ambulatory Visit: Payer: PPO | Admitting: Family Medicine

## 2023-05-05 ENCOUNTER — Encounter: Payer: Self-pay | Admitting: Family Medicine

## 2023-05-05 VITALS — BP 113/65 | HR 69 | Temp 97.3°F | Ht 72.0 in | Wt 270.0 lb

## 2023-05-05 DIAGNOSIS — R0683 Snoring: Secondary | ICD-10-CM | POA: Diagnosis not present

## 2023-05-05 DIAGNOSIS — Z23 Encounter for immunization: Secondary | ICD-10-CM

## 2023-05-05 DIAGNOSIS — I10 Essential (primary) hypertension: Secondary | ICD-10-CM | POA: Diagnosis not present

## 2023-05-05 DIAGNOSIS — E1121 Type 2 diabetes mellitus with diabetic nephropathy: Secondary | ICD-10-CM | POA: Diagnosis not present

## 2023-05-05 DIAGNOSIS — Z794 Long term (current) use of insulin: Secondary | ICD-10-CM

## 2023-05-05 DIAGNOSIS — E1169 Type 2 diabetes mellitus with other specified complication: Secondary | ICD-10-CM

## 2023-05-05 DIAGNOSIS — E785 Hyperlipidemia, unspecified: Secondary | ICD-10-CM | POA: Diagnosis not present

## 2023-05-05 LAB — BAYER DCA HB A1C WAIVED: HB A1C (BAYER DCA - WAIVED): 6.2 % — ABNORMAL HIGH (ref 4.8–5.6)

## 2023-05-05 LAB — LIPID PANEL

## 2023-05-05 MED ORDER — METOPROLOL SUCCINATE ER 25 MG PO TB24: 25.0000 mg | ORAL_TABLET | Freq: Every day | ORAL | 0 refills | Status: DC

## 2023-05-05 MED ORDER — DAPAGLIFLOZIN PROPANEDIOL 10 MG PO TABS: 10.0000 mg | ORAL_TABLET | Freq: Every day | ORAL | 0 refills | Status: DC

## 2023-05-05 MED ORDER — ROSUVASTATIN CALCIUM 10 MG PO TABS: 10.0000 mg | ORAL_TABLET | Freq: Every day | ORAL | 0 refills | Status: DC

## 2023-05-05 MED ORDER — TIRZEPATIDE 15 MG/0.5ML ~~LOC~~ SOAJ: 15.0000 mg | SUBCUTANEOUS | 3 refills | Status: AC

## 2023-05-05 MED ORDER — METFORMIN HCL 1000 MG PO TABS: 1000.0000 mg | ORAL_TABLET | Freq: Two times a day (BID) | ORAL | 0 refills | Status: DC

## 2023-05-05 MED ORDER — AMLODIPINE BESYLATE 10 MG PO TABS: 10.0000 mg | ORAL_TABLET | Freq: Every day | ORAL | 0 refills | Status: DC

## 2023-05-05 MED ORDER — GLIPIZIDE 10 MG PO TABS: 10.0000 mg | ORAL_TABLET | Freq: Every morning | ORAL | 0 refills | Status: DC

## 2023-05-05 MED ORDER — TRAZODONE HCL 150 MG PO TABS: ORAL_TABLET | ORAL | 5 refills | Status: DC

## 2023-05-05 NOTE — Progress Notes (Signed)
 Subjective:  Patient ID: Eugene Bautista,  male    DOB: Jan 02, 1956  Age: 68 y.o.    CC: No chief complaint on file.   HPI Eugene Bautista presents for  follow-up of hypertension. Patient has no history of headache chest pain or shortness of breath or recent cough. Patient also denies symptoms of TIA such as numbness weakness lateralizing. Patient denies side effects from medication. States taking it regularly.  Patient also  in for follow-up of elevated cholesterol. Doing well without complaints on current medication. Denies side effects  including myalgia and arthralgia and nausea. Also in today for liver function testing. Currently no chest pain, shortness of breath or other cardiovascular related symptoms noted.  Follow-up of diabetes. Patient does check blood sugar at home. Readings run between 150 fasting  and 160 prandial  Patient denies symptoms such as excessive hunger or urinary frequency, excessive hunger, nausea Occasional drops below 70 overnight (X3 in 2 mos) Otherwise no significant hypoglycemic spells noted. Medications reviewed. Pt reports taking them regularly. Pt. denies complication/adverse reaction today.    History Eugene Bautista has a past medical history of Carotid artery occlusion, Diabetes mellitus without complication (HCC), Hyperlipidemia, Hypertension, Left carotid bruit, and Obesity.   Eugene Bautista has a past surgical history that includes Ruptured Aorta Repair (1989); Urinary surgery (1989); and Fracture surgery (Left, 1989).   His family history includes Aneurysm (age of onset: 48) in his sister; Cancer in his brother; Dementia in his mother; Diabetes in his father; Healthy in his daughter; Heart attack (age of onset: 30) in his father; Heart disease in his father; Kidney disease in his father.Eugene Bautista reports that Eugene Bautista quit smoking about 22 years ago. His smoking use included cigarettes. Eugene Bautista started smoking about 47 years ago. Eugene Bautista has a 43.8 pack-year smoking history. Eugene Bautista has  never used smokeless tobacco. Eugene Bautista reports current alcohol use of about 6.0 standard drinks of alcohol per week. Eugene Bautista reports that Eugene Bautista does not use drugs.  Current Outpatient Medications on File Prior to Visit  Medication Sig Dispense Refill   amLODipine (NORVASC) 10 MG tablet Take 1 tablet (10 mg total) by mouth daily. 90 tablet 0   aspirin 81 MG tablet Take 81 mg by mouth daily.     cholecalciferol (VITAMIN D) 1000 UNITS tablet Take 2,000 Units by mouth daily.     Continuous Glucose Sensor (FREESTYLE LIBRE 3 SENSOR) MISC Place 1 sensor on the skin every 14 days. Use to check glucose continuously 2 each 11   dapagliflozin propanediol (FARXIGA) 10 MG TABS tablet Take 1 tablet (10 mg total) by mouth daily before breakfast. 90 tablet 0   glipiZIDE (GLUCOTROL) 10 MG tablet Take 1 tablet (10 mg total) by mouth every morning. 90 tablet 0   metFORMIN (GLUCOPHAGE) 1000 MG tablet Take 1 tablet (1,000 mg total) by mouth 2 (two) times daily with a meal. 180 tablet 0   metoprolol succinate (TOPROL-XL) 25 MG 24 hr tablet Take 1 tablet (25 mg total) by mouth daily. 90 tablet 0   rosuvastatin (CRESTOR) 10 MG tablet Take 1 tablet (10 mg total) by mouth at bedtime. 90 tablet 0   tirzepatide (MOUNJARO) 15 MG/0.5ML Pen Inject 15 mg into the skin once a week. 6 mL 3   triamcinolone cream (KENALOG) 0.1 % Apply 1 application topically 3 (three) times daily. Avoid face and genitalia 45 g 0   valsartan-hydrochlorothiazide (DIOVAN-HCT) 160-12.5 MG tablet Take 1 tablet by mouth daily. 90 tablet 3   fish oil-omega-3 fatty  acids 1000 MG capsule Take 1 g by mouth 2 (two) times daily. (Patient not taking: Reported on 05/05/2023)     No current facility-administered medications on file prior to visit.    ROS Review of Systems  Constitutional:  Negative for fever.  Respiratory:  Negative for shortness of breath.   Cardiovascular:  Negative for chest pain.  Musculoskeletal:  Negative for arthralgias.  Skin:  Negative for rash.   Psychiatric/Behavioral:  Positive for sleep disturbance (awakening multiple times during the night).     Objective:  BP 113/65   Pulse 69   Temp (!) 97.3 F (36.3 C)   Ht 6' (1.829 m)   Wt 270 lb (122.5 kg)   SpO2 96%   BMI 36.62 kg/m   BP Readings from Last 3 Encounters:  05/05/23 113/65  03/14/23 (!) 114/56  01/03/23 116/67    Wt Readings from Last 3 Encounters:  05/05/23 270 lb (122.5 kg)  03/14/23 277 lb (125.6 kg)  01/03/23 269 lb (122 kg)     Physical Exam Vitals reviewed.  Constitutional:      Appearance: Eugene Bautista is well-developed.  HENT:     Head: Normocephalic and atraumatic.     Right Ear: External ear normal.     Left Ear: External ear normal.     Mouth/Throat:     Pharynx: No oropharyngeal exudate or posterior oropharyngeal erythema.  Eyes:     Pupils: Pupils are equal, round, and reactive to light.  Cardiovascular:     Rate and Rhythm: Normal rate and regular rhythm.     Heart sounds: No murmur heard. Pulmonary:     Effort: No respiratory distress.     Breath sounds: Normal breath sounds.  Musculoskeletal:     Cervical back: Normal range of motion and neck supple.  Neurological:     Mental Status: Eugene Bautista is alert and oriented to person, place, and time.     Diabetic Foot Exam - Simple   No data filed     Lab Results  Component Value Date   HGBA1C 6.9 (H) 01/03/2023   HGBA1C 6.9 (H) 09/13/2022   HGBA1C 6.5 (H) 06/10/2022    Assessment & Plan:   Diagnoses and all orders for this visit:  Type 2 diabetes mellitus with diabetic nephropathy, with long-term current use of insulin (HCC) -     Bayer DCA Hb A1c Waived -     Microalbumin/Creatinine Ratio, Urine  Essential hypertension -     CMP14+EGFR -     CBC with Differential/Platelet  Dyslipidemia associated with type 2 diabetes mellitus (HCC) -     CMP14+EGFR -     Lipid panel  Immunization due -     Pneumococcal conjugate vaccine 20-valent (Prevnar 20)   I am having Eugene C.  Christin Bautista maintain his aspirin, fish oil-omega-3 fatty acids, cholecalciferol, triamcinolone cream, glipiZIDE, FreeStyle Libre 3 Sensor, amLODipine, metFORMIN, metoprolol succinate, rosuvastatin, dapagliflozin propanediol, tirzepatide, and valsartan-hydrochlorothiazide.  No orders of the defined types were placed in this encounter.    Follow-up: No follow-ups on file.  Mechele Claude, M.D.

## 2023-05-06 LAB — CMP14+EGFR
ALT: 23 [IU]/L (ref 0–44)
AST: 19 [IU]/L (ref 0–40)
Albumin: 4.5 g/dL (ref 3.9–4.9)
Alkaline Phosphatase: 78 [IU]/L (ref 44–121)
BUN/Creatinine Ratio: 20 (ref 10–24)
BUN: 28 mg/dL — ABNORMAL HIGH (ref 8–27)
Bilirubin Total: 0.9 mg/dL (ref 0.0–1.2)
CO2: 24 mmol/L (ref 20–29)
Calcium: 9.4 mg/dL (ref 8.6–10.2)
Chloride: 99 mmol/L (ref 96–106)
Creatinine, Ser: 1.4 mg/dL — ABNORMAL HIGH (ref 0.76–1.27)
Globulin, Total: 2.4 g/dL (ref 1.5–4.5)
Glucose: 133 mg/dL — ABNORMAL HIGH (ref 70–99)
Potassium: 4.8 mmol/L (ref 3.5–5.2)
Sodium: 138 mmol/L (ref 134–144)
Total Protein: 6.9 g/dL (ref 6.0–8.5)
eGFR: 55 mL/min/{1.73_m2} — ABNORMAL LOW (ref >59)

## 2023-05-06 LAB — CBC WITH DIFFERENTIAL/PLATELET
Basophils Absolute: 0 10*3/uL (ref 0.0–0.2)
Basos: 0 %
EOS (ABSOLUTE): 0.4 10*3/uL (ref 0.0–0.4)
Eos: 5 %
Hematocrit: 47.6 % (ref 37.5–51.0)
Hemoglobin: 16 g/dL (ref 13.0–17.7)
Immature Grans (Abs): 0 10*3/uL (ref 0.0–0.1)
Immature Granulocytes: 0 %
Lymphocytes Absolute: 1.7 10*3/uL (ref 0.7–3.1)
Lymphs: 22 %
MCH: 30.6 pg (ref 26.6–33.0)
MCHC: 33.6 g/dL (ref 31.5–35.7)
MCV: 91 fL (ref 79–97)
Monocytes Absolute: 0.9 10*3/uL (ref 0.1–0.9)
Monocytes: 11 %
Neutrophils Absolute: 4.8 10*3/uL (ref 1.4–7.0)
Neutrophils: 62 %
Platelets: 177 10*3/uL (ref 150–450)
RBC: 5.23 x10E6/uL (ref 4.14–5.80)
RDW: 12.8 % (ref 11.6–15.4)
WBC: 7.8 10*3/uL (ref 3.4–10.8)

## 2023-05-06 LAB — LIPID PANEL
Cholesterol, Total: 97 mg/dL — ABNORMAL LOW (ref 100–199)
HDL: 41 mg/dL (ref >39)
LDL CALC COMMENT:: 2.4 ratio (ref 0.0–5.0)
LDL Chol Calc (NIH): 31 mg/dL (ref 0–99)
Triglycerides: 144 mg/dL (ref 0–149)
VLDL Cholesterol Cal: 25 mg/dL (ref 5–40)

## 2023-05-06 LAB — MICROALBUMIN / CREATININE URINE RATIO
Creatinine, Urine: 80.7 mg/dL
Microalb/Creat Ratio: 10 mg/g{creat} (ref 0–29)
Microalbumin, Urine: 8 ug/mL

## 2023-05-09 ENCOUNTER — Encounter: Payer: Self-pay | Admitting: Family Medicine

## 2023-05-27 ENCOUNTER — Other Ambulatory Visit: Payer: Self-pay | Admitting: Family Medicine

## 2023-06-09 ENCOUNTER — Other Ambulatory Visit (HOSPITAL_COMMUNITY): Payer: Self-pay

## 2023-07-31 ENCOUNTER — Other Ambulatory Visit: Payer: Self-pay | Admitting: Family Medicine

## 2023-07-31 DIAGNOSIS — Z794 Long term (current) use of insulin: Secondary | ICD-10-CM

## 2023-07-31 DIAGNOSIS — E1121 Type 2 diabetes mellitus with diabetic nephropathy: Secondary | ICD-10-CM

## 2023-07-31 DIAGNOSIS — I10 Essential (primary) hypertension: Secondary | ICD-10-CM

## 2023-08-03 ENCOUNTER — Ambulatory Visit (INDEPENDENT_AMBULATORY_CARE_PROVIDER_SITE_OTHER): Payer: PPO | Admitting: Family Medicine

## 2023-08-03 ENCOUNTER — Ambulatory Visit: Payer: Self-pay | Admitting: Family Medicine

## 2023-08-03 VITALS — BP 126/74 | HR 72 | Temp 97.7°F | Ht 72.0 in | Wt 263.4 lb

## 2023-08-03 DIAGNOSIS — Z794 Long term (current) use of insulin: Secondary | ICD-10-CM | POA: Diagnosis not present

## 2023-08-03 DIAGNOSIS — E1169 Type 2 diabetes mellitus with other specified complication: Secondary | ICD-10-CM

## 2023-08-03 DIAGNOSIS — E291 Testicular hypofunction: Secondary | ICD-10-CM | POA: Diagnosis not present

## 2023-08-03 DIAGNOSIS — I10 Essential (primary) hypertension: Secondary | ICD-10-CM

## 2023-08-03 DIAGNOSIS — E785 Hyperlipidemia, unspecified: Secondary | ICD-10-CM | POA: Diagnosis not present

## 2023-08-03 DIAGNOSIS — S39012A Strain of muscle, fascia and tendon of lower back, initial encounter: Secondary | ICD-10-CM

## 2023-08-03 DIAGNOSIS — E1121 Type 2 diabetes mellitus with diabetic nephropathy: Secondary | ICD-10-CM | POA: Diagnosis not present

## 2023-08-03 DIAGNOSIS — Z23 Encounter for immunization: Secondary | ICD-10-CM | POA: Diagnosis not present

## 2023-08-03 LAB — BAYER DCA HB A1C WAIVED: HB A1C (BAYER DCA - WAIVED): 6.1 % — ABNORMAL HIGH (ref 4.8–5.6)

## 2023-08-03 MED ORDER — GLIPIZIDE 10 MG PO TABS: 10.0000 mg | ORAL_TABLET | Freq: Every morning | ORAL | 3 refills | Status: DC

## 2023-08-03 MED ORDER — METFORMIN HCL 1000 MG PO TABS
1000.0000 mg | ORAL_TABLET | Freq: Two times a day (BID) | ORAL | 3 refills | Status: AC
Start: 2023-08-03 — End: ?

## 2023-08-03 MED ORDER — DAPAGLIFLOZIN PROPANEDIOL 10 MG PO TABS: 10.0000 mg | ORAL_TABLET | Freq: Every day | ORAL | 3 refills | Status: AC

## 2023-08-03 NOTE — Progress Notes (Signed)
 Subjective:  Patient ID: Eugene Bautista,  male    DOB: [birth year]  Age: 68 y.o.    CC: Medical Management of Chronic Issues (No concerns )   HPI Eugene Bautista presents for  follow-up of hypertension. Patient has no history of headache chest pain or shortness of breath or recent cough. Patient also denies symptoms of TIA such as numbness weakness lateralizing. Patient denies side effects from medication. States taking it regularly.  Patient also  in for follow-up of elevated cholesterol. Doing well without complaints on current medication. Denies side effects  including myalgia and arthralgia and nausea. Also in today for liver function testing. Currently no chest pain, shortness of breath or other cardiovascular related symptoms noted.  Follow-up of diabetes. Patient does check blood sugar at home. Readings getting better, run between 110-140 fasting and 180-200PP Patient denies symptoms such as excessive hunger or urinary frequency, excessive hunger, nausea No significant hypoglycemic spells noted. Medications reviewed. Pt reports taking them regularly. Pt. denies complication/adverse reaction today.    History Eugene Bautista has a past medical history of Carotid artery occlusion, Diabetes mellitus without complication (HCC), Hyperlipidemia, Hypertension, Left carotid bruit, and Obesity.   Eugene Bautista has a past surgical history that includes Ruptured Aorta Repair (1989); Urinary surgery (1989); and Fracture surgery (Left, 1989).   His family history includes Aneurysm (age of onset: 8) in his sister; Cancer in his brother; Dementia in his mother; Diabetes in his father; Healthy in his daughter; Heart attack (age of onset: 67) in his father; Heart disease in his father; Kidney disease in his father.Eugene Bautista reports that Eugene Bautista quit smoking about 22 years ago. His smoking use included cigarettes. Eugene Bautista started smoking about 47 years ago. Eugene Bautista has a 43.8 pack-year smoking history. Eugene Bautista has never used smokeless  tobacco. Eugene Bautista reports current alcohol use of about 6.0 standard drinks of alcohol per week. Eugene Bautista reports that Eugene Bautista does not use drugs.  Current Outpatient Medications on File Prior to Visit  Medication Sig Dispense Refill   amLODipine  (NORVASC ) 10 MG tablet TAKE 1 TABLET BY MOUTH EVERY DAY 90 tablet 0   aspirin 81 MG tablet Take 81 mg by mouth daily.     cholecalciferol (VITAMIN D ) 1000 UNITS tablet Take 2,000 Units by mouth daily.     Continuous Glucose Sensor (FREESTYLE LIBRE 3 SENSOR) MISC Place 1 sensor on the skin every 14 days. Use to check glucose continuously 2 each 11   fish oil-omega-3 fatty acids 1000 MG capsule Take 1 g by mouth 2 (two) times daily.     metoprolol  succinate (TOPROL -XL) 25 MG 24 hr tablet TAKE 1 TABLET (25 MG TOTAL) BY MOUTH DAILY. 90 tablet 0   rosuvastatin  (CRESTOR ) 10 MG tablet TAKE 1 TABLET BY MOUTH EVERYDAY AT BEDTIME 90 tablet 0   tirzepatide  (MOUNJARO ) 15 MG/0.5ML Pen Inject 15 mg into the skin once a week. 6 mL 3   traZODone  (DESYREL ) 150 MG tablet USE FROM 1/3 TO 1 TABLET NIGHTLY AS NEEDED FOR SLEEP. 90 tablet 1   triamcinolone  cream (KENALOG ) 0.1 % Apply 1 application topically 3 (three) times daily. Avoid face and genitalia 45 g 0   valsartan -hydrochlorothiazide  (DIOVAN -HCT) 160-12.5 MG tablet Take 1 tablet by mouth daily. 90 tablet 3   No current facility-administered medications on file prior to visit.    ROS Review of Systems  Constitutional:  Negative for fever.  Respiratory:  Negative for shortness of breath.   Cardiovascular:  Negative for chest pain.  Musculoskeletal:  Positive  for back pain (mild catch at left lateral area.). Negative for arthralgias.  Skin:  Negative for rash.    Objective:  BP 126/74   Pulse 72   Temp 97.7 F (36.5 C) (Temporal)   Ht 6' (1.829 m)   Wt 263 lb 6.4 oz (119.5 kg)   SpO2 94%   BMI 35.72 kg/m   BP Readings from Last 3 Encounters:  08/03/23 126/74  05/05/23 113/65  03/14/23 (!) 114/56    Wt Readings  from Last 3 Encounters:  08/03/23 263 lb 6.4 oz (119.5 kg)  05/05/23 270 lb (122.5 kg)  03/14/23 277 lb (125.6 kg)    Lab Results  Component Value Date   HGBA1C 6.2 (H) 05/05/2023   HGBA1C 6.9 (H) 01/03/2023   HGBA1C 6.9 (H) 09/13/2022    Physical Exam Vitals reviewed.  Constitutional:      Appearance: Eugene Bautista is well-developed.  HENT:     Head: Normocephalic and atraumatic.     Right Ear: External ear normal.     Left Ear: External ear normal.     Mouth/Throat:     Pharynx: No oropharyngeal exudate or posterior oropharyngeal erythema.  Eyes:     Pupils: Pupils are equal, round, and reactive to light.  Cardiovascular:     Rate and Rhythm: Normal rate and regular rhythm.     Heart sounds: No murmur heard. Pulmonary:     Effort: No respiratory distress.     Breath sounds: Normal breath sounds.  Musculoskeletal:        General: Tenderness (left lumbar 4, paraspinous muscles) present.     Cervical back: Normal range of motion and neck supple.  Neurological:     Mental Status: Eugene Bautista is alert and oriented to person, place, and time.         Assessment & Plan:  Type 2 diabetes mellitus with diabetic nephropathy, with long-term current use of insulin (HCC) -     CBC with Differential/Platelet -     Bayer DCA Hb A1c Waived -     Dapagliflozin  Propanediol; Take 1 tablet (10 mg total) by mouth daily before breakfast.  Dispense: 90 tablet; Refill: 3 -     glipiZIDE ; Take 1 tablet (10 mg total) by mouth every morning.  Dispense: 90 tablet; Refill: 3 -     metFORMIN  HCl; Take 1 tablet (1,000 mg total) by mouth 2 (two) times daily with a meal.  Dispense: 180 tablet; Refill: 3  Essential hypertension -     CMP14+EGFR  Dyslipidemia associated with type 2 diabetes mellitus (HCC) -     Lipid panel  Hypogonadism, testicular -     Testosterone ,Free and Total  Strain of lumbar region, initial encounter  Other orders -     Tdap vaccine greater than or equal to 7yo IM   Heat, rest  stretching for back Follow-up: Return in about 3 months (around 11/03/2023).  Roise Cleaver, M.D.

## 2023-08-04 LAB — CMP14+EGFR
ALT: 22 IU/L (ref 0–44)
AST: 17 IU/L (ref 0–40)
Albumin: 4.4 g/dL (ref 3.9–4.9)
Alkaline Phosphatase: 80 IU/L (ref 44–121)
BUN/Creatinine Ratio: 24 (ref 10–24)
BUN: 30 mg/dL — ABNORMAL HIGH (ref 8–27)
Bilirubin Total: 0.7 mg/dL (ref 0.0–1.2)
CO2: 22 mmol/L (ref 20–29)
Calcium: 9.7 mg/dL (ref 8.6–10.2)
Chloride: 96 mmol/L (ref 96–106)
Creatinine, Ser: 1.23 mg/dL (ref 0.76–1.27)
Globulin, Total: 2.4 g/dL (ref 1.5–4.5)
Glucose: 157 mg/dL — ABNORMAL HIGH (ref 70–99)
Potassium: 4.5 mmol/L (ref 3.5–5.2)
Sodium: 138 mmol/L (ref 134–144)
Total Protein: 6.8 g/dL (ref 6.0–8.5)
eGFR: 65 mL/min/{1.73_m2} (ref >59)

## 2023-08-04 LAB — CBC WITH DIFFERENTIAL/PLATELET
Basophils Absolute: 0.1 10*3/uL (ref 0.0–0.2)
Basos: 1 %
EOS (ABSOLUTE): 0.3 10*3/uL (ref 0.0–0.4)
Eos: 4 %
Hematocrit: 51.9 % — ABNORMAL HIGH (ref 37.5–51.0)
Hemoglobin: 16.6 g/dL (ref 13.0–17.7)
Immature Grans (Abs): 0 10*3/uL (ref 0.0–0.1)
Immature Granulocytes: 0 %
Lymphocytes Absolute: 1.4 10*3/uL (ref 0.7–3.1)
Lymphs: 19 %
MCH: 29.7 pg (ref 26.6–33.0)
MCHC: 32 g/dL (ref 31.5–35.7)
MCV: 93 fL (ref 79–97)
Monocytes Absolute: 0.8 10*3/uL (ref 0.1–0.9)
Monocytes: 11 %
Neutrophils Absolute: 4.8 10*3/uL (ref 1.4–7.0)
Neutrophils: 65 %
Platelets: 186 10*3/uL (ref 150–450)
RBC: 5.58 x10E6/uL (ref 4.14–5.80)
RDW: 13.1 % (ref 11.6–15.4)
WBC: 7.4 10*3/uL (ref 3.4–10.8)

## 2023-08-04 LAB — LIPID PANEL
Chol/HDL Ratio: 2.3 ratio (ref 0.0–5.0)
Cholesterol, Total: 106 mg/dL (ref 100–199)
HDL: 46 mg/dL (ref >39)
LDL Chol Calc (NIH): 37 mg/dL (ref 0–99)
Triglycerides: 133 mg/dL (ref 0–149)
VLDL Cholesterol Cal: 23 mg/dL (ref 5–40)

## 2023-08-04 LAB — TESTOSTERONE,FREE AND TOTAL
Testosterone, Free: 15.9 pg/mL (ref 6.6–18.1)
Testosterone: 284 ng/dL (ref 264–916)

## 2023-08-05 NOTE — Progress Notes (Signed)
 Hello Freeland,  Your lab result is normal and/or stable.Some minor variations that are not significant are commonly marked abnormal, but do not represent any medical problem for you.  Best regards, Mechele Claude, M.D.

## 2023-08-18 ENCOUNTER — Encounter: Payer: Self-pay | Admitting: Family Medicine

## 2023-10-28 ENCOUNTER — Other Ambulatory Visit: Payer: Self-pay | Admitting: Family Medicine

## 2023-10-28 DIAGNOSIS — I10 Essential (primary) hypertension: Secondary | ICD-10-CM

## 2023-11-03 ENCOUNTER — Encounter: Payer: Self-pay | Admitting: Family Medicine

## 2023-11-03 ENCOUNTER — Ambulatory Visit (INDEPENDENT_AMBULATORY_CARE_PROVIDER_SITE_OTHER)

## 2023-11-03 ENCOUNTER — Ambulatory Visit (INDEPENDENT_AMBULATORY_CARE_PROVIDER_SITE_OTHER): Admitting: Family Medicine

## 2023-11-03 ENCOUNTER — Ambulatory Visit: Payer: Self-pay | Admitting: Family Medicine

## 2023-11-03 VITALS — BP 117/64 | HR 71 | Temp 98.2°F | Ht 72.0 in | Wt 266.0 lb

## 2023-11-03 DIAGNOSIS — E785 Hyperlipidemia, unspecified: Secondary | ICD-10-CM | POA: Diagnosis not present

## 2023-11-03 DIAGNOSIS — M25552 Pain in left hip: Secondary | ICD-10-CM | POA: Diagnosis not present

## 2023-11-03 DIAGNOSIS — M1612 Unilateral primary osteoarthritis, left hip: Secondary | ICD-10-CM | POA: Diagnosis not present

## 2023-11-03 DIAGNOSIS — E119 Type 2 diabetes mellitus without complications: Secondary | ICD-10-CM | POA: Diagnosis not present

## 2023-11-03 DIAGNOSIS — M25559 Pain in unspecified hip: Secondary | ICD-10-CM | POA: Diagnosis not present

## 2023-11-03 DIAGNOSIS — E1121 Type 2 diabetes mellitus with diabetic nephropathy: Secondary | ICD-10-CM

## 2023-11-03 DIAGNOSIS — Z7985 Long-term (current) use of injectable non-insulin antidiabetic drugs: Secondary | ICD-10-CM | POA: Diagnosis not present

## 2023-11-03 DIAGNOSIS — I10 Essential (primary) hypertension: Secondary | ICD-10-CM | POA: Diagnosis not present

## 2023-11-03 DIAGNOSIS — E1169 Type 2 diabetes mellitus with other specified complication: Secondary | ICD-10-CM

## 2023-11-03 LAB — BAYER DCA HB A1C WAIVED: HB A1C (BAYER DCA - WAIVED): 6.3 % — ABNORMAL HIGH (ref 4.8–5.6)

## 2023-11-03 MED ORDER — METOPROLOL SUCCINATE ER 25 MG PO TB24: 25.0000 mg | ORAL_TABLET | Freq: Every day | ORAL | Status: AC

## 2023-11-03 MED ORDER — TRAZODONE HCL 150 MG PO TABS: ORAL_TABLET | ORAL | 1 refills | Status: AC

## 2023-11-03 MED ORDER — ROSUVASTATIN CALCIUM 10 MG PO TABS: 10.0000 mg | ORAL_TABLET | Freq: Every day | ORAL | 3 refills | Status: AC
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
## 2023-11-04 LAB — CBC WITH DIFFERENTIAL/PLATELET
Basophils Absolute: 0 x10E3/uL (ref 0.0–0.2)
Basos: 0 %
EOS (ABSOLUTE): 0.2 x10E3/uL (ref 0.0–0.4)
Eos: 3 %
Hematocrit: 50.7 % (ref 37.5–51.0)
Hemoglobin: 16.6 g/dL (ref 13.0–17.7)
Immature Grans (Abs): 0 x10E3/uL (ref 0.0–0.1)
Immature Granulocytes: 0 %
Lymphocytes Absolute: 1.3 x10E3/uL (ref 0.7–3.1)
Lymphs: 18 %
MCH: 29.9 pg (ref 26.6–33.0)
MCHC: 32.7 g/dL (ref 31.5–35.7)
MCV: 91 fL (ref 79–97)
Monocytes Absolute: 0.9 x10E3/uL (ref 0.1–0.9)
Monocytes: 12 %
Neutrophils Absolute: 4.8 x10E3/uL (ref 1.4–7.0)
Neutrophils: 67 %
Platelets: 205 x10E3/uL (ref 150–450)
RBC: 5.55 x10E6/uL (ref 4.14–5.80)
RDW: 13.2 % (ref 11.6–15.4)
WBC: 7.2 x10E3/uL (ref 3.4–10.8)

## 2023-11-04 LAB — LIPID PANEL
Chol/HDL Ratio: 2.6 ratio (ref 0.0–5.0)
Cholesterol, Total: 118 mg/dL (ref 100–199)
HDL: 46 mg/dL (ref >39)
LDL Chol Calc (NIH): 49 mg/dL (ref 0–99)
Triglycerides: 134 mg/dL (ref 0–149)
VLDL Cholesterol Cal: 23 mg/dL (ref 5–40)

## 2023-11-04 LAB — CMP14+EGFR
ALT: 23 IU/L (ref 0–44)
AST: 19 IU/L (ref 0–40)
Albumin: 4.5 g/dL (ref 3.9–4.9)
Alkaline Phosphatase: 83 IU/L (ref 44–121)
BUN/Creatinine Ratio: 20 (ref 10–24)
BUN: 24 mg/dL (ref 8–27)
Bilirubin Total: 0.9 mg/dL (ref 0.0–1.2)
CO2: 22 mmol/L (ref 20–29)
Calcium: 9.7 mg/dL (ref 8.6–10.2)
Chloride: 98 mmol/L (ref 96–106)
Creatinine, Ser: 1.22 mg/dL (ref 0.76–1.27)
Globulin, Total: 2.5 g/dL (ref 1.5–4.5)
Glucose: 117 mg/dL — ABNORMAL HIGH (ref 70–99)
Potassium: 4.6 mmol/L (ref 3.5–5.2)
Sodium: 138 mmol/L (ref 134–144)
Total Protein: 7 g/dL (ref 6.0–8.5)
eGFR: 65 mL/min/1.73 (ref >59)

## 2023-11-11 ENCOUNTER — Telehealth: Payer: Self-pay | Admitting: Family Medicine

## 2023-11-11 NOTE — Telephone Encounter (Signed)
 Copied from CRM [id number]. Topic: Clinical - Lab/Test Results >> Nov 11, 2023  2:58 PM Kevelyn M wrote: Reason for CRM: Patient calling back in for X-ray results.

## 2023-11-11 NOTE — Telephone Encounter (Signed)
 Results are not back yet. Left message making pt aware and to call back if needed.

## 2023-11-14 NOTE — Progress Notes (Signed)
 Hello Freeland,  Your lab result is normal and/or stable.Some minor variations that are not significant are commonly marked abnormal, but do not represent any medical problem for you.  Best regards, Mechele Claude, M.D.

## 2023-12-16 ENCOUNTER — Other Ambulatory Visit: Payer: Self-pay | Admitting: Family Medicine

## 2024-01-23 ENCOUNTER — Other Ambulatory Visit: Payer: Self-pay | Admitting: *Deleted

## 2024-01-23 DIAGNOSIS — I10 Essential (primary) hypertension: Secondary | ICD-10-CM

## 2024-02-07 ENCOUNTER — Ambulatory Visit: Payer: Self-pay | Admitting: Family Medicine

## 2024-02-07 ENCOUNTER — Encounter: Payer: Self-pay | Admitting: Family Medicine

## 2024-02-07 VITALS — BP 108/58 | HR 69 | Temp 97.5°F | Ht 72.0 in | Wt 267.0 lb

## 2024-02-07 DIAGNOSIS — M25552 Pain in left hip: Secondary | ICD-10-CM

## 2024-02-07 DIAGNOSIS — E1169 Type 2 diabetes mellitus with other specified complication: Secondary | ICD-10-CM

## 2024-02-07 DIAGNOSIS — E785 Hyperlipidemia, unspecified: Secondary | ICD-10-CM | POA: Diagnosis not present

## 2024-02-07 DIAGNOSIS — I10 Essential (primary) hypertension: Secondary | ICD-10-CM

## 2024-02-07 DIAGNOSIS — Z7984 Long term (current) use of oral hypoglycemic drugs: Secondary | ICD-10-CM | POA: Diagnosis not present

## 2024-02-07 DIAGNOSIS — Z7985 Long-term (current) use of injectable non-insulin antidiabetic drugs: Secondary | ICD-10-CM | POA: Diagnosis not present

## 2024-02-07 DIAGNOSIS — E1121 Type 2 diabetes mellitus with diabetic nephropathy: Secondary | ICD-10-CM | POA: Diagnosis not present

## 2024-02-07 LAB — CBC WITH DIFFERENTIAL/PLATELET
Basophils Absolute: 0 x10E3/uL (ref 0.0–0.2)
Basos: 1 %
EOS (ABSOLUTE): 0.2 x10E3/uL (ref 0.0–0.4)
Eos: 3 %
Hematocrit: 50.4 % (ref 37.5–51.0)
Hemoglobin: 16.7 g/dL (ref 13.0–17.7)
Immature Grans (Abs): 0 x10E3/uL (ref 0.0–0.1)
Immature Granulocytes: 0 %
Lymphocytes Absolute: 1.2 x10E3/uL (ref 0.7–3.1)
Lymphs: 16 %
MCH: 30.5 pg (ref 26.6–33.0)
MCHC: 33.1 g/dL (ref 31.5–35.7)
MCV: 92 fL (ref 79–97)
Monocytes Absolute: 0.8 x10E3/uL (ref 0.1–0.9)
Monocytes: 11 %
Neutrophils Absolute: 5.2 x10E3/uL (ref 1.4–7.0)
Neutrophils: 69 %
Platelets: 198 x10E3/uL (ref 150–450)
RBC: 5.48 x10E6/uL (ref 4.14–5.80)
RDW: 12.9 % (ref 11.6–15.4)
WBC: 7.4 x10E3/uL (ref 3.4–10.8)

## 2024-02-07 LAB — LIPID PANEL
Chol/HDL Ratio: 2.6 ratio (ref 0.0–5.0)
Cholesterol, Total: 110 mg/dL (ref 100–199)
HDL: 43 mg/dL (ref >39)
LDL Chol Calc (NIH): 33 mg/dL (ref 0–99)
Triglycerides: 218 mg/dL — ABNORMAL HIGH (ref 0–149)
VLDL Cholesterol Cal: 34 mg/dL (ref 5–40)

## 2024-02-07 LAB — COMPREHENSIVE METABOLIC PANEL WITH GFR
ALT: 27 IU/L (ref 0–44)
AST: 18 IU/L (ref 0–40)
Albumin: 4.3 g/dL (ref 3.9–4.9)
Alkaline Phosphatase: 78 IU/L (ref 47–123)
BUN/Creatinine Ratio: 20 (ref 10–24)
BUN: 30 mg/dL — ABNORMAL HIGH (ref 8–27)
Bilirubin Total: 0.7 mg/dL (ref 0.0–1.2)
CO2: 21 mmol/L (ref 20–29)
Calcium: 9.6 mg/dL (ref 8.6–10.2)
Chloride: 98 mmol/L (ref 96–106)
Creatinine, Ser: 1.5 mg/dL — ABNORMAL HIGH (ref 0.76–1.27)
Globulin, Total: 2.4 g/dL (ref 1.5–4.5)
Glucose: 186 mg/dL — ABNORMAL HIGH (ref 70–99)
Potassium: 4.5 mmol/L (ref 3.5–5.2)
Sodium: 137 mmol/L (ref 134–144)
Total Protein: 6.7 g/dL (ref 6.0–8.5)
eGFR: 51 mL/min/1.73 — ABNORMAL LOW (ref >59)

## 2024-02-07 LAB — BAYER DCA HB A1C WAIVED: HB A1C (BAYER DCA - WAIVED): 6.1 % — ABNORMAL HIGH (ref 4.8–5.6)

## 2024-02-07 MED ORDER — NABUMETONE 500 MG PO TABS: 1000.0000 mg | ORAL_TABLET | Freq: Two times a day (BID) | ORAL | 1 refills | Status: AC

## 2024-02-07 MED ORDER — VALSARTAN-HYDROCHLOROTHIAZIDE 160-12.5 MG PO TABS: 1.0000 | ORAL_TABLET | Freq: Every day | ORAL | 3 refills | Status: AC
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
## 2024-02-08 LAB — MICROALBUMIN / CREATININE URINE RATIO
Creatinine, Urine: 93.2 mg/dL
Microalb/Creat Ratio: 8 mg/g{creat} (ref 0–29)
Microalbumin, Urine: 7.3 ug/mL

## 2024-02-08 NOTE — Progress Notes (Signed)
 Hello Freeland,  Your lab result is normal and/or stable.Some minor variations that are not significant are commonly marked abnormal, but do not represent any medical problem for you.  Best regards, Mechele Claude, M.D.

## 2024-02-22 ENCOUNTER — Telehealth: Payer: Self-pay | Admitting: Family Medicine

## 2024-02-22 ENCOUNTER — Other Ambulatory Visit: Payer: Self-pay | Admitting: Family Medicine

## 2024-02-22 ENCOUNTER — Other Ambulatory Visit

## 2024-02-22 DIAGNOSIS — E1121 Type 2 diabetes mellitus with diabetic nephropathy: Secondary | ICD-10-CM

## 2024-02-22 DIAGNOSIS — M25552 Pain in left hip: Secondary | ICD-10-CM

## 2024-02-22 DIAGNOSIS — E291 Testicular hypofunction: Secondary | ICD-10-CM

## 2024-02-22 DIAGNOSIS — I10 Essential (primary) hypertension: Secondary | ICD-10-CM

## 2024-02-22 NOTE — Telephone Encounter (Signed)
 REFERRAL REQUEST Telephone Note  Have you been seen at our office for this problem? yes (Advise that they may need an appointment with their PCP before a referral can be done)  Reason for Referral: left hip pain Referral discussed with patient: pt said dr stacks was suppose to send referral in at last visit  Best contact number of patient for referral team: [phone]    Has patient been seen by a specialist for this issue before: no  Patient provider preference for referral: na Patient location preference for referral: na   Patient notified that referrals can take up to a week or longer to process. If they haven't heard anything within a week they should call back and speak with the referral department.

## 2024-02-22 NOTE — Telephone Encounter (Signed)
Pt notified.    LS

## 2024-02-22 NOTE — Telephone Encounter (Signed)
Referral placed, as requested WS 

## 2024-02-23 LAB — TESTOSTERONE,FREE AND TOTAL
Testosterone, Free: 13.6 pg/mL (ref 6.6–18.1)
Testosterone: 191 ng/dL — ABNORMAL LOW (ref 264–916)

## 2024-02-23 LAB — BMP8+EGFR
BUN/Creatinine Ratio: 21 (ref 10–24)
BUN: 32 mg/dL — ABNORMAL HIGH (ref 8–27)
CO2: 26 mmol/L (ref 20–29)
Calcium: 9.5 mg/dL (ref 8.6–10.2)
Chloride: 94 mmol/L — ABNORMAL LOW (ref 96–106)
Creatinine, Ser: 1.5 mg/dL — ABNORMAL HIGH (ref 0.76–1.27)
Glucose: 168 mg/dL — ABNORMAL HIGH (ref 70–99)
Potassium: 4.4 mmol/L (ref 3.5–5.2)
Sodium: 140 mmol/L (ref 134–144)
eGFR: 51 mL/min/1.73 — ABNORMAL LOW (ref >59)

## 2024-02-27 NOTE — Progress Notes (Signed)
 Pt declined and said its low cause he is not taking his meds. Does not want any medication at this time.

## 2024-03-21 ENCOUNTER — Ambulatory Visit: Admitting: Orthopaedic Surgery

## 2024-03-21 VITALS — Ht 71.0 in | Wt 268.2 lb

## 2024-03-21 DIAGNOSIS — M1612 Unilateral primary osteoarthritis, left hip: Secondary | ICD-10-CM | POA: Insufficient documentation
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
## 2024-04-03 ENCOUNTER — Ambulatory Visit

## 2024-04-05 ENCOUNTER — Other Ambulatory Visit: Payer: Self-pay | Admitting: Physician Assistant

## 2024-04-05 DIAGNOSIS — Z01818 Encounter for other preprocedural examination: Secondary | ICD-10-CM

## 2024-04-09 ENCOUNTER — Other Ambulatory Visit (HOSPITAL_COMMUNITY): Payer: Self-pay

## 2024-04-09 ENCOUNTER — Telehealth: Payer: Self-pay | Admitting: Pharmacy Technician

## 2024-04-10 ENCOUNTER — Other Ambulatory Visit (HOSPITAL_COMMUNITY): Payer: Self-pay

## 2024-04-10 NOTE — Telephone Encounter (Signed)
 Pharmacy Patient Advocate Encounter  Received notification from [employer] that Prior Authorization for FreeStyle Libre 3 Plus Sensor has been APPROVED from 03/08/2024 to 03/07/2025. Ran test claim, Copay is $0.00. This test claim was processed through [facility]- copay amounts may vary at other pharmacies due to pharmacy/plan contracts, or as the patient moves through the different stages of their insurance plan.   PA #/Case ID/Reference #: [id number]

## 2024-04-12 NOTE — Pre-Procedure Instructions (Addendum)
 Surgical Instructions   Your procedure is scheduled on April 17, 2024. Report to [facility] Main Entrance A at 8:00 A.M., then check in with the Admitting office. Any questions or running late day of surgery: call [phone]  Questions prior to your surgery date: call [phone], Monday-Friday, 8am-4pm. If you experience any cold or flu symptoms such as cough, fever, chills, shortness of breath, etc. between now and your scheduled surgery, please notify us  at the above number.     Remember:  Do not eat after midnight the night before your surgery  You may drink clear liquids until 7:00 AM the morning of your surgery.   Clear liquids allowed are: Water, Non-Citrus Juices (without pulp), Carbonated Beverages, Clear Tea (no milk, honey, etc.), Black Coffee Only (NO MILK, CREAM OR POWDERED CREAMER of any kind), and Gatorade.  Patient Instructions  The night before surgery:  No food after midnight. ONLY clear liquids after midnight  The day of surgery (if you have diabetes): Drink ONE (1) 12 oz G2 given to you in your pre admission testing appointment by 7:30 AM the morning of surgery. Drink in one sitting. Do not sip.  This drink was given to you during your hospital  pre-op appointment visit.  Nothing else to drink after completing the  12 oz bottle of G2.         If you have questions, please contact your surgeons office.    Take these medicines the morning of surgery with A SIP OF WATER: amLODipine  (NORVASC )  metoprolol  succinate (TOPROL -XL)  rosuvastatin  (CRESTOR )    Follow your surgeon's instructions on when to stop Aspirin.  If no instructions were given by your surgeon then you will need to call the office to get those instructions.     One week prior to surgery, STOP taking any Aleve, Naproxen, Ibuprofen, Motrin, Advil, Goody's, BC's, all herbal medications, fish oil-omega-3 fatty acids, and non-prescription vitamins. This includes your medication: nabumetone   (RELAFEN )    WHAT DO I DO ABOUT MY DIABETES MEDICATION?   Do not take glipiZIDE  (GLUCOTROL ) the evening before surgery or the morning of surgery.  Do not take metFORMIN  (GLUCOPHAGE ) the morning of surgery.      STOP taking your dapagliflozin  propanediol (FARXIGA ) three days prior to surgery. Your last dose will be February 6th.  STOP taking your tirzepatide  (MOUNJARO ) one week prior to surgery. Do not take any doses after February 2nd.   HOW TO MANAGE YOUR DIABETES BEFORE AND AFTER SURGERY  Why is it important to control my blood sugar before and after surgery? Improving blood sugar levels before and after surgery helps healing and can limit problems. A way of improving blood sugar control is eating a healthy diet by:  Eating less sugar and carbohydrates  Increasing activity/exercise  Talking with your doctor about reaching your blood sugar goals High blood sugars (greater than 180 mg/dL) can raise your risk of infections and slow your recovery, so you will need to focus on controlling your diabetes during the weeks before surgery. Make sure that the doctor who takes care of your diabetes knows about your planned surgery including the date and location.  How do I manage my blood sugar before surgery? Check your blood sugar at least 4 times a day, starting 2 days before surgery, to make sure that the level is not too high or low.  Check your blood sugar the morning of your surgery when you wake up and every 2 hours until you get to  the Short Stay unit.  If your blood sugar is less than 70 mg/dL, you will need to treat for low blood sugar: Do not take insulin. Treat a low blood sugar (less than 70 mg/dL) with  cup of clear juice (cranberry or apple), 4 glucose tablets, OR glucose gel. Recheck blood sugar in 15 minutes after treatment (to make sure it is greater than 70 mg/dL). If your blood sugar is not greater than 70 mg/dL on recheck, call [phone] for further  instructions. Report your blood sugar to the short stay nurse when you get to Short Stay.  If you are admitted to the hospital after surgery: Your blood sugar will be checked by the staff and you will probably be given insulin after surgery (instead of oral diabetes medicines) to make sure you have good blood sugar levels. The goal for blood sugar control after surgery is 80-180 mg/dL.                      Do NOT Smoke (Tobacco/Vaping) for 24 hours prior to your procedure.  If you use a CPAP at night, you may bring your mask/headgear for your overnight stay.   You will be asked to remove any contacts, glasses, piercing's, hearing aid's, dentures/partials prior to surgery. Please bring cases for these items if needed.    Your surgeon will determine if you are to be admitted or discharged the same day.  Patients discharged the day of surgery will not be allowed to drive home, and someone needs to stay with them for 24 hours.  SURGICAL WAITING ROOM VISITATION Patients may have no more than 2 support people in the waiting area - these visitors may rotate.   Pre-op nurse will coordinate an appropriate time for 2 ADULT support persons, who may not rotate, to accompany patient in pre-op.  Children under the age of 107 must have an adult with them who is not the patient and must remain in the main waiting area with an adult.  If the patient needs to stay at the hospital during part of their recovery, the visitor guidelines for inpatient rooms apply.  Please refer to the [facility] website for the visitor guidelines for any additional information.   If you received a COVID test during your pre-op visit  it is requested that you wear a mask when out in public, stay away from anyone that may not be feeling well and notify your surgeon if you develop symptoms. If you have been in contact with anyone that has tested positive in the last 10 days please notify you surgeon.      Pre-operative 4 CHG  Bathing Instructions   You can play a key role in reducing the risk of infection after surgery. Your skin needs to be as free of germs as possible. You can reduce the number of germs on your skin by washing with CHG (chlorhexidine gluconate) soap before surgery. CHG is an antiseptic soap that kills germs and continues to kill germs even after washing.   DO NOT use if you have an allergy to chlorhexidine/CHG or antibacterial soaps. If your skin becomes reddened or irritated, stop using the CHG and notify one of our RNs at [phone].   Please shower with the CHG soap starting 4 days before surgery using the following schedule:     Please keep in mind the following:  DO NOT shave, including legs and underarms, starting the day of your first shower.   You  may shave your face at any point before/day of surgery.  Place clean sheets on your bed the day you start using CHG soap. Use a clean washcloth (not used since being washed) for each shower. DO NOT sleep with pets once you start using the CHG.   CHG Shower Instructions:  Wash your face and private area with normal soap. If you choose to wash your hair, wash first with your normal shampoo.  After you use shampoo/soap, rinse your hair and body thoroughly to remove shampoo/soap residue.  Turn the water OFF and apply  bottle of CHG soap to a CLEAN washcloth.  Apply CHG soap ONLY FROM YOUR NECK DOWN TO YOUR TOES (washing for 3-5 minutes)  DO NOT use CHG soap on face, private areas, open wounds, or sores.  Pay special attention to the area where your surgery is being performed.  If you are having back surgery, having someone wash your back for you may be helpful. Wait 2 minutes after CHG soap is applied, then you may rinse off the CHG soap.  Pat dry with a clean towel  Put on clean clothes/pajamas   If you choose to wear lotion, please use ONLY the CHG-compatible lotions that are listed below.  Additional instructions for the day of  surgery:  If you choose, you may shower the morning of surgery with an antibacterial soap.  DO NOT APPLY any lotions, deodorants, cologne, or perfumes.   Do not bring valuables to the hospital. [facility] is not responsible for any belongings/valuables. Do not wear nail polish, gel polish, artificial nails, or any other type of covering on natural nails (fingers and toes) Do not wear jewelry or makeup Put on clean/comfortable clothes.  Please brush your teeth.  Ask your nurse before applying any prescription medications to the skin.     CHG Compatible Lotions   Aveeno Moisturizing lotion  Cetaphil Moisturizing Cream  Cetaphil Moisturizing Lotion  Clairol Herbal Essence Moisturizing Lotion, Dry Skin  Clairol Herbal Essence Moisturizing Lotion, Extra Dry Skin  Clairol Herbal Essence Moisturizing Lotion, Normal Skin  Curel Age Defying Therapeutic Moisturizing Lotion with Alpha Hydroxy  Curel Extreme Care Body Lotion  Curel Soothing Hands Moisturizing Hand Lotion  Curel Therapeutic Moisturizing Cream, Fragrance-Free  Curel Therapeutic Moisturizing Lotion, Fragrance-Free  Curel Therapeutic Moisturizing Lotion, Original Formula  Eucerin Daily Replenishing Lotion  Eucerin Dry Skin Therapy Plus Alpha Hydroxy Crme  Eucerin Dry Skin Therapy Plus Alpha Hydroxy Lotion  Eucerin Original Crme  Eucerin Original Lotion  Eucerin Plus Crme Eucerin Plus Lotion  Eucerin TriLipid Replenishing Lotion  Keri Anti-Bacterial Hand Lotion  Keri Deep Conditioning Original Lotion Dry Skin Formula Softly Scented  Keri Deep Conditioning Original Lotion, Fragrance Free Sensitive Skin Formula  Keri Lotion Fast Absorbing Fragrance Free Sensitive Skin Formula  Keri Lotion Fast Absorbing Softly Scented Dry Skin Formula  Keri Original Lotion  Keri Skin Renewal Lotion Keri Silky Smooth Lotion  Keri Silky Smooth Sensitive Skin Lotion  Nivea Body Creamy Conditioning Oil  Nivea Body Extra Enriched Lotion   Nivea Body Original Lotion  Nivea Body Sheer Moisturizing Lotion Nivea Crme  Nivea Skin Firming Lotion  NutraDerm 30 Skin Lotion  NutraDerm Skin Lotion  NutraDerm Therapeutic Skin Cream  NutraDerm Therapeutic Skin Lotion  ProShield Protective Hand Cream  Provon moisturizing lotion  Please read over the following fact sheets that you were given.

## 2024-04-12 NOTE — Care Plan (Signed)
 Ortho Bundle Case Management Note  Patient Details  Name: [name] MRN: [id number] Date of Birth: 02/04/1956  [facility] RNCM call to patient to discuss his upcoming Left total hip arthroplasty with Dr. Vernetta at [facility] on 04/17/24. He is agreeable to case management. He plans to return home with assistance from his wife. He does need a RW, which will be provided before discharge. Anticipate HHPT will be needed after a short hospital stay. Referral made to [facility] after choice provided. Reviewed post op care instructions and will continue to follow for needs.                DME Arranged:  Vannie rolling DME Agency:  [facility]  HH Arranged:  PT HH Agency:  [facility]  Additional Comments: Please contact me with any questions of if this plan should need to change.  Tylene Ned, RN, BSN, [employer]  [phone] 04/12/2024, 3:04 PM

## 2024-04-13 ENCOUNTER — Other Ambulatory Visit: Payer: Self-pay

## 2024-04-13 ENCOUNTER — Inpatient Hospital Stay (HOSPITAL_COMMUNITY): Admission: RE | Admit: 2024-04-13

## 2024-04-13 ENCOUNTER — Encounter (HOSPITAL_COMMUNITY): Payer: Self-pay

## 2024-04-13 VITALS — BP 150/76 | HR 73 | Temp 97.7°F | Resp 18 | Ht 71.0 in | Wt 267.0 lb

## 2024-04-13 DIAGNOSIS — Z01818 Encounter for other preprocedural examination: Secondary | ICD-10-CM

## 2024-04-13 LAB — BASIC METABOLIC PANEL WITH GFR
Anion gap: 13 (ref 5–15)
BUN: 32 mg/dL — ABNORMAL HIGH (ref 8–23)
CO2: 26 mmol/L (ref 22–32)
Calcium: 9.5 mg/dL (ref 8.9–10.3)
Chloride: 100 mmol/L (ref 98–111)
Creatinine, Ser: 1.4 mg/dL — ABNORMAL HIGH (ref 0.61–1.24)
GFR, Estimated: 55 mL/min — ABNORMAL LOW
Glucose, Bld: 186 mg/dL — ABNORMAL HIGH (ref 70–99)
Potassium: 4.6 mmol/L (ref 3.5–5.1)
Sodium: 139 mmol/L (ref 135–145)

## 2024-04-13 LAB — GLUCOSE, CAPILLARY: Glucose-Capillary: 170 mg/dL — ABNORMAL HIGH (ref 70–99)

## 2024-04-13 LAB — CBC
HCT: 47.9 % (ref 39.0–52.0)
Hemoglobin: 16.4 g/dL (ref 13.0–17.0)
MCH: 30.9 pg (ref 26.0–34.0)
MCHC: 34.2 g/dL (ref 30.0–36.0)
MCV: 90.2 fL (ref 80.0–100.0)
Platelets: 182 10*3/uL (ref 150–400)
RBC: 5.31 MIL/uL (ref 4.22–5.81)
RDW: 13.7 % (ref 11.5–15.5)
WBC: 8.1 10*3/uL (ref 4.0–10.5)
nRBC: 0 % (ref 0.0–0.2)

## 2024-04-13 LAB — SURGICAL PCR SCREEN
MRSA, PCR: NEGATIVE
Staphylococcus aureus: POSITIVE — AB

## 2024-04-13 LAB — TYPE AND SCREEN
ABO/RH(D): O POS
Antibody Screen: NEGATIVE

## 2024-04-13 LAB — HEMOGLOBIN A1C
Hgb A1c MFr Bld: 6.5 % — ABNORMAL HIGH (ref 4.8–5.6)
Mean Plasma Glucose: 139.85 mg/dL

## 2024-04-13 NOTE — Progress Notes (Signed)
 PCP - Dr. Butler Der Cardiologist - Denies  PPM/ICD - Denies Device Orders - n/a Rep Notified - n/a  Chest x-ray - n/a EKG - 04-13-24 Stress Test - 11-27-13 ECHO - denies Cardiac Cath - denies  Sleep Study - denies CPAP - n/a  Fasting Blood Sugar - 140-150 Checks Blood Sugar: wears the Freestyle Libre 3+ on left arm.  Last dose of GLP1 agonist-  tirzepatide  (MOUNJARO )  GLP1 instructions: hold for 7 day's prior to surgery. LD on 04-08-24  Blood Thinner Instructions: Denies Aspirin Instructions: per patient will hold starting 04-14-24  ERAS Protcol - clears until 0700 PRE-SURGERY Ensure or G2- g2  COVID TEST- n/a   Anesthesia review: yes, HTN w/new EKG, DM2  Patient denies shortness of breath, fever, cough and chest pain at PAT appointment. Patient denies any respiratory issues at this time.    All instructions explained to the patient, with a verbal understanding of the material. Patient agrees to go over the instructions while at home for a better understanding. Patient also instructed to self quarantine after being tested for COVID-19. The opportunity to ask questions was provided.

## 2024-04-17 ENCOUNTER — Encounter (HOSPITAL_COMMUNITY): Admission: RE | Payer: Self-pay | Source: Home / Self Care

## 2024-04-17 ENCOUNTER — Ambulatory Visit (HOSPITAL_COMMUNITY): Admission: RE | Admit: 2024-04-17 | Admitting: Orthopaedic Surgery

## 2024-04-30 ENCOUNTER — Encounter: Admitting: Orthopaedic Surgery

## 2024-05-08 ENCOUNTER — Ambulatory Visit

## 2024-05-24 ENCOUNTER — Ambulatory Visit: Admitting: Family Medicine
